# Patient Record
Sex: Female | Born: 1990 | Race: White | Hispanic: No | Smoking: Former smoker
Health system: Southern US, Community
[De-identification: ages and names within clinical notes are randomized; demographics above are authoritative.]

## PROBLEM LIST (undated history)

## (undated) DIAGNOSIS — F32A Depression, unspecified: Secondary | ICD-10-CM

## (undated) DIAGNOSIS — F419 Anxiety disorder, unspecified: Secondary | ICD-10-CM

## (undated) DIAGNOSIS — F329 Major depressive disorder, single episode, unspecified: Secondary | ICD-10-CM

## (undated) HISTORY — DX: Anxiety disorder, unspecified: F41.9

## (undated) HISTORY — DX: Major depressive disorder, single episode, unspecified: F32.9

## (undated) HISTORY — DX: Depression, unspecified: F32.A

---
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
## 2012-04-13 ENCOUNTER — Emergency Department: Payer: Self-pay | Admitting: Emergency Medicine

## 2012-04-13 LAB — URINALYSIS, COMPLETE
Bilirubin,UR: NEGATIVE
Blood: NEGATIVE
Glucose,UR: NEGATIVE mg/dL (ref 0–75)
Ketone: NEGATIVE
Nitrite: NEGATIVE
Ph: 6 (ref 4.5–8.0)
RBC,UR: <1 /HPF (ref 0–5)
WBC UR: <1 /HPF (ref 0–5)

## 2012-04-13 LAB — BASIC METABOLIC PANEL
Anion Gap: 7 (ref 7–16)
BUN: 8 mg/dL (ref 7–18)
Calcium, Total: 9.3 mg/dL (ref 8.5–10.1)
Chloride: 107 mmol/L (ref 98–107)
EGFR (Non-African Amer.): >60
Potassium: 4.2 mmol/L (ref 3.5–5.1)
Sodium: 140 mmol/L (ref 136–145)

## 2012-04-13 LAB — PREGNANCY, URINE: Pregnancy Test, Urine: NEGATIVE m[IU]/mL

## 2012-04-13 LAB — CBC
HCT: 43.4 % (ref 35.0–47.0)
MCV: 89 fL (ref 80–100)
Platelet: 253 10*3/uL (ref 150–440)
WBC: 5.7 10*3/uL (ref 3.6–11.0)

## 2014-06-06 DIAGNOSIS — Z789 Other specified health status: Secondary | ICD-10-CM | POA: Insufficient documentation

## 2014-10-23 ENCOUNTER — Observation Stay: Payer: Self-pay | Admitting: Obstetrics and Gynecology

## 2014-10-23 LAB — URINALYSIS, COMPLETE
BLOOD: NEGATIVE
Bilirubin,UR: NEGATIVE
GLUCOSE, UR: NEGATIVE mg/dL (ref 0–75)
KETONE: NEGATIVE
Leukocyte Esterase: NEGATIVE
Nitrite: NEGATIVE
PH: 6 (ref 4.5–8.0)
SPECIFIC GRAVITY: 1.025 (ref 1.003–1.030)
Squamous Epithelial: 2
WBC UR: 2 /HPF (ref 0–5)

## 2014-10-23 LAB — FETAL FIBRONECTIN
APPEARANCE: NORMAL
FETAL FIBRONECTIN: NEGATIVE

## 2014-12-20 ENCOUNTER — Inpatient Hospital Stay: Payer: Self-pay

## 2014-12-20 LAB — CBC WITH DIFFERENTIAL/PLATELET
BASOS ABS: 0 10*3/uL (ref 0.0–0.1)
BASOS PCT: 0.3 %
Eosinophil #: 0.1 10*3/uL (ref 0.0–0.7)
Eosinophil %: 0.9 %
HCT: 36 % (ref 35.0–47.0)
HGB: 12.6 g/dL (ref 12.0–16.0)
Lymphocyte #: 1.7 10*3/uL (ref 1.0–3.6)
Lymphocyte %: 20.5 %
MCH: 30.2 pg (ref 26.0–34.0)
MCHC: 35.1 g/dL (ref 32.0–36.0)
MCV: 86 fL (ref 80–100)
Monocyte #: 0.5 x10 3/mm (ref 0.2–0.9)
Monocyte %: 6.6 %
NEUTROS ABS: 5.8 10*3/uL (ref 1.4–6.5)
Neutrophil %: 71.7 %
Platelet: 213 10*3/uL (ref 150–440)
RBC: 4.18 10*6/uL (ref 3.80–5.20)
RDW: 13.9 % (ref 11.5–14.5)
WBC: 8.1 10*3/uL (ref 3.6–11.0)

## 2014-12-21 LAB — HEMOGLOBIN: HGB: 10.8 g/dL — AB (ref 12.0–16.0)

## 2014-12-22 LAB — HEMATOCRIT: HCT: 32.8 % — ABNORMAL LOW (ref 35.0–47.0)

## 2015-03-25 NOTE — H&P (Signed)
L&D Evaluation:  History:  HPI 24 y/o G1 @ 40/2wks here with c/o SROM clear 0900 this am, occasional mild uc's, no bloody show, baby is active. Well pregnancy, [facility] GBS negative.   Presents with contractions, leaking fluid   Patient's Medical History No Chronic Illness   Patient's Surgical History none   Medications Pre Natal Vitamins   Allergies NKDA   Social History none   Family History Non-Contributory   ROS:  ROS All systems were reviewed.  HEENT, CNS, GI, GU, Respiratory, CV, Renal and Musculoskeletal systems were found to be normal.   Exam:  Vital Signs stable   Urine Protein not completed   General no apparent distress   Mental Status clear   Chest clear   Heart normal sinus rhythm   Abdomen gravid, non-tender   Estimated Fetal Weight Average for gestational age   Fetal Position vtx   Fundal Height term   Back no CVAT   Edema no edema   Reflexes 2+   Clonus negative   Pelvic no external lesions   Mebranes Ruptured   Description clear   FHT normal rate with no decels, baseline 130's 140's   Fetal Heart Rate 144   Ucx irregular, mild   Skin dry   Impression:  Impression early labor   Plan:  Plan EFM/NST, monitor contractions and for cervical change   Comments Admitted, explained what to expect with first baby. Family, partner supportive at bedside. Plans epidural with labor progress.   Electronic Signatures: Rosie Fate (CNM)  (Signed 05-Feb-16 14:06)  Authored: L&D Evaluation   Last Updated: 05-Feb-16 14:06 by Rosie Fate (CNM)

## 2015-10-29 ENCOUNTER — Encounter: Payer: Self-pay | Admitting: Physician Assistant

## 2015-10-29 ENCOUNTER — Ambulatory Visit (INDEPENDENT_AMBULATORY_CARE_PROVIDER_SITE_OTHER): Payer: 59 | Admitting: Physician Assistant

## 2015-10-29 VITALS — BP 104/70 | HR 82 | Temp 98.6°F | Resp 16 | Ht 68.0 in | Wt 216.2 lb

## 2015-10-29 DIAGNOSIS — F329 Major depressive disorder, single episode, unspecified: Secondary | ICD-10-CM | POA: Diagnosis not present

## 2015-10-29 DIAGNOSIS — Z7689 Persons encountering health services in other specified circumstances: Secondary | ICD-10-CM

## 2015-10-29 DIAGNOSIS — F419 Anxiety disorder, unspecified: Secondary | ICD-10-CM

## 2015-10-29 DIAGNOSIS — Z7189 Other specified counseling: Secondary | ICD-10-CM

## 2015-10-29 DIAGNOSIS — F32A Depression, unspecified: Secondary | ICD-10-CM

## 2015-10-29 MED ORDER — BUPROPION HCL ER (SR) 150 MG PO TB12: ORAL_TABLET | ORAL | Status: DC

## 2015-10-29 MED ORDER — ALPRAZOLAM 0.25 MG PO TABS: 0.2500 mg | ORAL_TABLET | Freq: Three times a day (TID) | ORAL | Status: DC | PRN

## 2015-10-29 NOTE — Patient Instructions (Signed)
Major Depressive Disorder Major depressive disorder is a mental illness. It also may be called clinical depression or unipolar depression. Major depressive disorder usually causes feelings of sadness, hopelessness, or helplessness. Some people with this disorder do not feel particularly sad but lose interest in doing things they used to enjoy (anhedonia). Major depressive disorder also can cause physical symptoms. It can interfere with work, school, relationships, and other normal everyday activities. The disorder varies in severity but is longer lasting and more serious than the sadness we all feel from time to time in our lives. Major depressive disorder often is triggered by stressful life events or major life changes. Examples of these triggers include divorce, loss of your job or home, a move, and the death of a family member or close friend. Sometimes this disorder occurs for no obvious reason at all. People who have family members with major depressive disorder or bipolar disorder are at higher risk for developing this disorder, with or without life stressors. Major depressive disorder can occur at any age. It may occur just once in your life (single episode major depressive disorder). It may occur multiple times (recurrent major depressive disorder). SYMPTOMS People with major depressive disorder have either anhedonia or depressed mood on nearly a daily basis for at least 2 weeks or longer. Symptoms of depressed mood include:  Feelings of sadness (blue or down in the dumps) or emptiness.  Feelings of hopelessness or helplessness.  Tearfulness or episodes of crying (may be observed by others).  Irritability (children and adolescents). In addition to depressed mood or anhedonia or both, people with this disorder have at least four of the following symptoms:  Difficulty sleeping or sleeping too much.   Significant change (increase or decrease) in appetite or weight.   Lack of energy or  motivation.  Feelings of guilt and worthlessness.   Difficulty concentrating, remembering, or making decisions.  Unusually slow movement (psychomotor retardation) or restlessness (as observed by others).   Recurrent wishes for death, recurrent thoughts of self-harm (suicide), or a suicide attempt. People with major depressive disorder commonly have persistent negative thoughts about themselves, other people, and the world. People with severe major depressive disorder may experiencedistorted beliefs or perceptions about the world (psychotic delusions). They also may see or hear things that are not real (psychotic hallucinations). DIAGNOSIS Major depressive disorder is diagnosed through an assessment by your health care provider. Your health care provider will ask aboutaspects of your daily life, such as mood,sleep, and appetite, to see if you have the diagnostic symptoms of major depressive disorder. Your health care provider may ask about your medical history and use of alcohol or drugs, including prescription medicines. Your health care provider also may do a physical exam and blood work. This is because certain medical conditions and the use of certain substances can cause major depressive disorder-like symptoms (secondary depression). Your health care provider also may refer you to a mental health specialist for further evaluation and treatment. TREATMENT It is important to recognize the symptoms of major depressive disorder and seek treatment. The following treatments can be prescribed for this disorder:   Medicine. Antidepressant medicines usually are prescribed. Antidepressant medicines are thought to correct chemical imbalances in the brain that are commonly associated with major depressive disorder. Other types of medicine may be added if the symptoms do not respond to antidepressant medicines alone or if psychotic delusions or hallucinations occur.  Talk therapy. Talk therapy can be  helpful in treating major depressive disorder by providing   support, education, and guidance. Certain types of talk therapy also can help with negative thinking (cognitive behavioral therapy) and with relationship issues that trigger this disorder (interpersonal therapy). A mental health specialist can help determine which treatment is best for you. Most people with major depressive disorder do well with a combination of medicine and talk therapy. Treatments involving electrical stimulation of the brain can be used in situations with extremely severe symptoms or when medicine and talk therapy do not work over time. These treatments include electroconvulsive therapy, transcranial magnetic stimulation, and vagal nerve stimulation.   This information is not intended to replace advice given to you by your health care provider. Make sure you discuss any questions you have with your health care provider.   Document Released: 02/26/2013 Document Revised: 11/22/2014 Document Reviewed: 02/26/2013 Elsevier Interactive Patient Education 2016 [employer].   Bupropion sustained-release tablets (Depression/Mood Disorders) What is this medicine? BUPROPION (byoo PROE pee on) is used to treat depression. This medicine may be used for other purposes; ask your health care provider or pharmacist if you have questions. What should I tell my health care provider before I take this medicine? They need to know if you have any of these conditions: -an eating disorder, such as anorexia or bulimia -bipolar disorder or psychosis -diabetes or high blood sugar, treated with medication -glaucoma -head injury or brain tumor -heart disease, previous heart attack, or irregular heart beat -high blood pressure -kidney or liver disease -seizures -suicidal thoughts or a previous suicide attempt -Tourette's syndrome -weight loss -an unusual or allergic reaction to bupropion, other medicines, foods, dyes, or  preservatives -breast-feeding -pregnant or trying to become pregnant How should I use this medicine? Take this medicine by mouth with a glass of water. Follow the directions on the prescription label. You can take it with or without food. If it upsets your stomach, take it with food. Do not cut, crush or chew this medicine. Take your medicine at regular intervals. If you take this medicine more than once a day, take your second dose at least 8 hours after you take your first dose. To limit difficulty in sleeping, avoid taking this medicine at bedtime. Do not take your medicine more often than directed. Do not stop taking this medicine suddenly except upon the advice of your doctor. Stopping this medicine too quickly may cause serious side effects or your condition may worsen. A special MedGuide will be given to you by the pharmacist with each prescription and refill. Be sure to read this information carefully each time. Talk to your pediatrician regarding the use of this medicine in children. Special care may be needed. Overdosage: If you think you have taken too much of this medicine contact a poison control center or emergency room at once. NOTE: This medicine is only for you. Do not share this medicine with others. What if I miss a dose? If you miss a dose, skip the missed dose and take your next tablet at the regular time. There should be at least 8 hours between doses. Do not take double or extra doses. What may interact with this medicine? Do not take this medicine with any of the following medications: -linezolid -MAOIs like Azilect, Carbex, Eldepryl, Marplan, Nardil, and Parnate -methylene blue (injected into a vein) -other medicines that contain bupropion like Zyban This medicine may also interact with the following medications: -alcohol -certain medicines for anxiety or sleep -certain medicines for blood pressure like metoprolol, propranolol -certain medicines for depression or  psychotic  disturbances -certain medicines for HIV or AIDS like efavirenz, lopinavir, nelfinavir, ritonavir -certain medicines for irregular heart beat like propafenone, flecainide -certain medicines for Parkinson's disease like amantadine, levodopa -certain medicines for seizures like carbamazepine, phenytoin, phenobarbital -cimetidine -clopidogrel -cyclophosphamide -furazolidone -isoniazid -nicotine -orphenadrine -procarbazine -steroid medicines like prednisone or cortisone -stimulant medicines for attention disorders, weight loss, or to stay awake -tamoxifen -theophylline -thiotepa -ticlopidine -tramadol -warfarin This list may not describe all possible interactions. Give your health care provider a list of all the medicines, herbs, non-prescription drugs, or dietary supplements you use. Also tell them if you smoke, drink alcohol, or use illegal drugs. Some items may interact with your medicine. What should I watch for while using this medicine? Tell your doctor if your symptoms do not get better or if they get worse. Visit your doctor or health care professional for regular checks on your progress. Because it may take several weeks to see the full effects of this medicine, it is important to continue your treatment as prescribed by your doctor. Patients and their families should watch out for new or worsening thoughts of suicide or depression. Also watch out for sudden changes in feelings such as feeling anxious, agitated, panicky, irritable, hostile, aggressive, impulsive, severely restless, overly excited and hyperactive, or not being able to sleep. If this happens, especially at the beginning of treatment or after a change in dose, call your health care professional. Avoid alcoholic drinks while taking this medicine. Drinking excessive alcoholic beverages, using sleeping or anxiety medicines, or quickly stopping the use of these agents while taking this medicine may increase your risk  for a seizure. Do not drive or use heavy machinery until you know how this medicine affects you. This medicine can impair your ability to perform these tasks. Do not take this medicine close to bedtime. It may prevent you from sleeping. Your mouth may get dry. Chewing sugarless gum or sucking hard candy, and drinking plenty of water may help. Contact your doctor if the problem does not go away or is severe. What side effects may I notice from receiving this medicine? Side effects that you should report to your doctor or health care professional as soon as possible: -allergic reactions like skin rash, itching or hives, swelling of the face, lips, or tongue -breathing problems -changes in vision -confusion -fast or irregular heartbeat -hallucinations -increased blood pressure -redness, blistering, peeling or loosening of the skin, including inside the mouth -seizures -suicidal thoughts or other mood changes -unusually weak or tired -vomiting Side effects that usually do not require medical attention (report to your doctor or health care professional if they continue or are bothersome): -change in sex drive or performance -constipation -headache -loss of appetite -nausea -tremors -weight loss This list may not describe all possible side effects. Call your doctor for medical advice about side effects. You may report side effects to FDA at 1-800-FDA-1088. Where should I keep my medicine? Keep out of the reach of children. Store at room temperature between 20 and 25 degrees C (68 and 77 degrees F), away from direct sunlight and moisture. Keep tightly closed. Throw away any unused medicine after the expiration date. NOTE: This sheet is a summary. It may not cover all possible information. If you have questions about this medicine, talk to your doctor, pharmacist, or health care provider.    2016, Elsevier/Gold Standard. (2013-05-25 12:41:10)  Alprazolam tablets What is this  medicine? ALPRAZOLAM (al PRAY zoe lam) is a benzodiazepine. It is used to treat anxiety  and panic attacks. This medicine may be used for other purposes; ask your health care provider or pharmacist if you have questions. What should I tell my health care provider before I take this medicine? They need to know if you have any of these conditions: -an alcohol or drug abuse problem -bipolar disorder, depression, psychosis or other mental health conditions -glaucoma -kidney or liver disease -lung or breathing disease -myasthenia gravis -Parkinson's disease -porphyria -seizures or a history of seizures -suicidal thoughts -an unusual or allergic reaction to alprazolam, other benzodiazepines, foods, dyes, or preservatives -pregnant or trying to get pregnant -breast-feeding How should I use this medicine? Take this medicine by mouth with a glass of water. Follow the directions on the prescription label. Take your medicine at regular intervals. Do not take it more often than directed. If you have been taking this medicine regularly for some time, do not suddenly stop taking it. You must gradually reduce the dose or you may get severe side effects. Ask your doctor or health care professional for advice. Even after you stop taking this medicine it can still affect your body for several days. Talk to your pediatrician regarding the use of this medicine in children. Special care may be needed. Overdosage: If you think you have taken too much of this medicine contact a poison control center or emergency room at once. NOTE: This medicine is only for you. Do not share this medicine with others. What if I miss a dose? If you miss a dose, take it as soon as you can. If it is almost time for your next dose, take only that dose. Do not take double or extra doses. What may interact with this medicine? Do not take this medicine with any of the following medications: -certain medicines for HIV infection or  AIDS -ketoconazole -itraconazole This medicine may also interact with the following medications: -birth control pills -certain macrolide antibiotics like clarithromycin, erythromycin, troleandomycin -cimetidine -cyclosporine -ergotamine -grapefruit juice -herbal or dietary supplements like kava kava, melatonin, dehydroepiandrosterone, DHEA, St. John's Wort or valerian -imatinib, STI-571 -isoniazid -levodopa -medicines for depression, anxiety, or psychotic disturbances -prescription pain medicines -rifampin, rifapentine, or rifabutin -some medicines for blood pressure or heart problems -some medicines for seizures like carbamazepine, oxcarbazepine, phenobarbital, phenytoin, primidone This list may not describe all possible interactions. Give your health care provider a list of all the medicines, herbs, non-prescription drugs, or dietary supplements you use. Also tell them if you smoke, drink alcohol, or use illegal drugs. Some items may interact with your medicine. What should I watch for while using this medicine? Visit your doctor or health care professional for regular checks on your progress. Your body can become dependent on this medicine. Ask your doctor or health care professional if you still need to take it. You may get drowsy or dizzy. Do not drive, use machinery, or do anything that needs mental alertness until you know how this medicine affects you. To reduce the risk of dizzy and fainting spells, do not stand or sit up quickly, especially if you are an older patient. Alcohol may increase dizziness and drowsiness. Avoid alcoholic drinks. Do not treat yourself for coughs, colds or allergies without asking your doctor or health care professional for advice. Some ingredients can increase possible side effects. What side effects may I notice from receiving this medicine? Side effects that you should report to your doctor or health care professional as soon as possible: -allergic  reactions like skin rash, itching or hives, swelling of  the face, lips, or tongue -confusion, forgetfulness -depression -difficulty sleeping -difficulty speaking -feeling faint or lightheaded, falls -mood changes, excitability or aggressive behavior -muscle cramps -trouble passing urine or change in the amount of urine -unusually weak or tired Side effects that usually do not require medical attention (report to your doctor or health care professional if they continue or are bothersome): -change in sex drive or performance -changes in appetite This list may not describe all possible side effects. Call your doctor for medical advice about side effects. You may report side effects to FDA at 1-800-FDA-1088. Where should I keep my medicine? Keep out of the reach of children. This medicine can be abused. Keep your medicine in a safe place to protect it from theft. Do not share this medicine with anyone. Selling or giving away this medicine is dangerous and against the law. Store at room temperature between 20 and 25 degrees C (68 and 77 degrees F). This medicine may cause accidental overdose and death if taken by other adults, children, or pets. Mix any unused medicine with a substance like cat litter or coffee grounds. Then throw the medicine away in a sealed container like a sealed bag or a coffee can with a lid. Do not use the medicine after the expiration date. NOTE: This sheet is a summary. It may not cover all possible information. If you have questions about this medicine, talk to your doctor, pharmacist, or health care provider.    2016, Elsevier/Gold Standard. (2014-07-23 14:51:36)

## 2015-10-29 NOTE — Progress Notes (Signed)
Patient: Cynthia Glover, Female    DOB: 1991-09-29, 24 y.o.   MRN: [id number] Visit Date: 10/29/2015  Today's Provider: Mar Daring, PA-C   Chief Complaint  Patient presents with  . Establish Care  . Depression   Subjective:    Establish Care:  Cynthia Glover is a 24 y.o. female who presents today for health maintenance and complete physical. She feels fairly well. She reports exercising, tries to walk on her lunch break, patient reports have gain weight since her she had her daughter. She reports she is sleeping fairly well. Patient previous doctor [facility] (pregnancy). Per patient had her pap this year (2016) and her Tetanus Vaccine. Per patient reason that schedule the appointment today was because has been feeling anxious. Doesn't know if it is depression or anxiety.  Anxiety: Patient complains of sleep disturbance.  She has the following symptoms: fatigue,Irritability,overeating, more nervous and anxious. Onset of symptoms was approximately 10 months ago, gradually worsening since that time. She denies current suicidal and homicidal ideation. Family history significant for depression.Possible organic causes contributing are: none. Risk factors: positive family history in  brother(s) and mother. Previous treatment includes none and none.   Depression: Patient complains of depression. She complains of difficulty concentrating. Onset was approximately 10 months ago, gradually worsening since that time.  She denies current suicidal and homicidal plan or intent.   Family history significant for depression.Possible organic causes contributing are: none.  Risk factors: positive family history in  mother and brother. Previous treatment includes no treatments and none.Patient complains have been having headaches every day does not know if this contributing on how she feels. Patient states just wants to stay in bed. Having difficult at work because she can't  concentrate.  Review of Systems  Constitutional: Positive for fatigue and unexpected weight change (weight gain since pregnancy). Negative for chills.       Irritability  HENT: Negative for congestion, ear pain, postnasal drip, rhinorrhea, sinus pressure, sneezing, sore throat, tinnitus and voice change.   Eyes: Negative.   Respiratory: Negative.   Cardiovascular: Negative.   Gastrointestinal: Negative.   Endocrine: Positive for polyphagia.  Genitourinary: Negative.   Musculoskeletal: Positive for back pain and neck pain.  Allergic/Immunologic: Negative.   Neurological: Positive for headaches (tension-type). Negative for dizziness, weakness and numbness.  Hematological: Negative.   Psychiatric/Behavioral: Positive for sleep disturbance (wants to sleep all the time), decreased concentration and agitation. Negative for suicidal ideas and self-injury. The patient is nervous/anxious.     Social History      She  reports that she has quit smoking. She has never used smokeless tobacco. She reports that she does not drink alcohol or use illicit drugs.       Social History   Social History  . Marital Status: Single    Spouse Name: N/A  . Number of Children: N/A  . Years of Education: N/A   Social History Main Topics  . Smoking status: Former Research scientist (life sciences)  . Smokeless tobacco: Never Used     Comment: Quit in 2015. Smoked for 5 years.  . Alcohol Use: No  . Drug Use: No  . Sexual Activity: Not Asked   Other Topics Concern  . None   Social History Narrative  . None    Patient Active Problem List   Diagnosis Date Noted  . Not immune to rubella 06/06/2014    History reviewed. No pertinent past surgical history.  Family History  Family Status  Relation Status Death Age  . Mother Alive   . Father Alive   . Sister Alive   . Brother Alive         Her family history includes Depression in her brother and father; Diabetes in her mother.    Allergies  Allergen Reactions  .  Penicillins Rash    Previous Medications   LEVONORGESTREL (MIRENA, 52 MG,) 20 MCG/24HR IUD    1 each by Intrauterine route once.    Patient Care Team: Mar Daring, PA-C as PCP - General (Family Medicine)     Objective:   Vitals: BP 104/70 mmHg  Pulse 82  Temp(Src) 98.6 F (37 C) (Oral)  Resp 16  Ht 5\' 8"  (1.727 m)  Wt 216 lb 3.2 oz (98.068 kg)  BMI 32.88 kg/m2   Physical Exam  Constitutional: She is oriented to person, place, and time. She appears well-developed and well-nourished. No distress.  HENT:  Head: Normocephalic and atraumatic.  Right Ear: External ear normal.  Left Ear: External ear normal.  Nose: Nose normal.  Mouth/Throat: Oropharynx is clear and moist. No oropharyngeal exudate.  Eyes: Conjunctivae and EOM are normal. Pupils are equal, round, and reactive to light. Right eye exhibits no discharge. Left eye exhibits no discharge. No scleral icterus.  Neck: Normal range of motion. Neck supple. No JVD present. No tracheal deviation present. No thyromegaly present.  Cardiovascular: Normal rate, regular rhythm, normal heart sounds and intact distal pulses.  Exam reveals no gallop and no friction rub.   No murmur heard. Pulmonary/Chest: Effort normal and breath sounds normal. No respiratory distress. She has no wheezes. She has no rales. She exhibits no tenderness.  Abdominal: Soft. Bowel sounds are normal. She exhibits no distension and no mass. There is no tenderness. There is no rebound and no guarding.  Musculoskeletal: Normal range of motion. She exhibits no edema or tenderness.  Lymphadenopathy:    She has no cervical adenopathy.  Neurological: She is alert and oriented to person, place, and time.  Skin: Skin is warm and dry. No rash noted. She is not diaphoretic.  Psychiatric: Her speech is normal and behavior is normal. Judgment and thought content normal. Her mood appears not anxious. Cognition and memory are normal. She exhibits a depressed mood.   Vitals reviewed.    Depression Screen PHQ 2/9 Scores 10/29/2015  PHQ - 2 Score 2  PHQ- 9 Score 15      Assessment & Plan:     Routine Health Maintenance and Physical Exam  1. Establishing care with new doctor, encounter for Previous primary care was [facility]  OB/GYN. This was up until her first postnatal visit following the birth of her daughter 10 months ago. She did have her Pap smear and breast exam at that time and it was reported normal.  2. Depression Worsening over the last 10 months. This could most likely be a postpartum depression since it is still within the 1 year of birth of her daughter. She states she does feel like she has bonded with her daughter and that heard her daughter have a good relationship. She has however noticed having more irritation at her daughter when she does something wrong and also at her husband. I will treat her with Wellbutrin as below. I will follow-up with her in 4 weeks to see how she is doing. She is to call the office if she has any worsening symptoms, adverse reactions to the medication, questions or concerns. - buPROPion (  WELLBUTRIN SR) 150 MG 12 hr tablet; Take one tablet PO daily x 1 week; then take 1 tablet PO BID daily thereafter  Dispense: 60 tablet; Refill: 1  3. Acute anxiety Worsening over the last 10 months since the birth of her daughter. She does have positive family history for anxiety in her mother and her brother. She has never taken anything for anxiety. She states her anxiety symptoms are most likely upset stomach, nervous "knots" in the stomach and sometimes nausea and vomiting. This does not occur daily. She does however state she may have 3-4 of these episodes a week. I will give her alprazolam as below. She may take this as much as 3 times per day as needed for any of these anxiety attacks. She is to call the office if she has any worsening symptoms , adverse reactions to the medication , questions or concerns. I will  follow-up with her in 4 weeks to reevaluate how she is doing at that time. - ALPRAZolam (XANAX) 0.25 MG tablet; Take 1 tablet (0.25 mg total) by mouth 3 (three) times daily as needed for anxiety.  Dispense: 45 tablet; Refill: 1  Exercise Activities and Dietary recommendations Goals    None       There is no immunization history on file for this patient.  Health Maintenance  Topic Date Due  . HIV Screening  09/17/2004  . TETANUS/TDAP  09/17/2008  . PAP SMEAR  09/17/2010  . INFLUENZA VACCINE  06/16/2015      Discussed health benefits of physical activity, and encouraged her to engage in regular exercise appropriate for her age and condition.    --------------------------------------------------------------------

## 2015-11-26 ENCOUNTER — Ambulatory Visit (INDEPENDENT_AMBULATORY_CARE_PROVIDER_SITE_OTHER): Payer: 59 | Admitting: Physician Assistant

## 2015-11-26 ENCOUNTER — Encounter: Payer: Self-pay | Admitting: Physician Assistant

## 2015-11-26 VITALS — BP 108/70 | HR 78 | Temp 98.6°F | Resp 16 | Wt 219.4 lb

## 2015-11-26 DIAGNOSIS — E669 Obesity, unspecified: Secondary | ICD-10-CM | POA: Diagnosis not present

## 2015-11-26 DIAGNOSIS — F32A Depression, unspecified: Secondary | ICD-10-CM

## 2015-11-26 DIAGNOSIS — F329 Major depressive disorder, single episode, unspecified: Secondary | ICD-10-CM | POA: Diagnosis not present

## 2015-11-26 DIAGNOSIS — F419 Anxiety disorder, unspecified: Secondary | ICD-10-CM

## 2015-11-26 DIAGNOSIS — Z6833 Body mass index (BMI) 33.0-33.9, adult: Secondary | ICD-10-CM

## 2015-11-26 MED ORDER — ALPRAZOLAM 0.5 MG PO TABS: 0.2500 mg | ORAL_TABLET | Freq: Three times a day (TID) | ORAL | Status: DC | PRN

## 2015-11-26 MED ORDER — PHENTERMINE HCL 30 MG PO CAPS: 30.0000 mg | ORAL_CAPSULE | ORAL | Status: DC

## 2015-11-26 NOTE — Patient Instructions (Signed)
Calorie Counting for Weight Loss Calories are energy you get from the things you eat and drink. Your body uses this energy to keep you going throughout the day. The number of calories you eat affects your weight. When you eat more calories than your body needs, your body stores the extra calories as fat. When you eat fewer calories than your body needs, your body burns fat to get the energy it needs. Calorie counting means keeping track of how many calories you eat and drink each day. If you make sure to eat fewer calories than your body needs, you should lose weight. In order for calorie counting to work, you will need to eat the number of calories that are right for you in a day to lose a healthy amount of weight per week. A healthy amount of weight to lose per week is usually 1-2 lb (0.5-0.9 kg). A dietitian can determine how many calories you need in a day and give you suggestions on how to reach your calorie goal.  WHAT IS MY MY PLAN? My goal is to have 1200-1400 calories per day.  If I have this many calories per day, I should lose around 1-2 pounds per week. WHAT DO I NEED TO KNOW ABOUT CALORIE COUNTING? In order to meet your daily calorie goal, you will need to:  Find out how many calories are in each food you would like to eat. Try to do this before you eat.  Decide how much of the food you can eat.  Write down what you ate and how many calories it had. Doing this is called keeping a food log. WHERE DO I FIND CALORIE INFORMATION? The number of calories in a food can be found on a Nutrition Facts label. Note that all the information on a label is based on a specific serving of the food. If a food does not have a Nutrition Facts label, try to look up the calories online or ask your dietitian for help. HOW DO I DECIDE HOW MUCH TO EAT? To decide how much of the food you can eat, you will need to consider both the number of calories in one serving and the size of one serving. This information  can be found on the Nutrition Facts label. If a food does not have a Nutrition Facts label, look up the information online or ask your dietitian for help. Remember that calories are listed per serving. If you choose to have more than one serving of a food, you will have to multiply the calories per serving by the amount of servings you plan to eat. For example, the label on a package of bread might say that a serving size is 1 slice and that there are 90 calories in a serving. If you eat 1 slice, you will have eaten 90 calories. If you eat 2 slices, you will have eaten 180 calories. HOW DO I KEEP A FOOD LOG? After each meal, record the following information in your food log:  What you ate.  How much of it you ate.  How many calories it had.  Then, add up your calories. Keep your food log near you, such as in a small notebook in your pocket. Another option is to use a mobile app or website. Some programs will calculate calories for you and show you how many calories you have left each time you add an item to the log. WHAT ARE SOME CALORIE COUNTING TIPS?  Use your calories on foods  and drinks that will fill you up and not leave you hungry. Some examples of this include foods like nuts and nut butters, vegetables, lean proteins, and high-fiber foods (more than 5 g fiber per serving).  Eat nutritious foods and avoid empty calories. Empty calories are calories you get from foods or beverages that do not have many nutrients, such as candy and soda. It is better to have a nutritious high-calorie food (such as an avocado) than a food with few nutrients (such as a bag of chips).  Know how many calories are in the foods you eat most often. This way, you do not have to look up how many calories they have each time you eat them.  Look out for foods that may seem like low-calorie foods but are really high-calorie foods, such as baked goods, soda, and fat-free candy.  Pay attention to calories in drinks.  Drinks such as sodas, specialty coffee drinks, alcohol, and juices have a lot of calories yet do not fill you up. Choose low-calorie drinks like water and diet drinks.  Focus your calorie counting efforts on higher calorie items. Logging the calories in a garden salad that contains only vegetables is less important than calculating the calories in a milk shake.  Find a way of tracking calories that works for you. Get creative. Most people who are successful find ways to keep track of how much they eat in a day, even if they do not count every calorie. WHAT ARE SOME PORTION CONTROL TIPS?  Know how many calories are in a serving. This will help you know how many servings of a certain food you can have.  Use a measuring cup to measure serving sizes. This is helpful when you start out. With time, you will be able to estimate serving sizes for some foods.  Take some time to put servings of different foods on your favorite plates, bowls, and cups so you know what a serving looks like.  Try not to eat straight from a bag or box. Doing this can lead to overeating. Put the amount you would like to eat in a cup or on a plate to make sure you are eating the right portion.  Use smaller plates, glasses, and bowls to prevent overeating. This is a quick and easy way to practice portion control. If your plate is smaller, less food can fit on it.  Try not to multitask while eating, such as watching TV or using your computer. If it is time to eat, sit down at a table and enjoy your food. Doing this will help you to start recognizing when you are full. It will also make you more aware of what and how much you are eating. HOW CAN I CALORIE COUNT WHEN EATING OUT?  Ask for smaller portion sizes or child-sized portions.  Consider sharing an entree and sides instead of getting your own entree.  If you get your own entree, eat only half. Ask for a box at the beginning of your meal and put the rest of your entree in  it so you are not tempted to eat it.  Look for the calories on the menu. If calories are listed, choose the lower calorie options.  Choose dishes that include vegetables, fruits, whole grains, low-fat dairy products, and lean protein. Focusing on smart food choices from each of the 5 food groups can help you stay on track at restaurants.  Choose items that are boiled, broiled, grilled, or steamed.  Choose  water, milk, unsweetened iced tea, or other drinks without added sugars. If you want an alcoholic beverage, choose a lower calorie option. For example, a regular margarita can have up to 700 calories and a glass of wine has around 150.  Stay away from items that are buttered, battered, fried, or served with cream sauce. Items labeled "crispy" are usually fried, unless stated otherwise.  Ask for dressings, sauces, and syrups on the side. These are usually very high in calories, so do not eat much of them.  Watch out for salads. Many people think salads are a healthy option, but this is often not the case. Many salads come with bacon, fried chicken, lots of cheese, fried chips, and dressing. All of these items have a lot of calories. If you want a salad, choose a garden salad and ask for grilled meats or steak. Ask for the dressing on the side, or ask for olive oil and vinegar or lemon to use as dressing.  Estimate how many servings of a food you are given. For example, a serving of cooked rice is  cup or about the size of half a tennis ball or one cupcake wrapper. Knowing serving sizes will help you be aware of how much food you are eating at restaurants. The list below tells you how big or small some common portion sizes are based on everyday objects.  1 oz--4 stacked dice.  3 oz--1 deck of cards.  1 tsp--1 dice.  1 Tbsp-- a Ping-Pong ball.  2 Tbsp--1 Ping-Pong ball.   cup--1 tennis ball or 1 cupcake wrapper.  1 cup--1 baseball.   This information is not intended to replace advice  given to you by your health care provider. Make sure you discuss any questions you have with your health care provider.   Document Released: 11/01/2005 Document Revised: 11/22/2014 Document Reviewed: 09/06/2013 Elsevier Interactive Patient Education 2016 [employer] Exercising to Lose Weight Exercising can help you to lose weight. In order to lose weight through exercise, you need to do vigorous-intensity exercise. You can tell that you are exercising with vigorous intensity if you are breathing very hard and fast and cannot hold a conversation while exercising. Moderate-intensity exercise helps to maintain your current weight. You can tell that you are exercising at a moderate level if you have a higher heart rate and faster breathing, but you are still able to hold a conversation. HOW OFTEN SHOULD I EXERCISE? Choose an activity that you enjoy and set realistic goals. Your health care provider can help you to make an activity plan that works for you. Exercise regularly as directed by your health care provider. This may include:  Doing resistance training twice each week, such as:  Push-ups.  Sit-ups.  Lifting weights.  Using resistance bands.  Doing a given intensity of exercise for a given amount of time. Choose from these options:  150 minutes of moderate-intensity exercise every week.  75 minutes of vigorous-intensity exercise every week.  A mix of moderate-intensity and vigorous-intensity exercise every week. Children, pregnant women, people who are out of shape, people who are overweight, and older adults may need to consult a health care provider for individual recommendations. If you have any sort of medical condition, be sure to consult your health care provider before starting a new exercise program. WHAT ARE SOME ACTIVITIES THAT CAN HELP ME TO LOSE WEIGHT?   Walking at a rate of at least 4.5 miles an hour.  Jogging or running at a rate of   5 miles per hour.  Biking at a  rate of at least 10 miles per hour.  Lap swimming.  Roller-skating or in-line skating.  Cross-country skiing.  Vigorous competitive sports, such as football, basketball, and soccer.  Jumping rope.  Aerobic dancing. HOW CAN I BE MORE ACTIVE IN MY DAY-TO-DAY ACTIVITIES?  Use the stairs instead of the elevator.  Take a walk during your lunch break.  If you drive, park your car farther away from work or school.  If you take public transportation, get off one stop early and walk the rest of the way.  Make all of your phone calls while standing up and walking around.  Get up, stretch, and walk around every 30 minutes throughout the day. WHAT GUIDELINES SHOULD I FOLLOW WHILE EXERCISING?  Do not exercise so much that you hurt yourself, feel dizzy, or get very short of breath.  Consult your health care provider prior to starting a new exercise program.  Wear comfortable clothes and shoes with good support.  Drink plenty of water while you exercise to prevent dehydration or heat stroke. Body water is lost during exercise and must be replaced.  Work out until you breathe faster and your heart beats faster.   This information is not intended to replace advice given to you by your health care provider. Make sure you discuss any questions you have with your health care provider.   Document Released: 12/04/2010 Document Revised: 11/22/2014 Document Reviewed: 04/04/2014 Elsevier Interactive Patient Education 2016 [employer]  

## 2015-11-26 NOTE — Progress Notes (Signed)
Patient: Cynthia Glover Female    DOB: Aug 23, 1991   25 y.o.   MRN: [id number] Visit Date: 11/26/2015  Today's Provider: Mar Daring, PA-C   Chief Complaint  Patient presents with  . Follow-up    Depression and Anxiety   Subjective:    HPI Anxiety: Patient here for her 4 week follow-up on anxiety disorder and sleep disturbance.  She has the following symptoms: irritable. She was las seen on 10/29/15 and symptoms had been getting worst and patient was put on Xanax 0.25 mg and patient wants her dosage increase. She denies current suicidal and homicidal ideation. Family history significant for anxiety and depression. Risk factors: positive family history in  brother(s) and mother.  She complains of the following side effects from the treatment: none.   Depression: Patient following on depression. She complains of depressed mood.She was las seen on 10/29/15 and symptoms had been getting worst and patient was put on Wellbutrin 150 mg. She denies current suicidal and homicidal ideation.Family history significant for anxiety and depression. Risk factors: positive family history in  brother(s) and mother. She complains of the following side effects from the treatment: none. Per patient can't tell the differences with the medication from the last visit.  Obesity: Patient complains of obesity. Patient cites health, self-image as reasons for wanting to lose weight.  Obesity History Weight in late teens: 150 lb. Period of greatest weight gain: 219.4 lbslb during early adult years Lowest adult weight: 150 lbs in 2013. Highest adult weight:now 219.4 lbs   History of Weight Loss Efforts Greatest amount of weight lost:150 lbs in 2013 patient states went to the gym every day. Amount of time that loss was maintained: 4 years Circumstances associated with regain of weight: Increase of appetite. Tired. Successful weight loss techniques attempted: Exercising daily.  Current Exercise Habits  walking during her lunch break, since she had her daughter.  Current Eating Habits Number of regular meals per day: 3 Number of snacking episodes per day: 2 Who shops for food? patient Who prepares food? patient Who eats with patient? Patient and husband. Binge behavior?: yes  Purge behavior? no Anorexic behavior? no Eating precipitated by stress? Possible. Guilt feelings associated with eating? no  Other Potential Contributing Factors Use of alcohol: average 0 drinks/week History of past abuse? none Psych History: anxiety and depression    Allergies  Allergen Reactions  . Penicillins Rash   Previous Medications   ALPRAZOLAM (XANAX) 0.25 MG TABLET    Take 1 tablet (0.25 mg total) by mouth 3 (three) times daily as needed for anxiety.   BUPROPION (WELLBUTRIN SR) 150 MG 12 HR TABLET    Take one tablet PO daily x 1 week; then take 1 tablet PO BID daily thereafter   LEVONORGESTREL (MIRENA, 52 MG,) 20 MCG/24HR IUD    1 each by Intrauterine route once.    Review of Systems  Constitutional: Negative.   HENT: Negative.   Eyes: Negative.   Respiratory: Negative.   Cardiovascular: Negative.   Gastrointestinal: Negative.   Endocrine: Negative.   Genitourinary: Negative.   Musculoskeletal: Negative.   Skin: Negative.   Allergic/Immunologic: Negative.   Neurological: Negative.   Hematological: Negative.   Psychiatric/Behavioral: Positive for dysphoric mood. The patient is nervous/anxious.     Social History  Substance Use Topics  . Smoking status: Former Research scientist (life sciences)  . Smokeless tobacco: Never Used     Comment: Quit in 2015. Smoked for 5 years.  . Alcohol  Use: No   Objective:   There were no vitals taken for this visit.  Physical Exam  Constitutional: She appears well-developed and well-nourished. No distress.  Cardiovascular: Normal rate and regular rhythm.  Exam reveals no gallop and no friction rub.   No murmur heard. Pulmonary/Chest: Effort normal and breath sounds  normal. No respiratory distress. She has no wheezes. She has no rales.  Skin: She is not diaphoretic.  Psychiatric: She has a normal mood and affect. Her behavior is normal. Judgment and thought content normal.  Vitals reviewed.       Assessment & Plan:     1. Acute anxiety Improving. Will refill xanax as below.  Will re-evaluate in 4 weeks. - ALPRAZolam (XANAX) 0.5 MG tablet; Take 0.5 tablets (0.25 mg total) by mouth 3 (three) times daily as needed for anxiety.  Dispense: 60 tablet; Refill: 0  2. Depression At this time she does not wish to use any anti-depression medications. She feels that her depression is most related to her low self-esteem and body image.  She would like to work on that first to see if her depression improves as well.  3. Obesity Discussed in detail weight loss options and goals.  She is to start a food diary and try to stay between 1200-1400 calories daily.  She is also to start slowly adding physical activity 3-4 days per week for 20-40 minutes.  I will see her back in 4 weeks to see how she is doing and recheck her weight. - phentermine 30 MG capsule; Take 1 capsule (30 mg total) by mouth every morning.  Dispense: 30 capsule; Refill: 0  4. BMI 33.0-33.9,adult See above medical treatment plan for obesity. - phentermine 30 MG capsule; Take 1 capsule (30 mg total) by mouth every morning.  Dispense: 30 capsule; Refill: 0       Mar Daring, PA-C  [facility]

## 2015-12-24 ENCOUNTER — Ambulatory Visit (INDEPENDENT_AMBULATORY_CARE_PROVIDER_SITE_OTHER): Payer: 59 | Admitting: Physician Assistant

## 2015-12-24 ENCOUNTER — Encounter: Payer: Self-pay | Admitting: Physician Assistant

## 2015-12-24 VITALS — BP 108/62 | HR 86 | Temp 98.7°F | Resp 16 | Wt 211.0 lb

## 2015-12-24 DIAGNOSIS — F419 Anxiety disorder, unspecified: Secondary | ICD-10-CM

## 2015-12-24 DIAGNOSIS — Z6832 Body mass index (BMI) 32.0-32.9, adult: Secondary | ICD-10-CM

## 2015-12-24 DIAGNOSIS — Z713 Dietary counseling and surveillance: Secondary | ICD-10-CM | POA: Diagnosis not present

## 2015-12-24 DIAGNOSIS — E669 Obesity, unspecified: Secondary | ICD-10-CM

## 2015-12-24 MED ORDER — PHENTERMINE HCL 37.5 MG PO CAPS: 37.5000 mg | ORAL_CAPSULE | ORAL | Status: DC

## 2015-12-24 MED ORDER — ALPRAZOLAM 0.5 MG PO TABS: 0.2500 mg | ORAL_TABLET | Freq: Three times a day (TID) | ORAL | Status: DC | PRN

## 2015-12-24 NOTE — Patient Instructions (Signed)

## 2015-12-24 NOTE — Progress Notes (Signed)
Patient ID: Cynthia Glover, female   DOB: 30-Jul-1991, 25 y.o.   MRN: [id number]       Patient: Cynthia Glover Female    DOB: 09/15/1991   24 y.o.   MRN: [id number] Visit Date: 12/24/2015  Today's Provider: Mar Daring, PA-C   Chief Complaint  Patient presents with  . Weight Check  . Anxiety    follow up    Subjective:    Anxiety Presents for follow-up visit. Patient reports no depressed mood, excessive worry, nervous/anxious behavior, palpitations, panic, shortness of breath or suicidal ideas. The severity of symptoms is mild. The quality of sleep is poor (patient reports that she has been more tired than usual).   The treatment provided moderate relief. Compliance with prior treatments has been good.   Weight Check:  Patient also comes in today to F/U on her weight. Patient has lost 8lbs since last OV. Patient reports that she has been counting calories and watching what she eats. She was doing physical activity intermittently throughout the day while she was at work including increasing walking and using stairs when she could. She does state however that over the last week she has felt more fatigued and has not been exercising like she had been previously. Patient denies any side effects while on medication (phentermine).      Allergies  Allergen Reactions  . Penicillins Rash   Previous Medications   ALPRAZOLAM (XANAX) 0.5 MG TABLET    Take 0.5 tablets (0.25 mg total) by mouth 3 (three) times daily as needed for anxiety.   LEVONORGESTREL (MIRENA, 52 MG,) 20 MCG/24HR IUD    1 each by Intrauterine route once.   PHENTERMINE 30 MG CAPSULE    Take 1 capsule (30 mg total) by mouth every morning.    Review of Systems  Constitutional: Negative.   Respiratory: Negative.  Negative for shortness of breath.   Cardiovascular: Negative.  Negative for palpitations.  Gastrointestinal: Negative.   Genitourinary: Negative.   Psychiatric/Behavioral: Negative.  Negative for suicidal  ideas. The patient is not nervous/anxious.     Social History  Substance Use Topics  . Smoking status: Former Research scientist (life sciences)  . Smokeless tobacco: Never Used     Comment: Quit in 2015. Smoked for 5 years.  . Alcohol Use: No   Objective:   BP 108/62 mmHg  Pulse 86  Temp(Src) 98.7 F (37.1 C)  Resp 16  Wt 211 lb (95.709 kg)  Physical Exam  Constitutional: She appears well-developed and well-nourished. No distress.  Neck: Normal range of motion. Neck supple. No JVD present. No tracheal deviation present. No thyromegaly present.  Cardiovascular: Normal rate, regular rhythm and normal heart sounds.  Exam reveals no gallop and no friction rub.   No murmur heard. Pulmonary/Chest: Effort normal and breath sounds normal. No respiratory distress. She has no wheezes. She has no rales.  Lymphadenopathy:    She has no cervical adenopathy.  Skin: She is not diaphoretic.  Psychiatric: She has a normal mood and affect. Her behavior is normal. Judgment and thought content normal.  Vitals reviewed.       Assessment & Plan:     1. Encounter for weight loss counseling We'll increase phentermine to 37.5 mg capsules instead of 30 mg capsules. I have done this to see if this decreases the fatigue she was having. I did advise her to continue with her food diary as she is doing well with that. Encouraged her to try to find times to add  more physical activity and to a regular routine. This will be her challenge over the next month. I will see her back in 4 weeks to see how she is doing with the increase in medication and to see how she is doing with developing a regular exercise routine. She is to call the office in the meantime if she develops any adverse reactions to the increase in medication, develops any acute issues, questions or concerns. - phentermine 37.5 MG capsule; Take 1 capsule (37.5 mg total) by mouth every morning.  Dispense: 30 capsule; Refill: 0  2. Obesity See above medical treatment plan. -  phentermine 37.5 MG capsule; Take 1 capsule (37.5 mg total) by mouth every morning.  Dispense: 30 capsule; Refill: 0  3. BMI 32.0-32.9,adult See above medical treatment plan.  4. Acute anxiety Currently stable at current dose. She uses approximately 1-2 Xanax per day. She states that 1 month prescription normally lasts approximately 6 weeks. We'll continue current medical treatment plan. She is to call the office if she develops any worsening symptoms. - ALPRAZolam (XANAX) 0.5 MG tablet; Take 0.5 tablets (0.25 mg total) by mouth 3 (three) times daily as needed for anxiety.  Dispense: 60 tablet; Refill: [place], PA-C  [facility]

## 2016-01-21 ENCOUNTER — Ambulatory Visit: Payer: 59 | Admitting: Physician Assistant

## 2016-01-29 ENCOUNTER — Ambulatory Visit: Payer: 59 | Admitting: Physician Assistant

## 2016-03-24 ENCOUNTER — Ambulatory Visit: Payer: 59 | Admitting: Physician Assistant

## 2016-03-31 ENCOUNTER — Encounter: Payer: Self-pay | Admitting: Physician Assistant

## 2016-03-31 ENCOUNTER — Ambulatory Visit (INDEPENDENT_AMBULATORY_CARE_PROVIDER_SITE_OTHER): Payer: 59 | Admitting: Physician Assistant

## 2016-03-31 VITALS — BP 102/70 | HR 89 | Temp 98.1°F | Resp 16 | Wt 212.2 lb

## 2016-03-31 DIAGNOSIS — Z713 Dietary counseling and surveillance: Secondary | ICD-10-CM

## 2016-03-31 DIAGNOSIS — Z6832 Body mass index (BMI) 32.0-32.9, adult: Secondary | ICD-10-CM | POA: Diagnosis not present

## 2016-03-31 DIAGNOSIS — E669 Obesity, unspecified: Secondary | ICD-10-CM | POA: Diagnosis not present

## 2016-03-31 DIAGNOSIS — J069 Acute upper respiratory infection, unspecified: Secondary | ICD-10-CM

## 2016-03-31 MED ORDER — LORCASERIN HCL 10 MG PO TABS: 10.0000 mg | ORAL_TABLET | Freq: Two times a day (BID) | ORAL | Status: DC

## 2016-03-31 NOTE — Patient Instructions (Addendum)
Lorcaserin oral tablets What is this medicine? LORCASERIN (lor ca SER in) is used to promote and maintain weight loss in obese patients. This medicine should be used with a reduced calorie diet and, if appropriate, an exercise program. This medicine may be used for other purposes; ask your health care provider or pharmacist if you have questions. What should I tell my health care provider before I take this medicine? They need to know if you have any of these conditions: -anatomical deformation of the penis, Peyronie's disease, or history of priapism (painful and prolonged erection) -diabetes -heart disease -history of blood diseases, like sickle cell anemia or leukemia -history of irregular heartbeat -kidney disease -liver disease -suicidal thoughts, plans, or attempt; a previous suicide attempt by you or a family member -an unusual or allergic reaction to lorcaserin, other medicines, foods, dyes, or preservatives -pregnant or trying to get pregnant -breast-feeding How should I use this medicine? Take this medicine by mouth with a glass of water. Follow the directions on the prescription label. You can take it with or without food. Take your medicine at regular intervals. Do not take it more often than directed. Do not stop taking except on your doctor's advice. Talk to your pediatrician regarding the use of this medicine in children. Special care may be needed. Overdosage: If you think you have taken too much of this medicine contact a poison control center or emergency room at once. NOTE: This medicine is only for you. Do not share this medicine with others. What if I miss a dose? If you miss a dose, take it as soon as you can. If it is almost time for your next dose, take only that dose. Do not take double or extra doses. What may interact with this medicine? -cabergoline -certain medicines for depression, anxiety, or psychotic disturbances -certain medicines for erectile  dysfunction -certain medicines for migraine headache like almotriptan, eletriptan, frovatriptan, naratriptan, rizatriptan, sumatriptan, zolmitriptan -dextromethorphan -linezolid -lithium -medicines for diabetes -other weight loss products -tramadol -St. John's Wort -stimulant medicines for attention disorders, weight loss, or to stay awake -tryptophan This list may not describe all possible interactions. Give your health care provider a list of all the medicines, herbs, non-prescription drugs, or dietary supplements you use. Also tell them if you smoke, drink alcohol, or use illegal drugs. Some items may interact with your medicine. What should I watch for while using this medicine? This medicine is intended to be used in addition to a healthy diet and appropriate exercise. The best results are achieved this way. Your doctor should instruct you to stop taking this medicine if you do not lose a certain amount of weight within the first 12 weeks of treatment, but it is important that you do not change your dose in any way without consulting your doctor or health care professional. Visit your doctor or health care professional for regular checkups. Your doctor may order blood tests or other tests to see how you are doing. Do not drive, use machinery, or do anything that needs mental alertness until you know how this medicine affects you. This medicine may affect blood sugar levels. If you have diabetes, check with your doctor or health care professional before you change your diet or the dose of your diabetic medicine. Patients and their families should watch out for worsening depression or thoughts of suicide. Also watch out for sudden changes in feelings such as feeling anxious, agitated, panicky, irritable, hostile, aggressive, impulsive, severely restless, overly excited and hyperactive, or  not being able to sleep. If this happens, especially at the beginning of treatment or after a change in dose,  call your health care professional. Contact your doctor or health care professional right away if you are a man with an erection that lasts longer than 4 hours or if the erection becomes painful. This may be a sign of serious problem and must be treated right away to prevent permanent damage. What side effects may I notice from receiving this medicine? Side effects that you should report to your doctor or health care professional as soon as possible: -allergic reactions like skin rash, itching or hives, swelling of the face, lips, or tongue -abnormal production of milk -breast enlargement in both males and females -breathing problems -changes in emotions or moods -changes in vision -confusion -erection lasting more than 4 hours or a painful erection -fast or irregular heart beat -feeling faint or lightheaded, falls -fever or chills, sore throat -hallucination, loss of contact with reality -high or low blood pressure -menstrual changes -restlessness -slow or irregular heartbeat -stiff muscles -sweating -suicidal thoughts or other mood changes -swelling of the ankles, feet, hands -unusually weak or tired -vomiting Side effects that usually do not require medical attention (Report these to your doctor or health care professional if they continue or are bothersome.): -back pain -constipation -cough -dry mouth -nausea -tiredness This list may not describe all possible side effects. Call your doctor for medical advice about side effects. You may report side effects to FDA at 1-800-FDA-1088. Where should I keep my medicine? Keep out of the reach of children. This medicine can be abused. Keep your medicine in a safe place to protect it from theft. Do not share this medicine with anyone. Selling or giving away this medicine is dangerous and against the law. Store at room temperature between 15 and 30 degrees C (59 and 86 degrees F). Throw away any unused medicine after the expiration  date. NOTE: This sheet is a summary. It may not cover all possible information. If you have questions about this medicine, talk to your doctor, pharmacist, or health care provider.    2016, Elsevier/Gold Standard. (2015-06-09 16:21:05) Exercising to Lose Weight Exercising can help you to lose weight. In order to lose weight through exercise, you need to do vigorous-intensity exercise. You can tell that you are exercising with vigorous intensity if you are breathing very hard and fast and cannot hold a conversation while exercising. Moderate-intensity exercise helps to maintain your current weight. You can tell that you are exercising at a moderate level if you have a higher heart rate and faster breathing, but you are still able to hold a conversation. HOW OFTEN SHOULD I EXERCISE? Choose an activity that you enjoy and set realistic goals. Your health care provider can help you to make an activity plan that works for you. Exercise regularly as directed by your health care provider. This may include:  Doing resistance training twice each week, such as:  Push-ups.  Sit-ups.  Lifting weights.  Using resistance bands.  Doing a given intensity of exercise for a given amount of time. Choose from these options:  150 minutes of moderate-intensity exercise every week.  75 minutes of vigorous-intensity exercise every week.  A mix of moderate-intensity and vigorous-intensity exercise every week. Children, pregnant women, people who are out of shape, people who are overweight, and older adults may need to consult a health care provider for individual recommendations. If you have any sort of medical condition, be sure  to consult your health care provider before starting a new exercise program. WHAT ARE SOME ACTIVITIES THAT CAN HELP ME TO LOSE WEIGHT?   Walking at a rate of at least 4.5 miles an hour.  Jogging or running at a rate of 5 miles per hour.  Biking at a rate of at least 10 miles per  hour.  Lap swimming.  Roller-skating or in-line skating.  Cross-country skiing.  Vigorous competitive sports, such as football, basketball, and soccer.  Jumping rope.  Aerobic dancing. HOW CAN I BE MORE ACTIVE IN MY DAY-TO-DAY ACTIVITIES?  Use the stairs instead of the elevator.  Take a walk during your lunch break.  If you drive, park your car farther away from work or school.  If you take public transportation, get off one stop early and walk the rest of the way.  Make all of your phone calls while standing up and walking around.  Get up, stretch, and walk around every 30 minutes throughout the day. WHAT GUIDELINES SHOULD I FOLLOW WHILE EXERCISING?  Do not exercise so much that you hurt yourself, feel dizzy, or get very short of breath.  Consult your health care provider prior to starting a new exercise program.  Wear comfortable clothes and shoes with good support.  Drink plenty of water while you exercise to prevent dehydration or heat stroke. Body water is lost during exercise and must be replaced.  Work out until you breathe faster and your heart beats faster.   This information is not intended to replace advice given to you by your health care provider. Make sure you discuss any questions you have with your health care provider.   Document Released: 12/04/2010 Document Revised: 11/22/2014 Document Reviewed: 04/04/2014 Elsevier Interactive Patient Education [employer].

## 2016-03-31 NOTE — Progress Notes (Signed)
Patient: Cynthia Glover Female    DOB: [birth year]   25 y.o.   MRN: [id number] Visit Date: 03/31/2016  Today's Provider: Mar Daring, PA-C   Chief Complaint  Patient presents with  . Follow-up    Weight Loss management    Subjective:    HPI  Patient is here for her follow up on weight loss counseling. She was previously on Phentermine. She ran out of the Phentermine. She reports that her husband had lost his job and that is why she had not been able to follow up. He has a new job already. She was having a lot of stress so she stopped doing the diary and eating healthy. She is walking on the treadmill for 30 minutes 5 times a week. She reports that she has been eating for comfort. Even without the medication she has maintained her weight on her own only gaining one pound since February.   She also has had a cough for a week and URI symptoms which are improving a little. She has not tried anything for the symptoms.  Wt Readings from Last 3 Encounters:  03/31/16 212 lb 3.2 oz (96.253 kg)  12/24/15 211 lb (95.709 kg)  11/26/15 219 lb 6.4 oz (99.519 kg)      Allergies  Allergen Reactions  . Penicillins Rash   Previous Medications   ALPRAZOLAM (XANAX) 0.5 MG TABLET    Take 0.5 tablets (0.25 mg total) by mouth 3 (three) times daily as needed for anxiety.   LEVONORGESTREL (MIRENA, 52 MG,) 20 MCG/24HR IUD    1 each by Intrauterine route once.   PHENTERMINE 37.5 MG CAPSULE    Take 1 capsule (37.5 mg total) by mouth every morning.    Review of Systems  Constitutional: Negative for fever, chills and fatigue.  HENT: Positive for congestion, postnasal drip and voice change. Negative for ear pain, rhinorrhea, sinus pressure, sneezing and sore throat.   Respiratory: Positive for cough and shortness of breath. Negative for chest tightness and wheezing.   Cardiovascular: Negative for chest pain.  Gastrointestinal: Negative for nausea, vomiting and abdominal pain.  Neurological:  Negative for dizziness and headaches.  Psychiatric/Behavioral: Negative for dysphoric mood and decreased concentration. The patient is not nervous/anxious.     Social History  Substance Use Topics  . Smoking status: Former Research scientist (life sciences)  . Smokeless tobacco: Never Used     Comment: Quit in 2015. Smoked for 5 years.  . Alcohol Use: No   Objective:   BP 102/70 mmHg  Pulse 89  Temp(Src) 98.1 F (36.7 C) (Oral)  Resp 16  Wt 212 lb 3.2 oz (96.253 kg)  Physical Exam  Constitutional: She appears well-developed and well-nourished. No distress.  HENT:  Head: Normocephalic and atraumatic.  Right Ear: Hearing, tympanic membrane, external ear and ear canal normal.  Left Ear: Hearing, external ear and ear canal normal. A middle ear effusion is present.  Nose: Mucosal edema present. No rhinorrhea. Right sinus exhibits no maxillary sinus tenderness and no frontal sinus tenderness. Left sinus exhibits no maxillary sinus tenderness and no frontal sinus tenderness.  Mouth/Throat: Uvula is midline, oropharynx is clear and moist and mucous membranes are normal. No oropharyngeal exudate, posterior oropharyngeal edema or posterior oropharyngeal erythema.  Eyes: Conjunctivae are normal. Pupils are equal, round, and reactive to light. Right eye exhibits no discharge. Left eye exhibits no discharge. No scleral icterus.  Neck: Normal range of motion. Neck supple. No tracheal deviation present. No thyromegaly  present.  Cardiovascular: Normal rate, regular rhythm and normal heart sounds.  Exam reveals no gallop and no friction rub.   No murmur heard. Pulmonary/Chest: Effort normal and breath sounds normal. No stridor. No respiratory distress. She has no wheezes. She has no rales.  Lymphadenopathy:    She has no cervical adenopathy.  Skin: Skin is warm and dry. She is not diaphoretic.  Vitals reviewed.       Assessment & Plan:     1. Encounter for weight loss counseling Phentermine was making her sleepy so  will switch to Belviq as below. Continue exercise as she is already. Start food diary back and adhere to 1200 calorie diet. I will see her back in 5 weeks for weight recheck. - Lorcaserin HCl 10 MG TABS; Take 10 mg by mouth 2 (two) times daily with a meal.  Dispense: 60 tablet; Refill: 0  2. Obesity See above medical treatment plan. - Lorcaserin HCl 10 MG TABS; Take 10 mg by mouth 2 (two) times daily with a meal.  Dispense: 60 tablet; Refill: 0  3. BMI 32.0-32.9,adult See above medical treatment plan.  4. Upper respiratory infection Improving. Add sudafed for 7-10 days for congestion. Call if symptoms worsen.       Mar Daring, PA-C  [facility]

## 2016-04-01 DIAGNOSIS — J069 Acute upper respiratory infection, unspecified: Secondary | ICD-10-CM | POA: Insufficient documentation

## 2016-04-02 ENCOUNTER — Telehealth: Payer: Self-pay | Admitting: Physician Assistant

## 2016-04-02 DIAGNOSIS — E669 Obesity, unspecified: Secondary | ICD-10-CM

## 2016-04-02 DIAGNOSIS — Z6832 Body mass index (BMI) 32.0-32.9, adult: Secondary | ICD-10-CM

## 2016-04-02 MED ORDER — PHENTERMINE HCL 37.5 MG PO CAPS: 37.5000 mg | ORAL_CAPSULE | ORAL | Status: DC

## 2016-04-02 NOTE — Telephone Encounter (Signed)
Please review. JER

## 2016-04-02 NOTE — Telephone Encounter (Signed)
Patient wants to stick to phentermine.   Thanks,  -Zurri Rudden

## 2016-04-02 NOTE — Telephone Encounter (Signed)
New Rx printed. Cancel Belviq. Will place up front for pick up and she still needs to keep her f/u for weight check.

## 2016-04-02 NOTE — Telephone Encounter (Signed)
The only one that is cheap is phentermine. Does she want to try that again. If she has insurance she needs to get a prior auth form sent to me to start and it will be covered and cheaper than out of pocket for the lorcaserin (belviq).

## 2016-04-02 NOTE — Telephone Encounter (Signed)
Pt states the Rx for Lorcaserin HCl 10 MG TABS is very expensive.  Pt is requesting a different Rx that is less expensive.  [place]  CB#[phone]/MW

## 2016-04-02 NOTE — Telephone Encounter (Signed)
Pt advised-aa 

## 2016-05-05 ENCOUNTER — Ambulatory Visit: Payer: 59 | Admitting: Physician Assistant

## 2016-05-19 ENCOUNTER — Other Ambulatory Visit: Payer: Self-pay | Admitting: Physician Assistant

## 2016-05-19 DIAGNOSIS — Z6832 Body mass index (BMI) 32.0-32.9, adult: Secondary | ICD-10-CM

## 2016-05-19 DIAGNOSIS — E669 Obesity, unspecified: Secondary | ICD-10-CM

## 2016-05-19 MED ORDER — PHENTERMINE HCL 37.5 MG PO CAPS: 37.5000 mg | ORAL_CAPSULE | ORAL | Status: DC

## 2016-05-31 ENCOUNTER — Encounter: Payer: Self-pay | Admitting: Physician Assistant

## 2016-05-31 ENCOUNTER — Ambulatory Visit (INDEPENDENT_AMBULATORY_CARE_PROVIDER_SITE_OTHER): Payer: 59 | Admitting: Physician Assistant

## 2016-05-31 VITALS — BP 110/70 | HR 84 | Temp 98.3°F | Resp 16 | Wt 208.2 lb

## 2016-05-31 DIAGNOSIS — F4323 Adjustment disorder with mixed anxiety and depressed mood: Secondary | ICD-10-CM

## 2016-05-31 DIAGNOSIS — F4321 Adjustment disorder with depressed mood: Secondary | ICD-10-CM | POA: Diagnosis not present

## 2016-05-31 MED ORDER — CLONAZEPAM 0.5 MG PO TABS
0.5000 mg | ORAL_TABLET | Freq: Three times a day (TID) | ORAL | Status: DC | PRN
Start: 2016-05-31 — End: 2016-06-04

## 2016-05-31 NOTE — Patient Instructions (Signed)
Clonazepam tablets What is this medicine? CLONAZEPAM (kloe NA ze pam) is a benzodiazepine. It is used to treat certain types of seizures. It is also used to treat panic disorder. This medicine may be used for other purposes; ask your health care provider or pharmacist if you have questions. What should I tell my health care provider before I take this medicine? They need to know if you have any of these conditions: -an alcohol or drug abuse problem -bipolar disorder, depression, psychosis or other mental health condition -glaucoma -kidney or liver disease -lung or breathing disease -myasthenia gravis -Parkinson's disease -porphyria -seizures or a history of seizures -suicidal thoughts -an unusual or allergic reaction to clonazepam, other benzodiazepines, foods, dyes, or preservatives -pregnant or trying to get pregnant -breast-feeding How should I use this medicine? Take this medicine by mouth with a glass of water. Follow the directions on the prescription label. If it upsets your stomach, take it with food or milk. Take your medicine at regular intervals. Do not take it more often than directed. Do not stop taking or change the dose except on the advice of your doctor or health care professional. A special MedGuide will be given to you by the pharmacist with each prescription and refill. Be sure to read this information carefully each time. Talk to your pediatrician regarding the use of this medicine in children. Special care may be needed. Overdosage: If you think you have taken too much of this medicine contact a poison control center or emergency room at once. NOTE: This medicine is only for you. Do not share this medicine with others. What if I miss a dose? If you miss a dose, take it as soon as you can. If it is almost time for your next dose, take only that dose. Do not take double or extra doses. What may interact with this medicine? -herbal or dietary supplements -medicines for  depression, anxiety, or psychotic disturbances -medicines for fungal infections like fluconazole, itraconazole, ketoconazole, voriconazole -medicines for HIV infection or AIDS -medicines for sleep -prescription pain medicines -propantheline -rifampin -sevelamer -some medicines for seizures like carbamazepine, phenobarbital, phenytoin, primidone This list may not describe all possible interactions. Give your health care provider a list of all the medicines, herbs, non-prescription drugs, or dietary supplements you use. Also tell them if you smoke, drink alcohol, or use illegal drugs. Some items may interact with your medicine. What should I watch for while using this medicine? Visit your doctor or health care professional for regular checks on your progress. Your body may become dependent on this medicine. If you have been taking this medicine regularly for some time, do not suddenly stop taking it. You must gradually reduce the dose or you may get severe side effects. Ask your doctor or health care professional for advice before increasing or decreasing the dose. Even after you stop taking this medicine it can still affect your body for several days. If you suffer from several types of seizures, this medicine may increase the chance of grand mal seizures (epilepsy). Let your doctor or health care professional know, he or she may want to prescribe an additional medicine. You may get drowsy or dizzy. Do not drive, use machinery, or do anything that needs mental alertness until you know how this medicine affects you. To reduce the risk of dizzy and fainting spells, do not stand or sit up quickly, especially if you are an older patient. Alcohol may increase dizziness and drowsiness. Avoid alcoholic drinks. Do not treat   yourself for coughs, colds or allergies without asking your doctor or health care professional for advice. Some ingredients can increase possible side effects. The use of this medicine may  increase the chance of suicidal thoughts or actions. Pay special attention to how you are responding while on this medicine. Any worsening of mood, or thoughts of suicide or dying should be reported to your health care professional right away. Women who become pregnant while using this medicine may enroll in the [place] American Antiepileptic Drug Pregnancy Registry by calling 1-888-233-2334. This registry collects information about the safety of antiepileptic drug use during pregnancy. What side effects may I notice from receiving this medicine? Side effects that you should report to your doctor or health care professional as soon as possible: -allergic reactions like skin rash, itching or hives, swelling of the face, lips, or tongue -changes in vision -confusion -depression -hallucinations -mood changes, excitability or aggressive behavior -movement difficulty, staggering or jerky movements -muscle cramps, weakness -tremors -unusual eye movements Side effects that usually do not require medical attention (report to your doctor or health care professional if they continue or are bothersome): -constipation or diarrhea -difficulty sleeping, nightmares -dizziness, drowsiness -headache -increased saliva from your mouth -nausea, vomiting This list may not describe all possible side effects. Call your doctor for medical advice about side effects. You may report side effects to FDA at 1-800-FDA-1088. Where should I keep my medicine? Keep out of the reach of children. This medicine can be abused. Keep your medicine in a safe place to protect it from theft. Do not share this medicine with anyone. Selling or giving away this medicine is dangerous and against the law. This medicine may cause accidental overdose and death if taken by other adults, children, or pets. Mix any unused medicine with a substance like cat litter or coffee grounds. Then throw the medicine away in a sealed container like a sealed  bag or a coffee can with a lid. Do not use the medicine after the expiration date. Store at room temperature between 15 and 30 degrees C (59 and 86 degrees F). Protect from light. Keep container tightly closed. NOTE: This sheet is a summary. It may not cover all possible information. If you have questions about this medicine, talk to your doctor, pharmacist, or health care provider.    2016, Elsevier/Gold Standard. (2015-02-11 14:01:43)  

## 2016-05-31 NOTE — Progress Notes (Signed)
Patient: Cynthia Glover Female    DOB: 18-Nov-1990   24 y.o.   MRN: [id number] Visit Date: 05/31/2016  Today's Provider: Mar Daring, PA-C   Chief Complaint  Patient presents with  . Depression   Subjective:    HPI  Patient is here with c/o of feeling depressed. Her little brother was killed in a car accident June 24. She feels she needs something to help her at this moment. She has been doing well for the most part and grieving appropriately. She is worried about her father, stating "he is a hermit".  She feels her mother is doing ok as she is helping take care of her daughter and that is helping her. Plus she is staying busy. She states work is bothering her the most. When she is doing "mindless work" she is able to focus on what happened and continually focuses on it and trying to analyze what happened. This gets her upset. Initially she was thinking bedtime would be the worst but states she has actually done ok and not had issues sleeping.     Allergies  Allergen Reactions  . Penicillins Rash   Current Meds  Medication Sig  . levonorgestrel (MIRENA, 52 MG,) 20 MCG/24HR IUD 1 each by Intrauterine route once.  . phentermine 37.5 MG capsule Take 1 capsule (37.5 mg total) by mouth every morning.    Review of Systems  Constitutional: Negative.   Respiratory: Negative.   Cardiovascular: Negative.   Gastrointestinal: Negative.   Psychiatric/Behavioral: Positive for dysphoric mood. Negative for suicidal ideas, sleep disturbance, self-injury and decreased concentration. The patient is nervous/anxious (anxious a little at work).        Sad    Social History  Substance Use Topics  . Smoking status: Former Research scientist (life sciences)  . Smokeless tobacco: Never Used     Comment: Quit in 2015. Smoked for 5 years.  . Alcohol Use: No   Objective:   BP 110/70 mmHg  Pulse 84  Temp(Src) 98.3 F (36.8 C) (Oral)  Resp 16  Wt 208 lb 3.2 oz (94.439 kg)  Physical Exam  Constitutional: She  appears well-developed and well-nourished. No distress.  Neck: Normal range of motion. Neck supple.  Cardiovascular: Normal rate, regular rhythm and normal heart sounds.  Exam reveals no gallop and no friction rub.   No murmur heard. Pulmonary/Chest: Effort normal and breath sounds normal. No respiratory distress. She has no wheezes. She has no rales.  Skin: She is not diaphoretic.  Psychiatric: Her speech is normal and behavior is normal. Judgment and thought content normal. Her mood appears not anxious. Cognition and memory are normal. She exhibits a depressed mood.  Vitals reviewed.     Assessment & Plan:     1. Situational mixed anxiety and depressive disorder She has tried xanax before and states it mostly makes her sleepy and did not work well enough. This was used for anxiety prior to this incident. She would like to try klonopin as her aunt uses this and feels it works well for her. Will try klonopin as below and f/u in 2-4 weeks. She will be due for her weight recheck at that time as well. - clonazePAM (KLONOPIN) 0.5 MG tablet; Take 1 tablet (0.5 mg total) by mouth 3 (three) times daily as needed for anxiety.  Dispense: 90 tablet; Refill: 0  2. Grief reaction See above medical treatment plan. - clonazePAM (KLONOPIN) 0.5 MG tablet; Take 1 tablet (0.5 mg total) by  mouth 3 (three) times daily as needed for anxiety.  Dispense: 90 tablet; Refill: 0       Mar Daring, PA-C  [facility]

## 2016-06-04 ENCOUNTER — Telehealth: Payer: Self-pay | Admitting: Physician Assistant

## 2016-06-04 DIAGNOSIS — F418 Other specified anxiety disorders: Secondary | ICD-10-CM

## 2016-06-04 MED ORDER — LORAZEPAM 0.5 MG PO TABS: 0.5000 mg | ORAL_TABLET | Freq: Three times a day (TID) | ORAL | Status: DC | PRN

## 2016-06-04 NOTE — Telephone Encounter (Signed)
I spoke with Cynthia Glover.  She is going to try to give the Clonazepam a few more days to see if the fatigue improves.  She states she does not want to spend more money on a different medication for now.  She will call back if she needs to change.  Thanks,   -Mickel Baas

## 2016-06-04 NOTE — Telephone Encounter (Signed)
Pt started a new medication on Monday and it is making her very sleepy during the day.  This is not helping her anxiety.  [id number]

## 2016-06-04 NOTE — Telephone Encounter (Signed)
OK 

## 2016-06-04 NOTE — Telephone Encounter (Signed)
Have her discontinue clonazepam (may return to pharmacy for disposal) and please call in lorazepam 0.5mg  tab 1 tab PO TID prn anxiety #45 1RF. Thanks.

## 2016-06-17 ENCOUNTER — Ambulatory Visit: Payer: 59 | Admitting: Physician Assistant

## 2016-07-21 ENCOUNTER — Telehealth: Payer: Self-pay | Admitting: Physician Assistant

## 2016-07-21 NOTE — Telephone Encounter (Signed)
Scheduled patient for tomorrow at 4 pm. I gave you 30 minutes.  Thanks,  -Shanedra Lave

## 2016-07-21 NOTE — Telephone Encounter (Signed)
Pt would like to talk about her anxiety medication and having to be out for [facility] regarding anxiety.  Call back is [phone]  Thanks Con Memos

## 2016-07-22 ENCOUNTER — Ambulatory Visit: Payer: Self-pay | Admitting: Physician Assistant

## 2016-07-30 ENCOUNTER — Encounter: Payer: Self-pay | Admitting: Physician Assistant

## 2016-07-30 ENCOUNTER — Ambulatory Visit (INDEPENDENT_AMBULATORY_CARE_PROVIDER_SITE_OTHER): Payer: 59 | Admitting: Physician Assistant

## 2016-07-30 VITALS — BP 112/70 | HR 85 | Temp 98.5°F | Resp 16 | Wt 212.0 lb

## 2016-07-30 DIAGNOSIS — N926 Irregular menstruation, unspecified: Secondary | ICD-10-CM | POA: Diagnosis not present

## 2016-07-30 DIAGNOSIS — F4323 Adjustment disorder with mixed anxiety and depressed mood: Secondary | ICD-10-CM | POA: Diagnosis not present

## 2016-07-30 DIAGNOSIS — R5383 Other fatigue: Secondary | ICD-10-CM

## 2016-07-30 MED ORDER — BUSPIRONE HCL 10 MG PO TABS: 10.0000 mg | ORAL_TABLET | Freq: Two times a day (BID) | ORAL | 1 refills | Status: DC | PRN

## 2016-07-30 MED ORDER — FLUOXETINE HCL 20 MG PO TABS: 20.0000 mg | ORAL_TABLET | Freq: Every day | ORAL | 3 refills | Status: DC

## 2016-07-30 NOTE — Patient Instructions (Signed)
Buspirone tablets What is this medicine? BUSPIRONE (byoo SPYE rone) is used to treat anxiety disorders. This medicine may be used for other purposes; ask your health care provider or pharmacist if you have questions. What should I tell my health care provider before I take this medicine? They need to know if you have any of these conditions: -kidney or liver disease -an unusual or allergic reaction to buspirone, other medicines, foods, dyes, or preservatives -pregnant or trying to get pregnant -breast-feeding How should I use this medicine? Take this medicine by mouth with a glass of water. Follow the directions on the prescription label. You may take this medicine with or without food. To ensure that this medicine always works the same way for you, you should take it either always with or always without food. Take your doses at regular intervals. Do not take your medicine more often than directed. Do not stop taking except on the advice of your doctor or health care professional. Talk to your pediatrician regarding the use of this medicine in children. Special care may be needed. Overdosage: If you think you have taken too much of this medicine contact a poison control center or emergency room at once. NOTE: This medicine is only for you. Do not share this medicine with others. What if I miss a dose? If you miss a dose, take it as soon as you can. If it is almost time for your next dose, take only that dose. Do not take double or extra doses. What may interact with this medicine? Do not take this medicine with any of the following medications: -linezolid -MAOIs like Carbex, Eldepryl, Marplan, Nardil, and Parnate -methylene blue -procarbazine This medicine may also interact with the following medications: -diazepam -digoxin -diltiazem -erythromycin -grapefruit juice -haloperidol -medicines for mental depression or mood problems -medicines for seizures like carbamazepine, phenobarbital  and phenytoin -nefazodone -other medications for anxiety -rifampin -ritonavir -some antifungal medicines like itraconazole, ketoconazole, and voriconazole -verapamil -warfarin This list may not describe all possible interactions. Give your health care provider a list of all the medicines, herbs, non-prescription drugs, or dietary supplements you use. Also tell them if you smoke, drink alcohol, or use illegal drugs. Some items may interact with your medicine. What should I watch for while using this medicine? Visit your doctor or health care professional for regular checks on your progress. It may take 1 to 2 weeks before your anxiety gets better. You may get drowsy or dizzy. Do not drive, use machinery, or do anything that needs mental alertness until you know how this drug affects you. Do not stand or sit up quickly, especially if you are an older patient. This reduces the risk of dizzy or fainting spells. Alcohol can make you more drowsy and dizzy. Avoid alcoholic drinks. What side effects may I notice from receiving this medicine? Side effects that you should report to your doctor or health care professional as soon as possible: -blurred vision or other vision changes -chest pain -confusion -difficulty breathing -feelings of hostility or anger -muscle aches and pains -numbness or tingling in hands or feet -ringing in the ears -skin rash and itching -vomiting -weakness Side effects that usually do not require medical attention (report to your doctor or health care professional if they continue or are bothersome): -disturbed dreams, nightmares -headache -nausea -restlessness or nervousness -sore throat and nasal congestion -stomach upset This list may not describe all possible side effects. Call your doctor for medical advice about side effects. You may report   side effects to FDA at 1-800-FDA-1088. Where should I keep my medicine? Keep out of the reach of children. Store at room  temperature below 30 degrees C (86 degrees F). Protect from light. Keep container tightly closed. Throw away any unused medicine after the expiration date. NOTE: This sheet is a summary. It may not cover all possible information. If you have questions about this medicine, talk to your doctor, pharmacist, or health care provider.    2016, Elsevier/Gold Standard. (2010-06-11 18:06:11)  

## 2016-07-30 NOTE — Progress Notes (Signed)
Patient: Cynthia Glover Female    DOB: 05/12/1991   24 y.o.   MRN: [id number] Visit Date: 07/30/2016  Today's Provider: Mar Daring, PA-C   Chief Complaint  Patient presents with  . Anxiety  . FMLA Form   Subjective:    HPI Anxiety: Patient is here today with c/o of anxiety medication and having to be out for [facility] regarding anxiety. She has had worsening anxiety since the death of her brother at this past summer. She does also report that she and her husband may be trying to get pregnant. She has the following symptoms: difficulty concentrating, fatigue, irritable, palpitations, racing thoughts, sometimes, not all the time. Patient was seen two months ago for situational mixed anxiety and depressive diosrder.she reports that a month ago she started to feel the death of her brother more.She doesn't want to lose her job and that is the reason that she wants her FMLA form to be fill out. She denies current suicidal and homicidal ideation. Family history significant for depression.Risk factors: positive family history in  brother(s) and mother Previous treatment includes Xanax and Clonazepam, but she was having side effect. Patient reported it was not helping her with her anxiety and was making her very sleepy. She reports she is taking some of her xanax.       Allergies  Allergen Reactions  . Penicillins Rash     Current Outpatient Prescriptions:  .  levonorgestrel (MIRENA, 52 MG,) 20 MCG/24HR IUD, 1 each by Intrauterine route once., Disp: , Rfl:  .  LORazepam (ATIVAN) 0.5 MG tablet, Take 1 tablet (0.5 mg total) by mouth every 8 (eight) hours as needed for anxiety., Disp: 45 tablet, Rfl: 1 .  phentermine 37.5 MG capsule, Take 1 capsule (37.5 mg total) by mouth every morning., Disp: 30 capsule, Rfl: 0  Review of Systems  Constitutional: Positive for fatigue. Negative for activity change, appetite change, chills, diaphoresis and fever.  Respiratory: Negative.     Cardiovascular: Negative.   Gastrointestinal: Negative.   Neurological: Negative.   Psychiatric/Behavioral: Positive for agitation, decreased concentration, dysphoric mood and sleep disturbance. Negative for self-injury and suicidal ideas. The patient is nervous/anxious.     Social History  Substance Use Topics  . Smoking status: Former Research scientist (life sciences)  . Smokeless tobacco: Never Used     Comment: Quit in 2015. Smoked for 5 years.  . Alcohol use No   Objective:   BP 112/70 (BP Location: Right Arm, Patient Position: Sitting, Cuff Size: Large)   Pulse 85   Temp 98.5 F (36.9 C) (Oral)   Resp 16   Wt 212 lb (96.2 kg)   BMI 32.23 kg/m   Physical Exam  Constitutional: She appears well-developed and well-nourished. No distress.  Appearance is slightly more disheveled than normal  Neck: Normal range of motion. Neck supple. No JVD present. No tracheal deviation present. No thyromegaly present.  Cardiovascular: Normal rate, regular rhythm and normal heart sounds.  Exam reveals no gallop and no friction rub.   No murmur heard. Pulmonary/Chest: Effort normal and breath sounds normal. No respiratory distress. She has no wheezes. She has no rales.  Lymphadenopathy:    She has no cervical adenopathy.  Skin: She is not diaphoretic.  Psychiatric: Her speech is normal and behavior is normal. Judgment and thought content normal. Her mood appears anxious. Cognition and memory are normal. She exhibits a depressed mood.  Vitals reviewed.      Assessment & Plan:  1. Situational mixed anxiety and depressive disorder I will check labs as below to rule out any other cause for her symptoms. We will change her medications from Xanax to BuSpar as below. We'll also add Prozac. We've chosen these 2 medicines as a are thought to be safer in pregnancy with category B respectively. I will follow-up with her pending the lab results. If labs are all normal I will see her back in 4 weeks to see how she is doing  with these medications. - busPIRone (BUSPAR) 10 MG tablet; Take 1 tablet (10 mg total) by mouth 2 (two) times daily as needed.  Dispense: 60 tablet; Refill: 1 - FLUoxetine (PROZAC) 20 MG tablet; Take 1 tablet (20 mg total) by mouth daily.  Dispense: 30 tablet; Refill: 3 - CBC with Differential - TSH  2. Missed menses Will check labs as below and f/u pending results. See above medical treatment plan. - hCG, serum, qualitative - CBC with Differential - TSH - Comprehensive Metabolic Panel (CMET)  3. Other fatigue See above medical treatment plan. - CBC with Differential - TSH - Comprehensive Metabolic Panel (CMET)       Mar Daring, PA-C  [facility]

## 2016-08-02 ENCOUNTER — Telehealth: Payer: Self-pay | Admitting: Physician Assistant

## 2016-08-02 NOTE — Telephone Encounter (Signed)
Pt states she brought in FMLA paper work on Friday.  Pt is requesting a call back before this paper work is sent in.  [id number]

## 2016-08-03 LAB — CBC WITH DIFFERENTIAL/PLATELET
BASOS ABS: 0 10*3/uL (ref 0.0–0.2)
BASOS: 1 %
EOS (ABSOLUTE): 0.2 10*3/uL (ref 0.0–0.4)
Eos: 4 %
Hematocrit: 38.2 % (ref 34.0–46.6)
Hemoglobin: 12.4 g/dL (ref 11.1–15.9)
IMMATURE GRANS (ABS): 0 10*3/uL (ref 0.0–0.1)
Immature Granulocytes: 0 %
LYMPHS ABS: 2.3 10*3/uL (ref 0.7–3.1)
LYMPHS: 37 %
MCH: 27.9 pg (ref 26.6–33.0)
MCHC: 32.5 g/dL (ref 31.5–35.7)
MCV: 86 fL (ref 79–97)
MONOS ABS: 0.3 10*3/uL (ref 0.1–0.9)
Monocytes: 5 %
NEUTROS ABS: 3.4 10*3/uL (ref 1.4–7.0)
Neutrophils: 53 %
PLATELETS: 289 10*3/uL (ref 150–379)
RBC: 4.45 x10E6/uL (ref 3.77–5.28)
RDW: 14 % (ref 12.3–15.4)
WBC: 6.3 10*3/uL (ref 3.4–10.8)

## 2016-08-03 LAB — COMPREHENSIVE METABOLIC PANEL
A/G RATIO: 1.6 (ref 1.2–2.2)
ALK PHOS: 82 IU/L (ref 39–117)
ALT: 20 IU/L (ref 0–32)
AST: 17 IU/L (ref 0–40)
Albumin: 4.5 g/dL (ref 3.5–5.5)
BILIRUBIN TOTAL: 0.4 mg/dL (ref 0.0–1.2)
BUN/Creatinine Ratio: 10 (ref 9–23)
BUN: 9 mg/dL (ref 6–20)
CHLORIDE: 101 mmol/L (ref 96–106)
CO2: 22 mmol/L (ref 18–29)
Calcium: 9.4 mg/dL (ref 8.7–10.2)
Creatinine, Ser: 0.92 mg/dL (ref 0.57–1.00)
GFR calc Af Amer: 99 mL/min/{1.73_m2} (ref >59)
GFR, EST NON AFRICAN AMERICAN: 86 mL/min/{1.73_m2} (ref >59)
GLOBULIN, TOTAL: 2.8 g/dL (ref 1.5–4.5)
Glucose: 97 mg/dL (ref 65–99)
POTASSIUM: 4 mmol/L (ref 3.5–5.2)
SODIUM: 141 mmol/L (ref 134–144)
Total Protein: 7.3 g/dL (ref 6.0–8.5)

## 2016-08-03 LAB — HCG, SERUM, QUALITATIVE: HCG, BETA SUBUNIT, QUAL, SERUM: NEGATIVE m[IU]/mL (ref <6)

## 2016-08-03 LAB — TSH: TSH: 1.33 u[IU]/mL (ref 0.450–4.500)

## 2016-08-03 NOTE — Telephone Encounter (Signed)
Patient advised of labs results and that the FMLA papers are ready. Patient requested papers to be faxed to the number on the form and for papers to be scan into her chart. Papers were faxed and put up front to be scan.  Thanks,  -[name]

## 2016-08-03 NOTE — Telephone Encounter (Signed)
-----   Message from Mar Daring, PA-C sent at 08/03/2016  9:59 AM EDT ----- All labs are within normal limits and stable.  HCG negative. Thanks! -JB

## 2016-08-23 ENCOUNTER — Telehealth: Payer: Self-pay | Admitting: Physician Assistant

## 2016-08-23 NOTE — Telephone Encounter (Signed)
error 

## 2016-08-25 ENCOUNTER — Ambulatory Visit (INDEPENDENT_AMBULATORY_CARE_PROVIDER_SITE_OTHER): Payer: 59 | Admitting: Physician Assistant

## 2016-08-25 ENCOUNTER — Encounter: Payer: Self-pay | Admitting: Physician Assistant

## 2016-08-25 VITALS — BP 130/88 | HR 85 | Temp 98.1°F | Resp 18 | Wt 211.0 lb

## 2016-08-25 DIAGNOSIS — F4323 Adjustment disorder with mixed anxiety and depressed mood: Secondary | ICD-10-CM | POA: Diagnosis not present

## 2016-08-25 MED ORDER — BUPROPION HCL ER (SR) 150 MG PO TB12: 150.0000 mg | ORAL_TABLET | Freq: Two times a day (BID) | ORAL | 1 refills | Status: DC

## 2016-08-25 NOTE — Progress Notes (Signed)
Patient: Cynthia Glover Female    DOB: [birth year]   25 y.o.   MRN: [id number] Visit Date: 08/25/2016  Today's Provider: Mar Daring, PA-C   Chief Complaint  Patient presents with  . Discuss FMLA  Form   Subjective:    HPI Patient is here today to follow up situational depression and anxiety that has been progressively worsening since the death of her brother in a MVA in July 2017. She has been trying to cope on her own and progressively declining since that time. She has not been compliant with taking anti-depressants previously prescribed. She has only been taking clonazepam as needed, mostly for sleep.   She states she feels like she has not been able to get a breath since then and feels like she is continually sinking. She just wants to run away from it all. She also mentions feeling like she just needs a pick me up to get her back to her normal self.   She has also not done any counseling at this time, but is willing at this time. She states that she does not know what else she can do for herself. Her family has been trying to help her as well without success. She is terrified she is going to lose her job due to simple mistakes and that she will lose her home and her daughter.     Allergies  Allergen Reactions  . Penicillins Rash     Current Outpatient Prescriptions:  .  busPIRone (BUSPAR) 10 MG tablet, Take 1 tablet (10 mg total) by mouth 2 (two) times daily as needed., Disp: 60 tablet, Rfl: 1 .  FLUoxetine (PROZAC) 20 MG tablet, Take 1 tablet (20 mg total) by mouth daily., Disp: 30 tablet, Rfl: 3  Review of Systems  Constitutional: Negative.   Respiratory: Negative.   Cardiovascular: Positive for palpitations. Negative for chest pain and leg swelling.  Gastrointestinal: Positive for abdominal pain and nausea. Negative for vomiting.  Neurological: Positive for headaches. Negative for dizziness and light-headedness.  Psychiatric/Behavioral: Positive for  agitation, decreased concentration, dysphoric mood and sleep disturbance. Negative for self-injury and suicidal ideas (no suicidal ideations just wanting to get away from everything). The patient is nervous/anxious.     Social History  Substance Use Topics  . Smoking status: Former Research scientist (life sciences)  . Smokeless tobacco: Never Used     Comment: Quit in 2015. Smoked for 5 years.  . Alcohol use No   Objective:   BP 130/88 (BP Location: Right Arm, Patient Position: Sitting, Cuff Size: Normal)   Pulse 85   Temp 98.1 F (36.7 C) (Oral)   Resp 18   Wt 211 lb (95.7 kg)   BMI 32.08 kg/m   Physical Exam  Constitutional: She appears well-developed and well-nourished. No distress.  Neck: Normal range of motion. Neck supple.  Cardiovascular: Normal rate, regular rhythm and normal heart sounds.  Exam reveals no gallop and no friction rub.   No murmur heard. Pulmonary/Chest: Effort normal and breath sounds normal. No respiratory distress. She has no wheezes. She has no rales.  Skin: She is not diaphoretic.  Psychiatric: Her speech is normal and behavior is normal. Judgment and thought content normal. Her mood appears anxious. Cognition and memory are normal. She exhibits a depressed mood.  Vitals reviewed.     Assessment & Plan:     1. Situational mixed anxiety and depressive disorder Will continue clonazepam BID as needed and one for sleep. Will add  wellbutrin as below. She is willing to try this medication now. Referral to psychology for grief counseling and coping mechanisms. I have given her a work note to excuse her until 09/09/16. I will see her back on 10/24 or 10/25 to re-evaluate and see if need for continued absence is needed as well as check medication compliance and effectiveness. - clonazePAM (KLONOPIN) 0.5 MG tablet; Take 1 tablet (0.5 mg total) by mouth 3 (three) times daily as needed for anxiety.  Dispense: 90 tablet; Refill: 1 - buPROPion (WELLBUTRIN SR) 150 MG 12 hr tablet; Take 1 tablet  (150 mg total) by mouth 2 (two) times daily.  Dispense: 30 tablet; Refill: 1 - Ambulatory referral to Psychology       Mar Daring, PA-C  [facility]

## 2016-08-25 NOTE — Patient Instructions (Signed)
Bupropion sustained-release tablets (Depression/Mood Disorders)  What is this medicine?  BUPROPION (byoo PROE pee on) is used to treat depression.  This medicine may be used for other purposes; ask your health care provider or pharmacist if you have questions.  What should I tell my health care provider before I take this medicine?  They need to know if you have any of these conditions:  -an eating disorder, such as anorexia or bulimia  -bipolar disorder or psychosis  -diabetes or high blood sugar, treated with medication  -glaucoma  -head injury or brain tumor  -heart disease, previous heart attack, or irregular heart beat  -high blood pressure  -kidney or liver disease  -seizures  -suicidal thoughts or a previous suicide attempt  -Tourette's syndrome  -weight loss  -an unusual or allergic reaction to bupropion, other medicines, foods, dyes, or preservatives  -breast-feeding  -pregnant or trying to become pregnant  How should I use this medicine?  Take this medicine by mouth with a glass of water. Follow the directions on the prescription label. You can take it with or without food. If it upsets your stomach, take it with food. Do not cut, crush or chew this medicine. Take your medicine at regular intervals. If you take this medicine more than once a day, take your second dose at least 8 hours after you take your first dose. To limit difficulty in sleeping, avoid taking this medicine at bedtime. Do not take your medicine more often than directed. Do not stop taking this medicine suddenly except upon the advice of your doctor. Stopping this medicine too quickly may cause serious side effects or your condition may worsen.  A special MedGuide will be given to you by the pharmacist with each prescription and refill. Be sure to read this information carefully each time.  Talk to your pediatrician regarding the use of this medicine in children. Special care may be needed.  Overdosage: If you think you have taken too much  of this medicine contact a poison control center or emergency room at once.  NOTE: This medicine is only for you. Do not share this medicine with others.  What if I miss a dose?  If you miss a dose, skip the missed dose and take your next tablet at the regular time. There should be at least 8 hours between doses. Do not take double or extra doses.  What may interact with this medicine?  Do not take this medicine with any of the following medications:  -linezolid  -MAOIs like Azilect, Carbex, Eldepryl, Marplan, Nardil, and Parnate  -methylene blue (injected into a vein)  -other medicines that contain bupropion like Zyban  This medicine may also interact with the following medications:  -alcohol  -certain medicines for anxiety or sleep  -certain medicines for blood pressure like metoprolol, propranolol  -certain medicines for depression or psychotic disturbances  -certain medicines for HIV or AIDS like efavirenz, lopinavir, nelfinavir, ritonavir  -certain medicines for irregular heart beat like propafenone, flecainide  -certain medicines for Parkinson's disease like amantadine, levodopa  -certain medicines for seizures like carbamazepine, phenytoin, phenobarbital  -cimetidine  -clopidogrel  -cyclophosphamide  -furazolidone  -isoniazid  -nicotine  -orphenadrine  -procarbazine  -steroid medicines like prednisone or cortisone  -stimulant medicines for attention disorders, weight loss, or to stay awake  -tamoxifen  -theophylline  -thiotepa  -ticlopidine  -tramadol  -warfarin  This list may not describe all possible interactions. Give your health care provider a list of all the medicines, herbs,   non-prescription drugs, or dietary supplements you use. Also tell them if you smoke, drink alcohol, or use illegal drugs. Some items may interact with your medicine.  What should I watch for while using this medicine?  Tell your doctor if your symptoms do not get better or if they get worse. Visit your doctor or health care  professional for regular checks on your progress. Because it may take several weeks to see the full effects of this medicine, it is important to continue your treatment as prescribed by your doctor.  Patients and their families should watch out for new or worsening thoughts of suicide or depression. Also watch out for sudden changes in feelings such as feeling anxious, agitated, panicky, irritable, hostile, aggressive, impulsive, severely restless, overly excited and hyperactive, or not being able to sleep. If this happens, especially at the beginning of treatment or after a change in dose, call your health care professional.  Avoid alcoholic drinks while taking this medicine. Drinking excessive alcoholic beverages, using sleeping or anxiety medicines, or quickly stopping the use of these agents while taking this medicine may increase your risk for a seizure.  Do not drive or use heavy machinery until you know how this medicine affects you. This medicine can impair your ability to perform these tasks.  Do not take this medicine close to bedtime. It may prevent you from sleeping.  Your mouth may get dry. Chewing sugarless gum or sucking hard candy, and drinking plenty of water may help. Contact your doctor if the problem does not go away or is severe.  What side effects may I notice from receiving this medicine?  Side effects that you should report to your doctor or health care professional as soon as possible:  -allergic reactions like skin rash, itching or hives, swelling of the face, lips, or tongue  -breathing problems  -changes in vision  -confusion  -fast or irregular heartbeat  -hallucinations  -increased blood pressure  -redness, blistering, peeling or loosening of the skin, including inside the mouth  -seizures  -suicidal thoughts or other mood changes  -unusually weak or tired  -vomiting  Side effects that usually do not require medical attention (report to your doctor or health care professional if they  continue or are bothersome):  -change in sex drive or performance  -constipation  -headache  -loss of appetite  -nausea  -tremors  -weight loss  This list may not describe all possible side effects. Call your doctor for medical advice about side effects. You may report side effects to FDA at 1-800-FDA-1088.  Where should I keep my medicine?  Keep out of the reach of children.  Store at room temperature between 20 and 25 degrees C (68 and 77 degrees F), away from direct sunlight and moisture. Keep tightly closed. Throw away any unused medicine after the expiration date.  NOTE: This sheet is a summary. It may not cover all possible information. If you have questions about this medicine, talk to your doctor, pharmacist, or health care provider.     © 2016, Elsevier/Gold Standard. (2013-05-25 12:41:10)

## 2016-08-27 ENCOUNTER — Telehealth: Payer: Self-pay | Admitting: Physician Assistant

## 2016-08-27 ENCOUNTER — Ambulatory Visit: Payer: 59 | Admitting: Physician Assistant

## 2016-08-27 NOTE — Telephone Encounter (Signed)
Pt would like a call back for an update on the status on her short term disability forms. Please advise. Thanks TNP

## 2016-08-30 NOTE — Telephone Encounter (Signed)
Spoke with patient that forms for the short term disability are ready and that per Tawanna Sat patient need to sign a medical release so we can fax disability paper work and notes. Patent is going to come in and sign release.  Thanks,  -Hadessah Grennan

## 2016-09-02 ENCOUNTER — Encounter: Payer: Self-pay | Admitting: Physician Assistant

## 2016-09-08 ENCOUNTER — Encounter: Payer: Self-pay | Admitting: Physician Assistant

## 2016-09-08 ENCOUNTER — Ambulatory Visit (INDEPENDENT_AMBULATORY_CARE_PROVIDER_SITE_OTHER): Payer: 59 | Admitting: Physician Assistant

## 2016-09-08 VITALS — BP 110/80 | HR 74 | Temp 98.3°F | Resp 16 | Wt 207.2 lb

## 2016-09-08 DIAGNOSIS — F4381 Prolonged grief disorder: Secondary | ICD-10-CM

## 2016-09-08 DIAGNOSIS — F4321 Adjustment disorder with depressed mood: Secondary | ICD-10-CM

## 2016-09-08 DIAGNOSIS — F4323 Adjustment disorder with mixed anxiety and depressed mood: Secondary | ICD-10-CM

## 2016-09-08 DIAGNOSIS — Z634 Disappearance and death of family member: Secondary | ICD-10-CM

## 2016-09-08 DIAGNOSIS — F4329 Adjustment disorder with other symptoms: Secondary | ICD-10-CM | POA: Diagnosis not present

## 2016-09-08 NOTE — Patient Instructions (Signed)
Complicated Grieving Grief is a normal response to the death of someone close to you. Feelings of fear, anger, and guilt can affect almost everyone who loses a loved one. It is also common to have symptoms of depression while you are grieving. These include problems with sleep, loss of appetite, and lack of energy. They may last for weeks or months after a loss. Complicated grief is different from normal grief or depression. Normal grieving involves sadness and feelings of loss, but these feelings are not constant. Complicated grief is a constant and severe type of grief. It interferes with your ability to function normally. It may last for several months to a year or longer. Complicated grief may require treatment from a mental health care provider. CAUSES  It is not known why some people continue to struggle with grief and others do not. You may be at higher risk for complicated grief if:  The death of your loved one was sudden or unexpected.  The death of your loved one was due to a violent event.  Your loved one committed suicide.  Your loved one was a child or a young person.  You were very close to or dependent on the loved one.  You have a history of depression. SIGNS AND SYMPTOMS Signs and symptoms of complicated grief may include:  Feeling disbelief or numbness.  Being unable to enjoy good memories of your loved one.  Needing to avoid anything that reminds you of your loved one.  Being unable to stop thinking about the death.  Feeling intense anger or guilt.  Feeling alone and hopeless.  Feeling that your life is meaningless and empty.  Losing the desire to live. DIAGNOSIS Your health care provider may diagnose complicated grief if:  You have constant symptoms of grief for 6-12 months or longer.  Your symptoms are interfering with your ability to live your life. Your health care provider may want you to see a mental health care provider. Many symptoms of depression  are similar to the symptoms of complicated grief. It is important to be evaluated for complicated grief along with other mental health conditions. TREATMENT  Talk therapy with a mental health provider is the most common treatment for complicated grief. During therapy, you will learn healthy ways to cope with the loss of your loved one. In some cases, your mental health care provider may also recommend antidepressant medicines. HOME CARE INSTRUCTIONS  Take care of yourself.  Eat regular meals and maintain a healthy diet. Eat plenty of fruits, vegetables, and whole grains.  Try to get some exercise each day.  Keep regular hours for sleep. Try to get at least 8 hours of sleep each night.  Do not use drugs or alcohol to ease your symptoms.  Take medicines only as directed by your health care provider.  Spend time with friends and loved ones.  Consider joining a grief (bereavement) support group to help you deal with your loss.  Keep all follow-up visits as directed by your health care provider. This is important. SEEK MEDICAL CARE IF:  Your symptoms keep you from functioning normally.  Your symptoms do not get better with treatment. SEEK IMMEDIATE MEDICAL CARE IF:  You have serious thoughts of hurting yourself or someone else.  You have suicidal feelings.   This information is not intended to replace advice given to you by your health care provider. Make sure you discuss any questions you have with your health care provider.   Document Released: 11/01/2005   Document Revised: 07/23/2015 Document Reviewed: 04/11/2014 Elsevier Interactive Patient Education 2016 Elsevier Inc.  

## 2016-09-08 NOTE — Progress Notes (Signed)
       Patient: Cynthia Glover Female    DOB: January 24, 1991   24 y.o.   MRN: [id number] Visit Date: 09/08/2016  Today's Provider: Mar Daring, PA-C   No chief complaint on file.  Subjective:    HPI Patient is here for follow-up anxiety and depression. She was advised to continue clonazepam BID as needed and one for sleep. She does use one clonazepam for sleep and also uses one during the day on most days (some days none, some days two). Wellbutrin was added at last visit and she feels this is helping as well. She was also referred to psychology for grief counseling and coping mechanisms.Per patient she has the appointment with the counselor tomorrow, first time.     Allergies  Allergen Reactions  . Penicillins Rash     Current Outpatient Prescriptions:  .  buPROPion (WELLBUTRIN SR) 150 MG 12 hr tablet, Take 1 tablet (150 mg total) by mouth 2 (two) times daily., Disp: 30 tablet, Rfl: 1 .  clonazePAM (KLONOPIN) 0.5 MG tablet, Take 1 tablet (0.5 mg total) by mouth 3 (three) times daily as needed for anxiety., Disp: 90 tablet, Rfl: 1  Review of Systems  Constitutional: Negative.   Respiratory: Negative.   Cardiovascular: Negative.   Gastrointestinal: Negative.   Psychiatric/Behavioral: Positive for dysphoric mood and sleep disturbance. Negative for agitation, self-injury and suicidal ideas. The patient is nervous/anxious.     Social History  Substance Use Topics  . Smoking status: Former Research scientist (life sciences)  . Smokeless tobacco: Never Used     Comment: Quit in 2015. Smoked for 5 years.  . Alcohol use No   Objective:   BP 110/80 (BP Location: Right Arm, Patient Position: Sitting, Cuff Size: Normal)   Pulse 74   Temp 98.3 F (36.8 C) (Oral)   Resp 16   Wt 207 lb 3.2 oz (94 kg)   BMI 31.50 kg/m   Physical Exam  Constitutional: She appears well-developed and well-nourished. No distress.  Neck: Normal range of motion. Neck supple.  Cardiovascular: Normal rate, regular rhythm and  normal heart sounds.  Exam reveals no gallop and no friction rub.   No murmur heard. Pulmonary/Chest: Effort normal and breath sounds normal. No respiratory distress. She has no wheezes. She has no rales.  Skin: She is not diaphoretic.  Psychiatric: Her speech is normal and behavior is normal. Judgment and thought content normal. Cognition and memory are normal. She exhibits a depressed mood.  Still mildly flat affect but patient was well groomed today and smiling more  Vitals reviewed.     Assessment & Plan:     1. Situational mixed anxiety and depressive disorder Will have her continue clonazepam and wellbutrin. She sees counseling tomorrow. Will keep her out of work until 10/01/16 for her to continue counseling and get full effectiveness of her medication. I will see her back the week of 09/27/16 to see how she is doing. She is to call if any worsening symptoms.  2. Complicated grief All symptoms have been stemming from the unexpected death of her brother this past summer from a MVA.        Mar Daring, PA-C  [facility]

## 2016-09-09 ENCOUNTER — Encounter: Payer: Self-pay | Admitting: Licensed Clinical Social Worker

## 2016-09-09 ENCOUNTER — Ambulatory Visit (INDEPENDENT_AMBULATORY_CARE_PROVIDER_SITE_OTHER): Payer: 59 | Admitting: Licensed Clinical Social Worker

## 2016-09-09 DIAGNOSIS — F4323 Adjustment disorder with mixed anxiety and depressed mood: Secondary | ICD-10-CM

## 2016-09-09 NOTE — Progress Notes (Signed)
Comprehensive Clinical Assessment (CCA) Note  09/09/2016 ODESSEY BRAXTON [id number]  Visit Diagnosis:      ICD-9-CM ICD-10-CM   1. Adjustment disorder with mixed anxiety and depressed mood 309.28 F43.23       CCA Part One  Part One has been completed on paper by the patient.  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 it doesn't help when she doesn't like her job.  Patients Currently Reported Symptoms/Problems: feeling sad, poor concentrate, lack of patience, snaps easily and didn't use to be like that. Collateral Involvement: no Individual's Strengths: strong willed, hard headed Individual's Preferences: a way for her to deal with things, deal with life outside of therapy, her brother is the first person close to her who has died, she has sheltered life and doesn't know how to deal with things outside her norm Individual's Abilities: very good at interior designing, making things, made home decor,  Type of Services Patient Feels Are Needed: therapy, medication management Initial Clinical Notes/Concerns: first experience with behavioral health, denies past history of SA, or SIB  Mental Health Symptoms Depression:  Depression: Change in energy/activity, Difficulty Concentrating, Fatigue, Hopelessness, Increase/decrease in appetite, Irritability, Sleep (too much or little), Tearfulness, Weight gain/loss, Worthlessness (denies SI, she feels like she wants to get away from things, at one point heading toward  feeling suicidal but did not think of a way to kill herself)  Mania:  Mania: Recklessness, Change in energy/activity, Increased Energy, Irritability, Racing thoughts (doctor was going to put her on medicine to find out if she is bipolar)  Anxiety:   Anxiety: Difficulty concentrating, Fatigue, Irritability, Restlessness, Sleep, Worrying (every day, life and bills, interferes with functioning)  Psychosis:  Psychosis: N/A  Trauma:  Trauma: Avoids reminders of event, Detachment from others, Emotional numbing, Guilt/shame, Irritability/anger  Obsessions:  Obsessions: N/A  Compulsions:  Compulsions: N/A  Inattention:  Inattention: N/A  Hyperactivity/Impulsivity:  Hyperactivity/Impulsivity: N/A  Oppositional/Defiant Behaviors:  Oppositional/Defiant Behaviors: N/A  Borderline Personality:     Other Mood/Personality Symptoms:  Other Mood/Personality Symtpoms: Bipolar-outside of work scared to slow down, she realized she had to calm down because she was staying busy so much   Mental Status Exam Appearance and self-care  Stature:  Stature: Tall  Weight:  Weight: Overweight  Clothing:  Clothing: Casual  Grooming:  Grooming: Normal  Cosmetic use:  Cosmetic Use: Age appropriate  Posture/gait:  Posture/Gait: Normal  Motor activity:  Motor Activity: Not Remarkable  Sensorium  Attention:  Attention: Normal  Concentration:  Concentration: Normal  Orientation:  Orientation: X5  Recall/memory:  Recall/Memory: Normal  Affect and Mood  Affect:  Affect: Blunted  Mood:  Mood: Depressed  Relating  Eye contact:  Eye Contact: Normal  Facial expression:  Facial Expression: Responsive  Attitude toward examiner:  Attitude Toward Examiner: Cooperative  Thought and Language  Speech flow: Speech Flow: Normal  Thought content:  Thought Content: Appropriate to mood and circumstances  Preoccupation:     Hallucinations:     Organization:     Executive  Functions  Fund of Knowledge:  Fund of Knowledge: Average   Intelligence:  Intelligence: Average  Abstraction:  Abstraction: Normal  Judgement:  Judgement: Fair  Art therapist:  Reality Testing: Realistic  Insight:  Insight: Fair  Decision Making:  Decision Making: Confused  Social Functioning  Social Maturity:  Social Maturity: Isolates  Social Judgement:  Social Judgement: Normal  Stress  Stressors:     Coping Ability:     Skill Deficits:     Supports:      Family and Psychosocial History: Family history Marital status: Married Number of Years Married: 3 What types of issues is patient dealing with in the relationship?: none, live with husband and daughter, support-husband and mom Are you sexually active?: Yes What is your sexual orientation?: heterosexual Has your sexual activity been affected by drugs, alcohol, medication, or emotional stress?: no Does patient have children?: Yes How many children?: 1 How is patient's relationship with their children?: daughter who is 2-good relationship, since loss they arguing about "stupid things"  Childhood History:  Childhood History By whom was/is the patient raised?: Both parents Additional childhood history information: good childhood Description of patient's relationship with caregiver when they were a child: mom, dad-good Patient's description of current relationship with people who raised him/her: dad-stand offish, but close to him, really close to him as a child but now really close to mom, dad-home body-on disability, 12 years parents split How were you disciplined when you got in trouble as a child/adolescent?: normal,  Does patient have siblings?: Yes Number of Siblings: 2 Description of patient's current relationship with siblings: middle child, half-sister 31, brother who just passed-close with half-sister, talk to each other about everything Did patient suffer any verbal/emotional/physical/sexual abuse as a child?: No Did patient suffer from severe childhood neglect?: No Has  patient ever been sexually abused/assaulted/raped as an adolescent or adult?: No Was the patient ever a victim of a crime or a disaster?: No Witnessed domestic violence?: No Has patient been effected by domestic violence as an adult?: No  CCA Part Two B  Employment/Work Situation: Employment / Work Copywriter, advertising Employment situation: Employed Where is patient currently employed?: [employer]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, she like [employer] but not her position How long has patient been employed?: 3 Patient's job has been impacted by current illness: Yes Describe how patient's job has been impacted: job functioning has, have to have a lot of productivity each other and it is a lot and you have to focus the whole time to meet productivity What is the longest time patient has a held a job?: 4 years Where was the patient employed at that time?: [employer] Has patient ever been in the [employer]?: No Has patient ever served in combat?: No Did You Receive Any Psychiatric Treatment/Services While in Passenger transport manager?: No Are There Guns or Other Weapons in [place]?: Yes Types of Guns/Weapons: 3-4 rifles Are These Psychologist, educational?: Yes  Education: Education School Currently Attending: no Last Grade Completed: 14 Name of [place]: [employer] Did Teacher, adult education From [employer]?: Yes Did Physicist, medical?: Yes What Type of College Degree Do you Have?: Associates in [employer] What Was Your Major?: Arts Did You Have Any Special Interests In School?: mathematics and computers Did You Have An Individualized Education Program (IIEP): No Did You Have Any Difficulty At [employer]?: No  Religion: Religion/Spirituality Are You A Religious Person?:  (spiritual) How Might This Affect Treatment?: no  Leisure/Recreation:  Leisure / Recreation Leisure and Hobbies: DYI-make things, Community education officer, cooking  Exercise/Diet: Exercise/Diet Do You  Exercise?: No Have You Gained or Lost A Significant Amount of Weight in the Past Six Months?: Yes-Lost Number of Pounds Lost?: 5 Do You Follow a Special Diet?: No Do You Have Any Trouble Sleeping?: Yes Explanation of Sleeping Difficulties: sleeps too much  CCA Part Two C  Alcohol/Drug Use: Alcohol / Drug Use Pain Medications: n/a Prescriptions: see med list Over the Counter: n/a History of alcohol / drug use?: No history of alcohol / drug abuse                      CCA Part Three  ASAM's:  Six Dimensions of Multidimensional Assessment  Dimension 1:  Acute Intoxication and/or Withdrawal Potential:     Dimension 2:  Biomedical Conditions and Complications:     Dimension 3:  Emotional, Behavioral, or Cognitive Conditions and Complications:     Dimension 4:  Readiness to Change:     Dimension 5:  Relapse, Continued use, or Continued Problem Potential:     Dimension 6:  Recovery/Living Environment:      Substance use Disorder (SUD)    Social Function:  Social Functioning Social Maturity: Isolates Social Judgement: Normal  Stress:  Stress Priority Risk: Low Acuity  Risk Assessment- Self-Harm Potential: Risk Assessment For Self-Harm Potential Thoughts of Self-Harm: No current thoughts Method: No plan Availability of Means: Have close by  Risk Assessment -Dangerous to Others Potential: Risk Assessment For Dangerous to Others Potential Method: No Plan Availability of Means: Has close by Intent: Vague intent or NA Notification Required: No need or identified person  DSM5 Diagnoses: Patient Active Problem List   Diagnosis Date Noted  . 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 Obesity 03/31/2016  . BMI 32.0-32.9,adult 03/31/2016  . Not immune to rubella 06/06/2014    Patient Centered Plan: Patient is on the following Treatment Plan(s):  Anxiety and Depression, Grief  Recommendations for Services/Supports/Treatments:    Treatment  Plan Summary: Patient is a 25 year old married female who was referred by Raynelle Jan, PA for grief related to loss of her younger brother 4 months ago. She presents with symptoms of depression and anxiety and is being followed for medication management with her provider. She denies current SI, past SA, HI or substance abuse. She relates that symptoms are compounded because she does not like what she is currently doing at her job and it is very stressful. This is her first treatment episode. She is recommended for individual therapy to help with grief issues, strategies to help manage anxiety and stress and supportive interventions. She is recommended to continue medication management.     Referrals to Alternative Service(s): Referred to Alternative Service(s):   Place:   Date:   Time:    Referred to Alternative Service(s):   Place:   Date:   Time:    Referred to Alternative Service(s):   Place:   Date:   Time:    Referred to Alternative Service(s):   Place:   Date:   Time:     Jiles Goya A

## 2016-09-16 ENCOUNTER — Ambulatory Visit (INDEPENDENT_AMBULATORY_CARE_PROVIDER_SITE_OTHER): Payer: 59 | Admitting: Licensed Clinical Social Worker

## 2016-09-16 DIAGNOSIS — F321 Major depressive disorder, single episode, moderate: Secondary | ICD-10-CM

## 2016-09-16 NOTE — Progress Notes (Signed)
THERAPIST PROGRESS NOTE  Session Time: 1 PM to 1:50 PM  Participation Level: Active  Behavioral Response: CasualAlertblunted  Type of Therapy: Individual Therapy  Treatment Goals addressed: decrease depression, anxiety, work through grief issues  Interventions: DBT, Solution Focused, Strength-based, Supportive and Other: relaxation, grief processing  Summary: Cynthia Glover is a 25 y.o. female who presents with her realization that her current job has a big part to do with her mood. She is looking for other positions because she feels that if she returns she could get worse. She is starting to feel she is getting things back under control and going back to her job she risks going back to old ways Patient is coping by staying busy but also realizes that she avoids putting herself in situations that make her sad. She also described more conflict with her husband. She is more quick to react to things that causes conflicts to escalate. She identifies problems with rumination and relates that her husband has told her that she makes out problems in her head and makes something big over something little. She identifies herself as overanalyzing things. Her activities such as DIY activities helps her get some distance from from emotions. She recognizes that she is hypersensitive but also emotionally numb and emotionally disconnected. She realizes she has been going to stages of grief but for her she started with pain in guilt than anger and now shock and denial. Shared with therapist that she can get really angry about things when they're out of her control and starts to act out on her anger and then she disconnects herself when she feeds into her angry thoughts. She shares that it's strange because she is open to change but the same time and things can be irritating for her. Discussed relaxation strategies and patient is going to look into yoga and aromatherapy to help her and de-escalation of anxiety and  help with depression.   Suicidal/Homicidal: No  Therapist Response: Reviewed patient's progress and symptoms. Reviewed the stages of grief for patient to have a better understanding of her emotions and process of grief to help with coping. Introduced distress tolerance and discussed that people can deal with overwhelming emotions in healthy her unhealthy ways. Identified patient is numb and disconnected from her emotions currently. Explained in patient's process of grief in order to process emotions in healthy ways she is going to have to let herself feel the emotions. Explained that disconnection at some point is not helpful as patient can only apply healthy coping strategies if she is present. Related that avoidance of emotions makes problems worse. Discussed rituals that canl help with the grief process. Explained that distress tolerance skills include distraction skills as they temporarily stop you from thinking about your pain but at the same time you have to come back to the issue and they help give you time to find an appropriate coping response. Discussed yoga and aromatherapy as helpful relaxation strategies. Use problem solving around concerns about work. Encouraged patient to go to a support group. Encouraged patient to recognize that at this particular time she is going to be more sensitive and practice self-care to help her in coping.  Discussed connection between mind and body and how taking care of herself physically will help her mentally. Provided supportive and strength-based interventions.  Plan: Return again in 3 weeks.2. Patient states look into and start practicing yoga and aromatherapy.3. Patient learn and apply healthy strategies to manage her emotions.4. Depressed continue to help  patient work through grief process  Diagnosis: Axis I: Depressive Disorder    Axis II: No diagnosis    Addysen Louth A, LCSW 09/16/2016

## 2016-09-29 ENCOUNTER — Ambulatory Visit (INDEPENDENT_AMBULATORY_CARE_PROVIDER_SITE_OTHER): Payer: 59 | Admitting: Physician Assistant

## 2016-09-29 ENCOUNTER — Encounter: Payer: Self-pay | Admitting: Physician Assistant

## 2016-09-29 VITALS — BP 120/70 | HR 84 | Temp 98.1°F | Resp 16 | Wt 211.2 lb

## 2016-09-29 DIAGNOSIS — F4323 Adjustment disorder with mixed anxiety and depressed mood: Secondary | ICD-10-CM

## 2016-09-29 MED ORDER — BUPROPION HCL ER (SR) 150 MG PO TB12: 150.0000 mg | ORAL_TABLET | Freq: Every day | ORAL | 3 refills | Status: DC

## 2016-09-29 MED ORDER — CLONAZEPAM 0.5 MG PO TABS: 0.5000 mg | ORAL_TABLET | Freq: Three times a day (TID) | ORAL | 3 refills | Status: DC | PRN

## 2016-09-29 MED ORDER — SERTRALINE HCL 25 MG PO TABS: 25.0000 mg | ORAL_TABLET | Freq: Every day | ORAL | 1 refills | Status: DC

## 2016-09-29 NOTE — Patient Instructions (Signed)
Sertraline tablets What is this medicine? SERTRALINE (SER tra leen) is used to treat depression. It may also be used to treat obsessive compulsive disorder, panic disorder, post-trauma stress, premenstrual dysphoric disorder (PMDD) or social anxiety. This medicine may be used for other purposes; ask your health care provider or pharmacist if you have questions. COMMON BRAND NAME(S): Zoloft What should I tell my health care provider before I take this medicine? They need to know if you have any of these conditions: -bleeding disorders -bipolar disorder or a family history of bipolar disorder -glaucoma -heart disease -high blood pressure -history of irregular heartbeat -history of low levels of calcium, magnesium, or potassium in the blood -if you often drink alcohol -liver disease -receiving electroconvulsive therapy -seizures -suicidal thoughts, plans, or attempt; a previous suicide attempt by you or a family member -take medicines that treat or prevent blood clots -thyroid disease -an unusual or allergic reaction to sertraline, other medicines, foods, dyes, or preservatives -pregnant or trying to get pregnant -breast-feeding How should I use this medicine? Take this medicine by mouth with a glass of water. Follow the directions on the prescription label. You can take it with or without food. Take your medicine at regular intervals. Do not take your medicine more often than directed. Do not stop taking this medicine suddenly except upon the advice of your doctor. Stopping this medicine too quickly may cause serious side effects or your condition may worsen. A special MedGuide will be given to you by the pharmacist with each prescription and refill. Be sure to read this information carefully each time. Talk to your pediatrician regarding the use of this medicine in children. While this drug may be prescribed for children as young as 7 years for selected conditions, precautions do  apply. Overdosage: If you think you have taken too much of this medicine contact a poison control center or emergency room at once. NOTE: This medicine is only for you. Do not share this medicine with others. What if I miss a dose? If you miss a dose, take it as soon as you can. If it is almost time for your next dose, take only that dose. Do not take double or extra doses. What may interact with this medicine? Do not take this medicine with any of the following medications: -certain medicines for fungal infections like fluconazole, itraconazole, ketoconazole, posaconazole, voriconazole -cisapride -disulfiram -dofetilide -linezolid -MAOIs like Carbex, Eldepryl, Marplan, Nardil, and Parnate -metronidazole -methylene blue (injected into a vein) -pimozide -thioridazine -ziprasidone This medicine may also interact with the following medications: -alcohol -amphetamines -aspirin and aspirin-like medicines -certain medicines for depression, anxiety, or psychotic disturbances -certain medicines for irregular heart beat like flecainide, propafenone -certain medicines for migraine headaches like almotriptan, eletriptan, frovatriptan, naratriptan, rizatriptan, sumatriptan, zolmitriptan -certain medicines for sleep -certain medicines for seizures like carbamazepine, valproic acid, phenytoin -certain medicines that treat or prevent blood clots like warfarin, enoxaparin, dalteparin -cimetidine -digoxin -diuretics -fentanyl -furazolidone -isoniazid -lithium -NSAIDs, medicines for pain and inflammation, like ibuprofen or naproxen -other medicines that prolong the QT interval (cause an abnormal heart rhythm) -procarbazine -rasagiline -supplements like St. John's wort, kava kava, valerian -tolbutamide -tramadol -tryptophan This list may not describe all possible interactions. Give your health care provider a list of all the medicines, herbs, non-prescription drugs, or dietary supplements you  use. Also tell them if you smoke, drink alcohol, or use illegal drugs. Some items may interact with your medicine. What should I watch for while using this medicine? Tell your doctor if  your symptoms do not get better or if they get worse. Visit your doctor or health care professional for regular checks on your progress. Because it may take several weeks to see the full effects of this medicine, it is important to continue your treatment as prescribed by your doctor. Patients and their families should watch out for new or worsening thoughts of suicide or depression. Also watch out for sudden changes in feelings such as feeling anxious, agitated, panicky, irritable, hostile, aggressive, impulsive, severely restless, overly excited and hyperactive, or not being able to sleep. If this happens, especially at the beginning of treatment or after a change in dose, call your health care professional. Dennis Bast may get drowsy or dizzy. Do not drive, use machinery, or do anything that needs mental alertness until you know how this medicine affects you. Do not stand or sit up quickly, especially if you are an older patient. This reduces the risk of dizzy or fainting spells. Alcohol may interfere with the effect of this medicine. Avoid alcoholic drinks. Your mouth may get dry. Chewing sugarless gum or sucking hard candy, and drinking plenty of water may help. Contact your doctor if the problem does not go away or is severe. What side effects may I notice from receiving this medicine? Side effects that you should report to your doctor or health care professional as soon as possible: -allergic reactions like skin rash, itching or hives, swelling of the face, lips, or tongue -anxious -black, tarry stools -changes in vision -confusion -elevated mood, decreased need for sleep, racing thoughts, impulsive behavior -eye pain -fast, irregular heartbeat -feeling faint or lightheaded, falls -feeling agitated, angry, or  irritable -hallucination, loss of contact with reality -loss of balance or coordination -loss of memory -painful or prolonged erections -restlessness, pacing, inability to keep still -seizures -stiff muscles -suicidal thoughts or other mood changes -trouble sleeping -unusual bleeding or bruising -unusually weak or tired -vomiting Side effects that usually do not require medical attention (report to your doctor or health care professional if they continue or are bothersome): -change in appetite or weight -change in sex drive or performance -diarrhea -increased sweating -indigestion, nausea -tremors This list may not describe all possible side effects. Call your doctor for medical advice about side effects. You may report side effects to FDA at 1-800-FDA-1088. Where should I keep my medicine? Keep out of the reach of children. Store at room temperature between 15 and 30 degrees C (59 and 86 degrees F). Throw away any unused medicine after the expiration date. NOTE: This sheet is a summary. It may not cover all possible information. If you have questions about this medicine, talk to your doctor, pharmacist, or health care provider.  2017 Elsevier/Gold Standard (2016-04-03 15:54:02)

## 2016-09-29 NOTE — Progress Notes (Signed)
Patient: Cynthia Glover Female    DOB: 04-19-1991   25 y.o.   MRN: [id number] Visit Date: 09/29/2016  Today's Provider: Mar Daring, PA-C   Chief Complaint  Patient presents with  . Follow-up    Anxiety and depressive disorder   Subjective:    HPI Patient is here to follow-up on Anxiety and Depressive Disorder she was doing much better last office visit. She is seeing her counselor. She was advised to continue Clonazepam BID and Wellbutrin.She reports that she is feeling ok.  She reports that she needs a refill on her Clonazepam.She does not see much difference with the Wellbutrin currently. She does report that she is doing well with her counseling but she is unable to see her counselor until after Thanksgiving holiday due to her being out of the office. She is also concerned because of the holidays upcoming and missing her brother. She does report having one dream of her brother and this is the first time this has happened. She states that since the dream she had she has felt off.    Allergies  Allergen Reactions  . Penicillins Rash     Current Outpatient Prescriptions:  .  buPROPion (WELLBUTRIN SR) 150 MG 12 hr tablet, Take 1 tablet (150 mg total) by mouth 2 (two) times daily., Disp: 30 tablet, Rfl: 1 .  clonazePAM (KLONOPIN) 0.5 MG tablet, Take 1 tablet (0.5 mg total) by mouth 3 (three) times daily as needed for anxiety., Disp: 90 tablet, Rfl: 1  Review of Systems  Constitutional: Negative.   Respiratory: Negative.   Cardiovascular: Negative.   Gastrointestinal: Negative.   Psychiatric/Behavioral: Positive for dysphoric mood and sleep disturbance. The patient is nervous/anxious.     Social History  Substance Use Topics  . Smoking status: Former Research scientist (life sciences)  . Smokeless tobacco: Never Used     Comment: Quit in 2015. Smoked for 5 years.  . Alcohol use No   Objective:   BP 120/70 (BP Location: Right Arm, Patient Position: Sitting, Cuff Size: Normal)   Pulse 84    Temp 98.1 F (36.7 C) (Oral)   Resp 16   Wt 211 lb 3.2 oz (95.8 kg)   BMI 32.11 kg/m   Physical Exam  Constitutional: She appears well-developed and well-nourished. No distress.  Neck: Normal range of motion. Neck supple. No JVD present. No tracheal deviation present. No thyromegaly present.  Cardiovascular: Normal rate, regular rhythm and normal heart sounds.  Exam reveals no gallop and no friction rub.   No murmur heard. Pulmonary/Chest: Effort normal and breath sounds normal. No respiratory distress. She has no wheezes. She has no rales.  Lymphadenopathy:    She has no cervical adenopathy.  Skin: She is not diaphoretic.  Psychiatric: Her speech is normal and behavior is normal. Judgment and thought content normal. Her mood appears not anxious. Cognition and memory are normal. She exhibits a depressed mood.  Vitals reviewed.      Assessment & Plan:     1. Situational mixed anxiety and depressive disorder Clonazepam refilled as below. We'll decrease Wellbutrin to once daily in the morning and add sertraline 25 mg at night. I have released her to go back to work. She is to call the office if she has any acute issues in the meantime. If not I will see her back in 4 weeks to check her progress and see how she is doing with work. - clonazePAM (KLONOPIN) 0.5 MG tablet; Take 1 tablet (  0.5 mg total) by mouth 3 (three) times daily as needed for anxiety.  Dispense: 90 tablet; Refill: 3 - buPROPion (WELLBUTRIN SR) 150 MG 12 hr tablet; Take 1 tablet (150 mg total) by mouth daily.  Dispense: 30 tablet; Refill: 3 - sertraline (ZOLOFT) 25 MG tablet; Take 1 tablet (25 mg total) by mouth at bedtime.  Dispense: 30 tablet; Refill: Glidden, PA-C  [facility]

## 2016-10-13 ENCOUNTER — Ambulatory Visit: Payer: 59 | Admitting: Licensed Clinical Social Worker

## 2016-10-27 ENCOUNTER — Ambulatory Visit: Payer: Self-pay | Admitting: Physician Assistant

## 2017-05-10 ENCOUNTER — Emergency Department
Admission: EM | Admit: 2017-05-10 | Discharge: 2017-05-10 | Disposition: A | Discharge status: Home / Self Care | Payer: 59 | Attending: Emergency Medicine | Admitting: Emergency Medicine

## 2017-05-10 ENCOUNTER — Encounter: Payer: Self-pay | Admitting: Emergency Medicine

## 2017-05-10 DIAGNOSIS — Z87891 Personal history of nicotine dependence: Secondary | ICD-10-CM | POA: Insufficient documentation

## 2017-05-10 DIAGNOSIS — Z79899 Other long term (current) drug therapy: Secondary | ICD-10-CM | POA: Insufficient documentation

## 2017-05-10 DIAGNOSIS — F419 Anxiety disorder, unspecified: Secondary | ICD-10-CM | POA: Insufficient documentation

## 2017-05-10 DIAGNOSIS — R112 Nausea with vomiting, unspecified: Secondary | ICD-10-CM

## 2017-05-10 LAB — POCT PREGNANCY, URINE: PREG TEST UR: NEGATIVE

## 2017-05-10 MED ORDER — ONDANSETRON 4 MG PO TBDP: 4.0000 mg | ORAL_TABLET | Freq: Three times a day (TID) | ORAL | 0 refills | Status: DC | PRN

## 2017-05-10 MED ORDER — ONDANSETRON 4 MG PO TBDP
4.0000 mg | ORAL_TABLET | Freq: Once | ORAL | Status: AC
  Administered 2017-05-10: 4 mg via ORAL
  Filled 2017-05-10: qty 1

## 2017-05-10 NOTE — Discharge Instructions (Signed)
Clear liquids for the next 24 hours. Zofran as needed for nausea or vomiting. Follow-up with your primary care provider for further test if symptoms continue.

## 2017-05-10 NOTE — ED Notes (Signed)
Spoke with Dr. Quentin Cornwall and Dr. Mable Paris in regards to patients presentation. See orders.

## 2017-05-10 NOTE — ED Notes (Signed)
See triage note  States she has been having intermittent abd pain for about 1 week  Pos nausea  But has had some vomiting today   Denies any fever but did have some diarrhea

## 2017-05-10 NOTE — ED Provider Notes (Signed)
[facility] Emergency Department Provider Note   ____________________________________________   None    (approximate)  I have reviewed the triage vital signs and the nursing notes.   HISTORY  Chief Complaint Pelvic Pain    HPI Cynthia Glover is a 26 y.o. female patient is here with complaint of pelvic pain and nausea. Patient states this has some cramping for one week and nausea with vomiting approximately 3-4 times per day. Patient is unaware if she is pregnant as she stopped her birth control in November and has not had a regular period. She is concerned that she may be pregnant. She denies any fever or chills. She continues to eat and drink as normal. She denies any diarrhea.   Past Medical History:  Diagnosis Date  . Anxiety   . Depression     Patient Active Problem List   Diagnosis Date Noted  . 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 Obesity 03/31/2016  . BMI 32.0-32.9,adult 03/31/2016  . Not immune to rubella 06/06/2014    History reviewed. No pertinent surgical history.  Prior to Admission medications   Medication Sig Start Date End Date Taking? Authorizing Provider  buPROPion (WELLBUTRIN SR) 150 MG 12 hr tablet Take 1 tablet (150 mg total) by mouth daily. 09/29/16   Mar Daring, PA-C  clonazePAM (KLONOPIN) 0.5 MG tablet Take 1 tablet (0.5 mg total) by mouth 3 (three) times daily as needed for anxiety. 09/29/16   Mar Daring, PA-C  ondansetron (ZOFRAN ODT) 4 MG disintegrating tablet Take 1 tablet (4 mg total) by mouth every 8 (eight) hours as needed for nausea or vomiting. 05/10/17   Johnn Hai, PA-C  sertraline (ZOLOFT) 25 MG tablet Take 1 tablet (25 mg total) by mouth at bedtime. 09/29/16   Mar Daring, PA-C    Allergies Penicillins  Family History  Problem Relation Age of Onset  . Depression Father   . Diabetes Father   . Depression Brother   . Anxiety disorder Brother       Social History Social History  Substance Use Topics  . Smoking status: Former Research scientist (life sciences)  . Smokeless tobacco: Never Used     Comment: Quit in 2015. Smoked for 5 years.  . Alcohol use No    Review of Systems Constitutional: No fever/chills Cardiovascular: Denies chest pain. Respiratory: Denies shortness of breath. Gastrointestinal: Positive cramping.  Positive nausea, Positive vomiting. Genitourinary: Negative for dysuria. Musculoskeletal: Negative for back pain. Skin: Negative for rash. Neurological: Negative for headaches   ____________________________________________   PHYSICAL EXAM:  VITAL SIGNS: ED Triage Vitals [05/10/17 1222]  Enc Vitals Group     BP (!) 120/104     Pulse Rate 66     Resp 16     Temp 98.5 F (36.9 C)     Temp src      SpO2 97 %     Weight 205 lb (93 kg)     Height 5\' 8"  (1.727 m)     Head Circumference      Peak Flow      Pain Score 7     Pain Loc      Pain Edu?      Excl. in [place]?     Constitutional: Alert and oriented. Well appearing and in no acute distress. Eyes: Conjunctivae are normal. PERRL. EOMI. Head: Atraumatic. Nose: No congestion/rhinnorhea. Mouth/Throat: Mucous membranes are moist.  Oropharynx non-erythematous. Neck: No stridor.   Cardiovascular: Normal rate, regular  rhythm. Grossly normal heart sounds.  Good peripheral circulation. Respiratory: Normal respiratory effort.  No retractions. Lungs CTAB. Gastrointestinal: Soft and nontender. No distention. Bowel sounds normal 4 quadrants. No point tenderness and no rebound tenderness is noted. Musculoskeletal: No lower extremity tenderness nor edema.  Neurologic:  Normal speech and language. No gross focal neurologic deficits are appreciated. No gait instability. Skin:  Skin is warm, dry and intact. No rash noted. Psychiatric: Mood and affect are normal. Speech and behavior are normal.  ____________________________________________   LABS (all labs ordered are listed, but  only abnormal results are displayed)  Labs Reviewed  POC URINE PREG, ED  POCT PREGNANCY, URINE     PROCEDURES  Procedure(s) performed: None  Procedures  Critical Care performed: No  ____________________________________________   INITIAL IMPRESSION / ASSESSMENT AND PLAN / ED COURSE  Pertinent labs & imaging results that were available during my care of the patient were reviewed by me and considered in my medical decision making (see chart for details).  Patient was given Zofran while in the department and began feeling better. She will make an appointment with her PCP for further testing since she was made aware that she is not pregnant.      ____________________________________________   FINAL CLINICAL IMPRESSION(S) / ED DIAGNOSES  Final diagnoses:  Non-intractable vomiting with nausea, unspecified vomiting type      NEW MEDICATIONS STARTED DURING THIS VISIT:  Discharge Medication List as of 05/10/2017  2:43 PM    START taking these medications   Details  ondansetron (ZOFRAN ODT) 4 MG disintegrating tablet Take 1 tablet (4 mg total) by mouth every 8 (eight) hours as needed for nausea or vomiting., Starting Tue 05/10/2017, Print         Note:  This document was prepared using Dragon voice recognition software and may include unintentional dictation errors.    Johnn Hai, PA-C 05/10/17 1548    Earleen Newport, MD 05/12/17 [phone]

## 2017-05-10 NOTE — ED Triage Notes (Signed)
Patient presents to ED via POV from home with c/o pelvic pain and nausea. Patient states, "I might be pregnant I have no idea". Even and non labored respirations noted.

## 2017-05-10 NOTE — ED Notes (Signed)
Urine sample sent down to lab. No orders at this time.

## 2017-10-12 LAB — OB RESULTS CONSOLE VARICELLA ZOSTER ANTIBODY, IGG: VARICELLA IGG: IMMUNE

## 2017-10-12 LAB — OB RESULTS CONSOLE RPR: RPR: NONREACTIVE

## 2017-10-12 LAB — OB RESULTS CONSOLE RUBELLA ANTIBODY, IGM: RUBELLA: IMMUNE

## 2017-10-13 ENCOUNTER — Other Ambulatory Visit: Payer: Self-pay | Admitting: Certified Nurse Midwife

## 2017-10-13 DIAGNOSIS — Z369 Encounter for antenatal screening, unspecified: Secondary | ICD-10-CM

## 2017-10-24 ENCOUNTER — Ambulatory Visit: Payer: 59

## 2017-10-27 ENCOUNTER — Ambulatory Visit
Admission: RE | Admit: 2017-10-27 | Discharge: 2017-10-27 | Disposition: A | Discharge status: Home / Self Care | Payer: Medicaid Other | Source: Ambulatory Visit | Attending: Certified Nurse Midwife | Admitting: Certified Nurse Midwife

## 2017-10-27 ENCOUNTER — Ambulatory Visit
Admission: RE | Admit: 2017-10-27 | Discharge: 2017-10-27 | Disposition: A | Discharge status: Home / Self Care | Payer: Medicaid Other | Source: Ambulatory Visit | Attending: Obstetrics and Gynecology | Admitting: Obstetrics and Gynecology

## 2017-10-27 VITALS — BP 130/68 | HR 75 | Temp 98.2°F | Resp 18 | Ht 68.4 in | Wt 195.4 lb

## 2017-10-27 DIAGNOSIS — Z369 Encounter for antenatal screening, unspecified: Secondary | ICD-10-CM | POA: Diagnosis not present

## 2017-10-27 DIAGNOSIS — Z3689 Encounter for other specified antenatal screening: Secondary | ICD-10-CM | POA: Insufficient documentation

## 2017-10-27 DIAGNOSIS — Z3A12 12 weeks gestation of pregnancy: Secondary | ICD-10-CM | POA: Insufficient documentation

## 2017-10-27 NOTE — Progress Notes (Addendum)
Mar Daring Length of Consultation: 30 minutes   Cynthia Glover  was referred to [place] for genetic counseling to review prenatal screening and testing options.  This note summarizes the information we discussed.    We offered the following routine screening tests for this pregnancy:  First trimester screening, which includes nuchal translucency ultrasound screen and first trimester maternal serum marker screening.  The nuchal translucency has approximately an 80% detection rate for Down syndrome and can be positive for other chromosome abnormalities as well as congenital heart defects.  When combined with a maternal serum marker screening, the detection rate is up to 90% for Down syndrome and up to 97% for trisomy 18.     Maternal serum marker screening, a blood test that measures pregnancy proteins, can provide risk assessments for Down syndrome, trisomy 18, and open neural tube defects (spina bifida, anencephaly). Because it does not directly examine the fetus, it cannot positively diagnose or rule out these problems.  Targeted ultrasound uses high frequency sound waves to create an image of the developing fetus.  An ultrasound is often recommended as a routine means of evaluating the pregnancy.  It is also used to screen for fetal anatomy problems (for example, a heart defect) that might be suggestive of a chromosomal or other abnormality.   Should these screening tests indicate an increased concern, then the following additional testing options would be offered:  The chorionic villus sampling procedure is available for first trimester chromosome analysis.  This involves the withdrawal of a small amount of chorionic villi (tissue from the developing placenta).  Risk of pregnancy loss is estimated to be approximately 1 in 200 to 1 in 100 (0.5 to 1%).  There is approximately a 1% (1 in 100) chance that the CVS chromosome results will be unclear.  Chorionic  villi cannot be tested for neural tube defects.     Amniocentesis involves the removal of a small amount of amniotic fluid from the sac surrounding the fetus with the use of a thin needle inserted through the maternal abdomen and uterus.  Ultrasound guidance is used throughout the procedure.  Fetal cells from amniotic fluid are directly evaluated and > 99.5% of chromosome problems and > 98% of open neural tube defects can be detected. This procedure is generally performed after the 15th week of pregnancy.  The main risks to this procedure include complications leading to miscarriage in less than 1 in 200 cases (0.5%).  As another option for information if the pregnancy is suspected to be an an increased chance for certain chromosome conditions, we also reviewed the availability of cell free fetal DNA testing from maternal blood to determine whether or not the baby may have either Down syndrome, trisomy 36, or trisomy 76.  This test utilizes a maternal blood sample and DNA sequencing technology to isolate circulating cell free fetal DNA from maternal plasma.  The fetal DNA can then be analyzed for DNA sequences that are derived from the three most common chromosomes involved in aneuploidy, chromosomes 13, 18, and 21.  If the overall amount of DNA is greater than the expected level for any of these chromosomes, aneuploidy is suspected.  While we do not consider it a replacement for invasive testing and karyotype analysis, a negative result from this testing would be reassuring, though not a guarantee of a normal chromosome complement for the baby.  An abnormal result is certainly suggestive of an abnormal chromosome complement, though we would still  recommend CVS or amniocentesis to confirm any findings from this testing.  Cystic Fibrosis and Spinal Muscular Atrophy (SMA) screening were also discussed with the patient. Both conditions are recessive, which means that both parents must be carriers in order to have  a child with the disease.  Cystic fibrosis (CF) is one of the most common genetic conditions in persons of Caucasian ancestry.  This condition occurs in approximately 1 in 2,500 Caucasian persons and results in thickened secretions in the lungs, digestive, and reproductive systems.  For a baby to be at risk for having CF, both of the parents must be carriers for this condition.  Approximately 1 in 34 Caucasian persons is a carrier for CF.  Current carrier testing looks for the most common mutations in the gene for CF and can detect approximately 90% of carriers in the Caucasian population.  This means that the carrier screening can greatly reduce, but cannot eliminate, the chance for an individual to have a child with CF.  If an individual is found to be a carrier for CF, then carrier testing would be available for the partner. As part of [place] newborn screening profile, all babies born in the state of [place] will have a two-tier screening process.  Specimens are first tested to determine the concentration of immunoreactive trypsinogen (IRT).  The top 5% of specimens with the highest IRT values then undergo DNA testing using a panel of over 40 common CF mutations. SMA is a neurodegenerative disorder that leads to atrophy of skeletal muscle and overall weakness.  This condition is also more prevalent in the Caucasian population, with 1 in 40-1 in 60 persons being a carrier and 1 in 6,000-1 in 10,000 children being affected.  There are multiple forms of the disease, with some causing death in infancy to other forms with survival into adulthood.  The genetics of SMA is complex, but carrier screening can detect up to 95% of carriers in the Caucasian population.  Similar to CF, a negative result can greatly reduce, but cannot eliminate, the chance to have a child with SMA.  We obtained a detailed family history and pregnancy history.  The remainder of the family history was reported to be  unremarkable for birth defects, intellectual delays, recurrent pregnancy loss or known chromosome abnormalities.  Cynthia Glover stated that this is her second pregnancy.  The couple has a healthy 46 year old daughter.  She reported no complications or exposures in this pregnancy that would be expected to increase the risk for birth defects.  After consideration of the options, Cynthia Glover elected to proceed with first trimester screening and to declined CF and SMA carrier screening.  An ultrasound was performed at the time of the visit.  The gestational age was consistent with 12 weeks.  Fetal anatomy could not be assessed due to early gestational age.  Please refer to the ultrasound report for details of that study.  Cynthia Glover was encouraged to call with questions or concerns.  We can be contacted at [phone].    Wilburt Finlay, MS, CGC I met with the patient and agree with the screening options as discussed.  Gatha Mayer, MD

## 2017-11-03 ENCOUNTER — Telehealth: Payer: Self-pay | Admitting: Obstetrics and Gynecology

## 2017-11-03 NOTE — Telephone Encounter (Signed)
Ms. Mathenia  elected to undergo First Trimester screening on 10/27/2017.  To review, first trimester screening, includes nuchal translucency ultrasound screen and/or first trimester maternal serum marker screening.  The nuchal translucency has approximately an 80% detection rate for Down syndrome and can be positive for other chromosome abnormalities as well as heart defects.  When combined with a maternal serum marker screening, the detection rate is up to 90% for Down syndrome and up to 97% for trisomy 13 and 18.     The results of the First Trimester Nuchal Translucency and Biochemical Screening were within normal range.  The risk for Down syndrome is now estimated to be 1 in 7,147.  The risk for Trisomy 13/18 is 1 in >10,000.  Should more definitive information be desired, we would offer amniocentesis.  Because we do not yet know the effectiveness of combined first and second trimester screening, we do not recommend a maternal serum screen to assess the chance for chromosome conditions.  However, if screening for neural tube defects is desired, maternal serum screening for AFP only can be performed between 15 and [redacted] weeks gestation.     Wilburt Finlay, MS, CGC

## 2017-11-15 NOTE — L&D Delivery Note (Signed)
Date of delivery: 04/21/18 Estimated Date of Delivery: 05/07/18 Patient's last menstrual period was 07/31/2017. EGA: [redacted]w[redacted]d  Delivery Note At 4:08 AM a viable female was delivered via Vaginal, Spontaneous (Presentation: direct OA; cephalic).  APGAR: 8, 9; weight pending (skin to skin).  Placenta status: spontaneous, intact, centric insertion.  Cord:  with the following complications: nuchal x1.  Cord pH: not collected  Anesthesia:  epidural Episiotomy:  none Lacerations:  none Suture Repair: n/a Est. Blood Loss (mL):  350cc  Mom presented to L&D with labor, augmented with pitocin.  epidual placed. Progressed to complete, second stage: 2 pushes with delivery of fetal head with restitution to LOA.   Anterior then posterior shoulders delivered without difficulty. Nuchal cord reduced after delivery of body.  Baby placed on mom's chest, and attended to by peds.  We sang happy birthday to baby Vonna Kotyk.  Cord was then clamped and cut when pulseless.  Placenta spontaneously delivered, intact.   IV pitocin given for hemorrhage prophylaxis.  Mom to postpartum.  Baby to Couplet care / Skin to Skin.  Lonnetta Kniskern C Kearstyn Avitia 04/21/2018, 4:36 AM

## 2017-11-21 ENCOUNTER — Emergency Department
Admission: EM | Admit: 2017-11-21 | Discharge: 2017-11-21 | Disposition: A | Discharge status: Home / Self Care | Payer: Medicaid Other | Attending: Emergency Medicine | Admitting: Emergency Medicine

## 2017-11-21 ENCOUNTER — Encounter: Payer: Self-pay | Admitting: Emergency Medicine

## 2017-11-21 ENCOUNTER — Other Ambulatory Visit: Payer: Self-pay

## 2017-11-21 DIAGNOSIS — Z87891 Personal history of nicotine dependence: Secondary | ICD-10-CM | POA: Diagnosis not present

## 2017-11-21 DIAGNOSIS — O99512 Diseases of the respiratory system complicating pregnancy, second trimester: Secondary | ICD-10-CM | POA: Insufficient documentation

## 2017-11-21 DIAGNOSIS — Z79899 Other long term (current) drug therapy: Secondary | ICD-10-CM | POA: Insufficient documentation

## 2017-11-21 DIAGNOSIS — O99322 Drug use complicating pregnancy, second trimester: Secondary | ICD-10-CM | POA: Insufficient documentation

## 2017-11-21 DIAGNOSIS — R0981 Nasal congestion: Secondary | ICD-10-CM | POA: Insufficient documentation

## 2017-11-21 DIAGNOSIS — O219 Vomiting of pregnancy, unspecified: Secondary | ICD-10-CM | POA: Diagnosis present

## 2017-11-21 DIAGNOSIS — O99342 Other mental disorders complicating pregnancy, second trimester: Secondary | ICD-10-CM | POA: Diagnosis not present

## 2017-11-21 DIAGNOSIS — F419 Anxiety disorder, unspecified: Secondary | ICD-10-CM | POA: Diagnosis not present

## 2017-11-21 DIAGNOSIS — F329 Major depressive disorder, single episode, unspecified: Secondary | ICD-10-CM | POA: Diagnosis not present

## 2017-11-21 DIAGNOSIS — F1590 Other stimulant use, unspecified, uncomplicated: Secondary | ICD-10-CM | POA: Diagnosis not present

## 2017-11-21 DIAGNOSIS — Z3A16 16 weeks gestation of pregnancy: Secondary | ICD-10-CM | POA: Diagnosis not present

## 2017-11-21 LAB — URINALYSIS, COMPLETE (UACMP) WITH MICROSCOPIC
Bilirubin Urine: NEGATIVE
GLUCOSE, UA: NEGATIVE mg/dL
HGB URINE DIPSTICK: NEGATIVE
KETONES UR: 5 mg/dL — AB
NITRITE: NEGATIVE
PROTEIN: 30 mg/dL — AB
Specific Gravity, Urine: 1.024 (ref 1.005–1.030)
pH: 5 (ref 5.0–8.0)

## 2017-11-21 LAB — COMPREHENSIVE METABOLIC PANEL
ALBUMIN: 3.8 g/dL (ref 3.5–5.0)
ALT: 17 U/L (ref 14–54)
ANION GAP: 9 (ref 5–15)
AST: 23 U/L (ref 15–41)
Alkaline Phosphatase: 59 U/L (ref 38–126)
BUN: 6 mg/dL (ref 6–20)
CO2: 20 mmol/L — AB (ref 22–32)
Calcium: 9.2 mg/dL (ref 8.9–10.3)
Chloride: 104 mmol/L (ref 101–111)
Creatinine, Ser: 0.39 mg/dL — ABNORMAL LOW (ref 0.44–1.00)
GFR calc Af Amer: >60 mL/min (ref >60)
GFR calc non Af Amer: >60 mL/min (ref >60)
GLUCOSE: 92 mg/dL (ref 65–99)
POTASSIUM: 3.4 mmol/L — AB (ref 3.5–5.1)
SODIUM: 133 mmol/L — AB (ref 135–145)
TOTAL PROTEIN: 7.5 g/dL (ref 6.5–8.1)
Total Bilirubin: 0.8 mg/dL (ref 0.3–1.2)

## 2017-11-21 LAB — CBC
HCT: 38.2 % (ref 35.0–47.0)
HEMOGLOBIN: 13.4 g/dL (ref 12.0–16.0)
MCH: 31 pg (ref 26.0–34.0)
MCHC: 35.1 g/dL (ref 32.0–36.0)
MCV: 88.3 fL (ref 80.0–100.0)
Platelets: 196 10*3/uL (ref 150–440)
RBC: 4.33 MIL/uL (ref 3.80–5.20)
RDW: 13.2 % (ref 11.5–14.5)
WBC: 8.3 10*3/uL (ref 3.6–11.0)

## 2017-11-21 LAB — LIPASE, BLOOD: LIPASE: 18 U/L (ref 11–51)

## 2017-11-21 LAB — BETA-HYDROXYBUTYRIC ACID: Beta-Hydroxybutyric Acid: 0.14 mmol/L (ref 0.05–0.27)

## 2017-11-21 LAB — HCG, QUANTITATIVE, PREGNANCY: HCG, BETA CHAIN, QUANT, S: 46062 m[IU]/mL — AB (ref <5)

## 2017-11-21 MED ORDER — METOCLOPRAMIDE HCL 10 MG PO TABS: 10.0000 mg | ORAL_TABLET | Freq: Three times a day (TID) | ORAL | 0 refills | Status: DC

## 2017-11-21 MED ORDER — ONDANSETRON 4 MG PO TBDP: 4.0000 mg | ORAL_TABLET | Freq: Three times a day (TID) | ORAL | 0 refills | Status: DC | PRN

## 2017-11-21 MED ORDER — METOCLOPRAMIDE HCL 5 MG/ML IJ SOLN
20.0000 mg | Freq: Once | INTRAVENOUS | Status: AC
  Administered 2017-11-21: 20 mg via INTRAVENOUS
  Filled 2017-11-21: qty 4

## 2017-11-21 MED ORDER — PROMETHAZINE HCL 25 MG RE SUPP: 25.0000 mg | Freq: Four times a day (QID) | RECTAL | 0 refills | Status: DC | PRN

## 2017-11-21 MED ORDER — DEXTROSE 5 % AND 0.9 % NACL IV BOLUS
1000.0000 mL | Freq: Once | INTRAVENOUS | Status: AC
  Administered 2017-11-21: 1000 mL via INTRAVENOUS
  Filled 2017-11-21: qty 1000

## 2017-11-21 NOTE — ED Notes (Addendum)
Offered for patient to stay and receive the remaining 400cc of fluid infusing through PIV. Pt states that she is ready to go home, nausea better.  Pt alert and oriented X4, active, cooperative, pt in NAD. RR even and unlabored, color WNL.  Pt informed to return if any life threatening symptoms occur.  Discharge and followup instructions reviewed. Pt left with all of belongings.

## 2017-11-21 NOTE — ED Notes (Signed)
Awaiting medications from pharmacy.

## 2017-11-21 NOTE — ED Notes (Signed)
Awaiting IV fluids from pharmacy.

## 2017-11-21 NOTE — ED Provider Notes (Signed)
[facility] Emergency Department Provider Note  ____________________________________________   First MD Initiated Contact with Patient 11/21/17 1127     (approximate)  I have reviewed the triage vital signs and the nursing notes.   HISTORY Chief Complaint Emesis   HPI Cynthia Glover is a 27 y.o. female who comes to the emergency department with 1-2 days of worsening nausea vomiting and difficulty keeping food down.  She is roughly [redacted] weeks pregnant with a desired pregnancy.  She did have some mild morning sickness in her first trimester but nothing has been as bad as this past day or 2.  His symptoms are now moderate to severe.  They are worsened when trying to eat and somewhat improved with antiemetics.  Her OB gynecologist has prescribed her doxylamine as well as Zofran tablets which the patient has had difficulty keeping down.  She has mild cramping upper abdominal pain nonradiating.  Worse with eating improved with not eating.  Past Medical History:  Diagnosis Date  . Anxiety   . Depression     Patient Active Problem List   Diagnosis Date Noted  . First trimester screening   . 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 Obesity 03/31/2016  . BMI 32.0-32.9,adult 03/31/2016  . Not immune to rubella 06/06/2014    History reviewed. No pertinent surgical history.  Prior to Admission medications   Medication Sig Start Date End Date Taking? Authorizing Provider  buPROPion (WELLBUTRIN SR) 150 MG 12 hr tablet Take 1 tablet (150 mg total) by mouth daily. Patient not taking: Reported on 10/27/2017 09/29/16   Mar Daring, PA-C  clonazePAM (KLONOPIN) 0.5 MG tablet Take 1 tablet (0.5 mg total) by mouth 3 (three) times daily as needed for anxiety. Patient not taking: Reported on 10/27/2017 09/29/16   Mar Daring, PA-C  doxylamine, Sleep, (UNISOM) 25 MG tablet Take 25 mg by mouth at bedtime as needed.    [provider]  metoCLOPramide (REGLAN) 10 MG tablet Take 1 tablet (10 mg total) by mouth 3 (three) times daily with meals. 11/21/17 11/21/18  Darel Hong, MD  ondansetron (ZOFRAN ODT) 4 MG disintegrating tablet Take 1 tablet (4 mg total) by mouth every 8 (eight) hours as needed for nausea or vomiting. Patient not taking: Reported on 10/27/2017 05/10/17   Letitia Neri L, PA-C  ondansetron (ZOFRAN ODT) 4 MG disintegrating tablet Take 1 tablet (4 mg total) by mouth every 8 (eight) hours as needed for nausea or vomiting. 11/21/17   Darel Hong, MD  Prenatal Vit-Fe Fumarate-FA (PRENATAL MULTIVITAMIN) TABS tablet Take 1 tablet by mouth daily at 12 noon.    [provider]  promethazine (PHENERGAN) 25 MG suppository Place 1 suppository (25 mg total) rectally every 6 (six) hours as needed for nausea or vomiting. 11/21/17   Darel Hong, MD  pyridoxine (B-6) 100 MG tablet Take 100 mg by mouth daily.    [provider]  sertraline (ZOLOFT) 25 MG tablet Take 1 tablet (25 mg total) by mouth at bedtime. Patient not taking: Reported on 10/27/2017 09/29/16   Mar Daring, PA-C    Allergies Penicillins  Family History  Problem Relation Age of Onset  . Depression Father   . Diabetes Father   . Depression Brother   . Anxiety disorder Brother     Social History Social History   Tobacco Use  . Smoking status: Former Research scientist (life sciences)  . Smokeless tobacco: Never Used  . Tobacco comment: Quit in 2015.  Smoked for 5 years.  Substance Use Topics  . Alcohol use: No  . Drug use: No    Review of Systems Constitutional: No fever/chills Eyes: No visual changes. ENT: No sore throat. Cardiovascular: Denies chest pain. Respiratory: Denies shortness of breath. Gastrointestinal: Positive for abdominal pain.  Positive for nausea, poor vomiting.  No diarrhea.  No constipation. Genitourinary: Negative for dysuria. Musculoskeletal: Negative for back pain. Skin: Negative for rash. Neurological:  Negative for headaches, focal weakness or numbness.   ____________________________________________   PHYSICAL EXAM:  VITAL SIGNS: ED Triage Vitals [11/21/17 0950]  Enc Vitals Group     BP 116/76     Pulse Rate (!) 102     Resp 18     Temp 99 F (37.2 C)     Temp Source Oral     SpO2 98 %     Weight 195 lb (88.5 kg)     Height 5\' 8"  (1.727 m)     Head Circumference      Peak Flow      Pain Score      Pain Loc      Pain Edu?      Excl. in [place]?     Constitutional: Alert and oriented x4 appears somewhat uncomfortable nontoxic no diaphoresis speaks in full clear sentences Eyes: PERRL EOMI. Head: Atraumatic. Nose: No congestion/rhinnorhea. Mouth/Throat: No trismus Neck: No stridor.   Cardiovascular: Normal rate, regular rhythm. Grossly normal heart sounds.  Good peripheral circulation. Respiratory: Normal respiratory effort.  No retractions. Lungs CTAB and moving good air Gastrointestinal: Gravid abdomen soft nontender Musculoskeletal: No lower extremity edema   Neurologic:  Normal speech and language. No gross focal neurologic deficits are appreciated. Skin:  Skin is warm, dry and intact. No rash noted. Psychiatric: Mood and affect are normal. Speech and behavior are normal.    ____________________________________________   DIFFERENTIAL includes but not limited to  Morning sickness, dehydration, hyperemesis gravidarum ____________________________________________   LABS (all labs ordered are listed, but only abnormal results are displayed)  Labs Reviewed  COMPREHENSIVE METABOLIC PANEL - Abnormal; Notable for the following components:      Result Value   Sodium 133 (*)    Potassium 3.4 (*)    CO2 20 (*)    Creatinine, Ser 0.39 (*)    All other components within normal limits  URINALYSIS, COMPLETE (UACMP) WITH MICROSCOPIC - Abnormal; Notable for the following components:   Color, Urine AMBER (*)    APPearance HAZY (*)    Ketones, ur 5 (*)    Protein, ur 30 (*)     Leukocytes, UA TRACE (*)    Bacteria, UA RARE (*)    Squamous Epithelial / LPF 6-30 (*)    All other components within normal limits  HCG, QUANTITATIVE, PREGNANCY - Abnormal; Notable for the following components:   hCG, Beta Chain, Quant, S 46,062 (*)    All other components within normal limits  LIPASE, BLOOD  CBC  BETA-HYDROXYBUTYRIC ACID    Lab work reviewed by me shows that she is pregnant and not significantly dehydrated nor start __________________________________________  EKG   ____________________________________________  RADIOLOGY   ____________________________________________   PROCEDURES  Procedure(s) performed: no  Procedures  Critical Care performed: no  Observation: no ____________________________________________   INITIAL IMPRESSION / ASSESSMENT AND PLAN / ED COURSE  Pertinent labs & imaging results that were available during my care of the patient were reviewed by me and considered in my medical decision making (see chart for details).       -----------------------------------------  1:28 PM on 11/21/2017 -----------------------------------------  The patient feels significantly improved and is able to keep food and water down.  She is not sure what prescriptions her insurance will cover so I will prescribe her ODT Zofran, rectal Phenergan, as well as oral Reglan with the understanding that she is only to fill 1 of them which ever one her insurance will cover.  Strict return precautions have been given and I will refer her back to [facility] gynecology.  She verbalizes understanding and agreement the plan. ____________________________________________   FINAL CLINICAL IMPRESSION(S) / ED DIAGNOSES  Final diagnoses:  Nausea and vomiting in pregnancy  Nasal congestion      NEW MEDICATIONS STARTED DURING THIS VISIT:  This SmartLink is deprecated. Use AVSMEDLIST instead to display the medication list for a patient.   Note:  This document was  prepared using Dragon voice recognition software and may include unintentional dictation errors.     Darel Hong, MD 11/22/17 1035

## 2017-11-21 NOTE — ED Notes (Signed)
NV that started yesterday. Constipation  X 2 days. URI sx, nasal congestion and drainage per patient. Pt [redacted] weeks pregnant. No abdominal pain, vaginal bleeding, urinary sx. Pt alert and oriented X4, active, cooperative, pt in NAD. RR even and unlabored, color WNL.

## 2017-11-21 NOTE — ED Notes (Signed)
Still have not received IV fluids; called pharmacy again. Assured that fluids will be made and sent. EDP aware.

## 2017-11-21 NOTE — Discharge Instructions (Signed)
Fortunately today your blood work is reassuring and you were only mildly dehydrated.  Please feel only 1 of the prescriptions I have provided you either the Zofran that dissolves, the rectal Phenergan, or the Reglan.  Please follow-up with both your primary care physician and your [facility] gynecologist as scheduled.  Return to the emergency department for any concerns such as fevers, chills, chest pain, shortness of breath, if you cannot eat or drink, or for any other issues whatsoever.  It was a pleasure to take care of you today, and thank you for coming to our emergency department.  If you have any questions or concerns before leaving please ask the nurse to grab me and I'm more than happy to go through your aftercare instructions again.  If you were prescribed any opioid pain medication today such as Norco, Vicodin, Percocet, morphine, hydrocodone, or oxycodone please make sure you do not drive when you are taking this medication as it can alter your ability to drive safely.  If you have any concerns once you are home that you are not improving or are in fact getting worse before you can make it to your follow-up appointment, please do not hesitate to call 911 and come back for further evaluation.  Darel Hong, MD  Results for orders placed or performed during the hospital encounter of 11/21/17  Lipase, blood  Result Value Ref Range   Lipase 18 11 - 51 U/L  Comprehensive metabolic panel  Result Value Ref Range   Sodium 133 (L) 135 - 145 mmol/L   Potassium 3.4 (L) 3.5 - 5.1 mmol/L   Chloride 104 101 - 111 mmol/L   CO2 20 (L) 22 - 32 mmol/L   Glucose, Bld 92 65 - 99 mg/dL   BUN 6 6 - 20 mg/dL   Creatinine, Ser 0.39 (L) 0.44 - 1.00 mg/dL   Calcium 9.2 8.9 - 10.3 mg/dL   Total Protein 7.5 6.5 - 8.1 g/dL   Albumin 3.8 3.5 - 5.0 g/dL   AST 23 15 - 41 U/L   ALT 17 14 - 54 U/L   Alkaline Phosphatase 59 38 - 126 U/L   Total Bilirubin 0.8 0.3 - 1.2 mg/dL   GFR calc non Af Amer >60 >60 mL/min     GFR calc Af Amer >60 >60 mL/min   Anion gap 9 5 - 15  CBC  Result Value Ref Range   WBC 8.3 3.6 - 11.0 K/uL   RBC 4.33 3.80 - 5.20 MIL/uL   Hemoglobin 13.4 12.0 - 16.0 g/dL   HCT 38.2 35.0 - 47.0 %   MCV 88.3 80.0 - 100.0 fL   MCH 31.0 26.0 - 34.0 pg   MCHC 35.1 32.0 - 36.0 g/dL   RDW 13.2 11.5 - 14.5 %   Platelets 196 150 - 440 K/uL  Urinalysis, Complete w Microscopic  Result Value Ref Range   Color, Urine AMBER (A) YELLOW   APPearance HAZY (A) CLEAR   Specific Gravity, Urine 1.024 1.005 - 1.030   pH 5.0 5.0 - 8.0   Glucose, UA NEGATIVE NEGATIVE mg/dL   Hgb urine dipstick NEGATIVE NEGATIVE   Bilirubin Urine NEGATIVE NEGATIVE   Ketones, ur 5 (A) NEGATIVE mg/dL   Protein, ur 30 (A) NEGATIVE mg/dL   Nitrite NEGATIVE NEGATIVE   Leukocytes, UA TRACE (A) NEGATIVE   RBC / HPF 0-5 0 - 5 RBC/hpf   WBC, UA 6-30 0 - 5 WBC/hpf   Bacteria, UA RARE (A) NONE SEEN  Squamous Epithelial / LPF 6-30 (A) NONE SEEN   Mucus PRESENT   hCG, quantitative, pregnancy  Result Value Ref Range   hCG, Beta Chain, Quant, S 46,062 (H) <5 mIU/mL  Beta-hydroxybutyric acid  Result Value Ref Range   Beta-Hydroxybutyric Acid 0.14 0.05 - 0.27 mmol/L   [place] Mfm Fetal Nuchal Translucency  Result Date: 10/27/2017 ----------------------------------------------------------------------  OBSTETRICS REPORT                      (Signed Final 10/27/2017 04:09 pm) ---------------------------------------------------------------------- PATIENT INFO:  ID #:       [id number]                          D.O.B.:  03-Oct-1991 (26 yrs)  Name:       Cynthia Glover [facility]                   Visit Date: 10/27/2017 03:23 pm ---------------------------------------------------------------------- PERFORMED BY:  Performed By:     Christena Deem RDMS        Referred By:      Lisette Grinder ---------------------------------------------------------------------- SERVICE(S) PROVIDED:   [place] MFM FETAL  NUCHAL TRANSLUCENCY                     31517.6  ---------------------------------------------------------------------- INDICATIONS:   [redacted] weeks gestation of pregnancy                Z3A.12  ---------------------------------------------------------------------- FETAL EVALUATION:  Num Of Fetuses:     1  Fetal Heart         157  Rate(bpm):  Cardiac Activity:   Present  Presentation:       Variable  Placenta:           Anterior ---------------------------------------------------------------------- BIOMETRY:  CRL:      69.1  mm     G. Age:  13w 1d                  EDD:   05/03/18  NFT:       1.2  mm ---------------------------------------------------------------------- GESTATIONAL AGE:  LMP:           12w 4d        Date:  07/31/17                 EDD:   05/07/18  Best:          12w 4d     Det. By:  LMP  (07/31/17)          EDD:   05/07/18 ---------------------------------------------------------------------- IMPRESSION:  Dear Ms. HAVILAND,  Thank you for referring your patient t for  first trimester nuchal  translucency screening .  A singleton gestation is noted.  The fetal biometry correlates with established dating  of 12w  4d. Her LMP is certain and confirmed by early scan  on11/5/18 at 7w 0d giving a best EDC of 05/07/18 .  The nuchal translucency measured  1.2 mm.  The fetal nasal bone was noted to be present.  The ovaries were WNL.  The patient was scheduled to have blood  drawn for first  trimester screening.  A composite risk incorporating her age, nuchal translucency  measurement and blood results should be returned to your  office shortly ([facility]).  Thank you for allowing [place] to participate in your patient's care.  Please do not hesitate to contact us if we can be of further  assistance. ----------------------------------------------------------------------              Gatha Mayer, MD Electronically Signed Final Report   10/27/2017 04:09 pm  ----------------------------------------------------------------------

## 2017-11-21 NOTE — ED Triage Notes (Signed)
Nausea and vomiting began yesterday. 69 weeke pregnant with no abd pain or vag bleeding.

## 2017-12-08 ENCOUNTER — Other Ambulatory Visit: Payer: Self-pay | Admitting: Obstetrics and Gynecology

## 2017-12-08 DIAGNOSIS — O4690 Antepartum hemorrhage, unspecified, unspecified trimester: Secondary | ICD-10-CM

## 2017-12-12 ENCOUNTER — Ambulatory Visit
Admission: RE | Admit: 2017-12-12 | Discharge: 2017-12-12 | Disposition: A | Discharge status: Home / Self Care | Payer: Medicaid Other | Source: Ambulatory Visit | Attending: Obstetrics and Gynecology | Admitting: Obstetrics and Gynecology

## 2017-12-12 DIAGNOSIS — O4690 Antepartum hemorrhage, unspecified, unspecified trimester: Secondary | ICD-10-CM

## 2018-01-09 DIAGNOSIS — G43009 Migraine without aura, not intractable, without status migrainosus: Secondary | ICD-10-CM | POA: Insufficient documentation

## 2018-04-13 ENCOUNTER — Inpatient Hospital Stay
Admission: EM | Admit: 2018-04-13 | Discharge: 2018-04-13 | Disposition: A | Discharge status: Home / Self Care | Payer: No Typology Code available for payment source | Attending: Obstetrics and Gynecology | Admitting: Obstetrics and Gynecology

## 2018-04-13 ENCOUNTER — Encounter: Payer: Self-pay | Admitting: Emergency Medicine

## 2018-04-13 DIAGNOSIS — O99343 Other mental disorders complicating pregnancy, third trimester: Secondary | ICD-10-CM | POA: Diagnosis not present

## 2018-04-13 DIAGNOSIS — Z87891 Personal history of nicotine dependence: Secondary | ICD-10-CM | POA: Diagnosis not present

## 2018-04-13 DIAGNOSIS — Z6832 Body mass index (BMI) 32.0-32.9, adult: Secondary | ICD-10-CM | POA: Insufficient documentation

## 2018-04-13 DIAGNOSIS — Y92410 Unspecified street and highway as the place of occurrence of the external cause: Secondary | ICD-10-CM | POA: Insufficient documentation

## 2018-04-13 DIAGNOSIS — O99213 Obesity complicating pregnancy, third trimester: Secondary | ICD-10-CM | POA: Diagnosis not present

## 2018-04-13 DIAGNOSIS — E669 Obesity, unspecified: Secondary | ICD-10-CM | POA: Insufficient documentation

## 2018-04-13 DIAGNOSIS — F329 Major depressive disorder, single episode, unspecified: Secondary | ICD-10-CM | POA: Diagnosis not present

## 2018-04-13 DIAGNOSIS — R1031 Right lower quadrant pain: Secondary | ICD-10-CM | POA: Insufficient documentation

## 2018-04-13 DIAGNOSIS — O99891 Other specified diseases and conditions complicating pregnancy: Secondary | ICD-10-CM

## 2018-04-13 DIAGNOSIS — R1032 Left lower quadrant pain: Secondary | ICD-10-CM | POA: Diagnosis not present

## 2018-04-13 DIAGNOSIS — Z880 Allergy status to penicillin: Secondary | ICD-10-CM | POA: Diagnosis not present

## 2018-04-13 DIAGNOSIS — O26893 Other specified pregnancy related conditions, third trimester: Secondary | ICD-10-CM | POA: Diagnosis not present

## 2018-04-13 DIAGNOSIS — R1030 Lower abdominal pain, unspecified: Secondary | ICD-10-CM | POA: Diagnosis present

## 2018-04-13 DIAGNOSIS — F419 Anxiety disorder, unspecified: Secondary | ICD-10-CM | POA: Diagnosis not present

## 2018-04-13 DIAGNOSIS — Z3A37 37 weeks gestation of pregnancy: Secondary | ICD-10-CM | POA: Diagnosis not present

## 2018-04-13 DIAGNOSIS — M545 Low back pain, unspecified: Secondary | ICD-10-CM

## 2018-04-13 DIAGNOSIS — O9989 Other specified diseases and conditions complicating pregnancy, childbirth and the puerperium: Secondary | ICD-10-CM | POA: Diagnosis present

## 2018-04-13 LAB — FETAL FIBRONECTIN: FETAL FIBRONECTIN: POSITIVE — AB

## 2018-04-13 MED ORDER — LACTATED RINGERS IV SOLN: 500.0000 mL/h | Freq: Once | INTRAVENOUS | Status: DC

## 2018-04-13 MED ORDER — DOCUSATE SODIUM 100 MG PO CAPS: 100.0000 mg | ORAL_CAPSULE | Freq: Every day | ORAL | Status: DC

## 2018-04-13 MED ORDER — ACETAMINOPHEN 325 MG PO TABS
650.0000 mg | ORAL_TABLET | Freq: Once | ORAL | Status: AC
  Administered 2018-04-13: 650 mg via ORAL
  Filled 2018-04-13: qty 2

## 2018-04-13 MED ORDER — ACETAMINOPHEN 325 MG PO TABS
650.0000 mg | ORAL_TABLET | ORAL | Status: DC | PRN
  Administered 2018-04-13: 650 mg via ORAL
  Filled 2018-04-13: qty 2

## 2018-04-13 MED ORDER — BETAMETHASONE SOD PHOS & ACET 6 (3-3) MG/ML IJ SUSP
INTRAMUSCULAR | Status: AC
  Administered 2018-04-13: 12 mg via INTRAMUSCULAR
  Filled 2018-04-13: qty 1

## 2018-04-13 MED ORDER — PRENATAL MULTIVITAMIN CH: 1.0000 | ORAL_TABLET | Freq: Every day | ORAL | Status: DC

## 2018-04-13 MED ORDER — ZOLPIDEM TARTRATE 5 MG PO TABS: 5.0000 mg | ORAL_TABLET | Freq: Every evening | ORAL | Status: DC | PRN

## 2018-04-13 MED ORDER — CYCLOBENZAPRINE HCL 10 MG PO TABS
10.0000 mg | ORAL_TABLET | Freq: Three times a day (TID) | ORAL | Status: DC | PRN
  Administered 2018-04-13: 10 mg via ORAL
  Filled 2018-04-13: qty 1

## 2018-04-13 MED ORDER — CALCIUM CARBONATE ANTACID 500 MG PO CHEW: 2.0000 | CHEWABLE_TABLET | ORAL | Status: DC | PRN

## 2018-04-13 MED ORDER — BETAMETHASONE SOD PHOS & ACET 6 (3-3) MG/ML IJ SUSP
12.0000 mg | INTRAMUSCULAR | Status: DC
  Administered 2018-04-13: 12 mg via INTRAMUSCULAR

## 2018-04-13 MED ORDER — BETAMETHASONE SOD PHOS & ACET 6 (3-3) MG/ML IJ SUSP: 12.0000 mg | INTRAMUSCULAR | Status: DC

## 2018-04-13 MED ORDER — TERBUTALINE SULFATE 1 MG/ML IJ SOLN: 0.2500 mg | Freq: Once | INTRAMUSCULAR | Status: DC | PRN
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
## 2018-04-13 NOTE — ED Notes (Signed)
Sitting on stretcher, only complaint is lower back pain, tylenol given per order. 2 cups of water given and tolerated well.
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
## 2018-04-13 NOTE — Discharge Instructions (Addendum)
May take Tylenol for your pain.  You may also use a heating pad or an ice pack to your low back for 10 minutes every 2 hours when you are awake to decrease pain.  Please continue to do your normal amount of activity but do not lift anything greater than 25 pounds until your back pain has resolved.  Return to the emergency department if you develop severe pain, numbness tingling or weakness, guilty walking, vaginal bleeding or gush of fluid, or any other symptoms concerning to you.

## 2018-04-13 NOTE — OB Triage Note (Signed)
Discharge instructions provided and reviewed.  Follow up care, including appt tomorrow AM, discussed.  Pt verbalized understanding.
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
## 2018-04-14 LAB — OB RESULTS CONSOLE GC/CHLAMYDIA
CHLAMYDIA, DNA PROBE: NEGATIVE
Gonorrhea: NEGATIVE

## 2018-04-14 LAB — OB RESULTS CONSOLE HIV ANTIBODY (ROUTINE TESTING): HIV: NONREACTIVE

## 2018-04-14 LAB — OB RESULTS CONSOLE GBS: GBS: NEGATIVE

## 2018-04-14 LAB — OB RESULTS CONSOLE RPR: RPR: NONREACTIVE

## 2018-04-15 LAB — CULTURE, BETA STREP (GROUP B ONLY)

## 2018-04-20 ENCOUNTER — Inpatient Hospital Stay: Payer: Medicaid Other | Admitting: Anesthesiology

## 2018-04-20 ENCOUNTER — Inpatient Hospital Stay
Admission: RE | Admit: 2018-04-20 | Discharge: 2018-04-22 | DRG: 807 | Disposition: A | Discharge status: Home / Self Care | Payer: Medicaid Other | Attending: Obstetrics & Gynecology | Admitting: Obstetrics & Gynecology

## 2018-04-20 ENCOUNTER — Other Ambulatory Visit: Payer: Self-pay

## 2018-04-20 DIAGNOSIS — Z87891 Personal history of nicotine dependence: Secondary | ICD-10-CM

## 2018-04-20 DIAGNOSIS — J069 Acute upper respiratory infection, unspecified: Secondary | ICD-10-CM | POA: Diagnosis present

## 2018-04-20 DIAGNOSIS — F419 Anxiety disorder, unspecified: Secondary | ICD-10-CM | POA: Diagnosis present

## 2018-04-20 DIAGNOSIS — O99214 Obesity complicating childbirth: Secondary | ICD-10-CM | POA: Diagnosis present

## 2018-04-20 DIAGNOSIS — Z3A37 37 weeks gestation of pregnancy: Secondary | ICD-10-CM

## 2018-04-20 DIAGNOSIS — O99344 Other mental disorders complicating childbirth: Secondary | ICD-10-CM | POA: Diagnosis present

## 2018-04-20 DIAGNOSIS — E669 Obesity, unspecified: Secondary | ICD-10-CM | POA: Diagnosis present

## 2018-04-20 DIAGNOSIS — F329 Major depressive disorder, single episode, unspecified: Secondary | ICD-10-CM | POA: Diagnosis present

## 2018-04-20 DIAGNOSIS — O9952 Diseases of the respiratory system complicating childbirth: Secondary | ICD-10-CM | POA: Diagnosis present

## 2018-04-20 DIAGNOSIS — Z3483 Encounter for supervision of other normal pregnancy, third trimester: Secondary | ICD-10-CM | POA: Diagnosis present

## 2018-04-20 LAB — CBC
HEMATOCRIT: 33.5 % — AB (ref 35.0–47.0)
Hemoglobin: 11.9 g/dL — ABNORMAL LOW (ref 12.0–16.0)
MCH: 29.5 pg (ref 26.0–34.0)
MCHC: 35.7 g/dL (ref 32.0–36.0)
MCV: 82.8 fL (ref 80.0–100.0)
Platelets: 241 10*3/uL (ref 150–440)
RBC: 4.04 MIL/uL (ref 3.80–5.20)
RDW: 14.9 % — AB (ref 11.5–14.5)
WBC: 9.7 10*3/uL (ref 3.6–11.0)

## 2018-04-20 LAB — TYPE AND SCREEN
ABO/RH(D): O POS
ANTIBODY SCREEN: NEGATIVE

## 2018-04-20 MED ORDER — MISOPROSTOL 200 MCG PO TABS
ORAL_TABLET | ORAL | Status: DC
Start: 2018-04-20 — End: 2018-04-21
  Filled 2018-04-20: qty 4

## 2018-04-20 MED ORDER — LIDOCAINE HCL (PF) 1 % IJ SOLN: 30.0000 mL | INTRAMUSCULAR | Status: DC | PRN

## 2018-04-20 MED ORDER — ONDANSETRON HCL 4 MG/2ML IJ SOLN
4.0000 mg | Freq: Four times a day (QID) | INTRAMUSCULAR | Status: DC | PRN
  Administered 2018-04-21: 4 mg via INTRAVENOUS
  Filled 2018-04-20: qty 2

## 2018-04-20 MED ORDER — LACTATED RINGERS IV SOLN
INTRAVENOUS | Status: DC
  Administered 2018-04-20 – 2018-04-21 (×2): via INTRAVENOUS

## 2018-04-20 MED ORDER — OXYTOCIN 40 UNITS IN LACTATED RINGERS INFUSION - SIMPLE MED
1.0000 m[IU]/min | INTRAVENOUS | Status: DC
  Administered 2018-04-20: 2 m[IU]/min via INTRAVENOUS
  Filled 2018-04-20: qty 1000

## 2018-04-20 MED ORDER — SOD CITRATE-CITRIC ACID 500-334 MG/5ML PO SOLN: 30.0000 mL | ORAL | Status: DC | PRN

## 2018-04-20 MED ORDER — OXYTOCIN 10 UNIT/ML IJ SOLN
INTRAMUSCULAR | Status: AC
  Filled 2018-04-20: qty 2

## 2018-04-20 MED ORDER — TERBUTALINE SULFATE 1 MG/ML IJ SOLN: 0.2500 mg | Freq: Once | INTRAMUSCULAR | Status: DC | PRN

## 2018-04-20 MED ORDER — LACTATED RINGERS IV SOLN
500.0000 mL | INTRAVENOUS | Status: DC | PRN
  Administered 2018-04-20: 500 mL via INTRAVENOUS

## 2018-04-20 MED ORDER — OXYTOCIN 40 UNITS IN LACTATED RINGERS INFUSION - SIMPLE MED
2.5000 [IU]/h | INTRAVENOUS | Status: DC
  Administered 2018-04-21: 2.5 [IU]/h via INTRAVENOUS

## 2018-04-20 MED ORDER — BUPIVACAINE HCL (PF) 0.25 % IJ SOLN
INTRAMUSCULAR | Status: DC | PRN
  Administered 2018-04-20 (×2): 5 mL via EPIDURAL

## 2018-04-20 MED ORDER — AMMONIA AROMATIC IN INHA
RESPIRATORY_TRACT | Status: AC
  Filled 2018-04-20: qty 10

## 2018-04-20 MED ORDER — LIDOCAINE-EPINEPHRINE (PF) 1.5 %-1:200000 IJ SOLN
INTRAMUSCULAR | Status: DC | PRN
  Administered 2018-04-20: 3 mL via EPIDURAL

## 2018-04-20 MED ORDER — FLUTICASONE PROPIONATE 50 MCG/ACT NA SUSP
2.0000 | NASAL | Status: DC | PRN
  Administered 2018-04-20: 2 via NASAL
  Filled 2018-04-20 (×2): qty 16

## 2018-04-20 MED ORDER — OXYTOCIN BOLUS FROM INFUSION
500.0000 mL | Freq: Once | INTRAVENOUS | Status: AC
  Administered 2018-04-21: 500 mL via INTRAVENOUS

## 2018-04-20 MED ORDER — FENTANYL 2.5 MCG/ML W/ROPIVACAINE 0.15% IN NS 100 ML EPIDURAL (ARMC)
EPIDURAL | Status: AC
  Filled 2018-04-20: qty 100

## 2018-04-20 MED ORDER — LIDOCAINE HCL (PF) 1 % IJ SOLN
INTRAMUSCULAR | Status: DC | PRN
  Administered 2018-04-20: 2 mL via INTRADERMAL

## 2018-04-20 MED ORDER — EPHEDRINE 5 MG/ML INJ: 10.0000 mg | INTRAVENOUS | Status: DC | PRN

## 2018-04-20 MED ORDER — ACETAMINOPHEN 500 MG PO TABS: 1000.0000 mg | ORAL_TABLET | Freq: Four times a day (QID) | ORAL | Status: DC | PRN

## 2018-04-20 MED ORDER — GUAIFENESIN 100 MG/5ML PO SOLN
5.0000 mL | ORAL | Status: DC | PRN
  Administered 2018-04-20: 100 mg via ORAL
  Filled 2018-04-20: qty 5

## 2018-04-20 MED ORDER — BUTORPHANOL TARTRATE 2 MG/ML IJ SOLN
1.0000 mg | INTRAMUSCULAR | Status: DC | PRN
  Administered 2018-04-20 (×2): 1 mg via INTRAVENOUS
  Filled 2018-04-20: qty 1

## 2018-04-20 MED ORDER — FENTANYL 2.5 MCG/ML W/ROPIVACAINE 0.15% IN NS 100 ML EPIDURAL (ARMC)
12.0000 mL/h | EPIDURAL | Status: DC
  Administered 2018-04-20 – 2018-04-21 (×2): 12 mL/h via EPIDURAL
  Filled 2018-04-20: qty 100

## 2018-04-20 MED ORDER — LACTATED RINGERS IV SOLN: 500.0000 mL | Freq: Once | INTRAVENOUS | Status: DC

## 2018-04-20 MED ORDER — DIPHENHYDRAMINE HCL 50 MG/ML IJ SOLN
12.5000 mg | INTRAMUSCULAR | Status: DC | PRN
  Administered 2018-04-21 (×2): 12.5 mg via INTRAVENOUS
  Filled 2018-04-20: qty 1

## 2018-04-20 MED ORDER — PHENYLEPHRINE 40 MCG/ML (10ML) SYRINGE FOR IV PUSH (FOR BLOOD PRESSURE SUPPORT): 80.0000 ug | PREFILLED_SYRINGE | INTRAVENOUS | Status: DC | PRN

## 2018-04-20 MED ORDER — LIDOCAINE HCL (PF) 1 % IJ SOLN
INTRAMUSCULAR | Status: AC
  Filled 2018-04-20: qty 30

## 2018-04-20 NOTE — Anesthesia Procedure Notes (Signed)
Epidural Patient location during procedure: OB Start time: 04/20/2018 9:30 PM End time: 04/20/2018 9:34 PM  Staffing Anesthesiologist: Idali Lafever, Precious Haws, MD Performed: anesthesiologist   Preanesthetic Checklist Completed: patient identified, site marked, surgical consent, pre-op evaluation, timeout performed, IV checked, risks and benefits discussed and monitors and equipment checked  Epidural Patient position: sitting Prep: ChloraPrep Patient monitoring: heart rate, continuous pulse ox and blood pressure Approach: midline Location: L3-L4 Injection technique: LOR saline  Needle:  Needle type: Tuohy  Needle gauge: 17 G Needle length: 9 cm and 9 Needle insertion depth: 5.5 cm Catheter type: closed end flexible Catheter size: 19 Gauge Catheter at skin depth: 9.5 cm Test dose: negative and 1.5% lidocaine with Epi 1:200 K  Assessment Sensory level: T10 Events: blood not aspirated, injection not painful, no injection resistance, negative IV test and no paresthesia  Additional Notes 1 attempt Pt. Evaluated and documentation done after procedure finished. Patient identified. Risks/Benefits/Options discussed with patient including but not limited to bleeding, infection, nerve damage, paralysis, failed block, incomplete pain control, headache, blood pressure changes, nausea, vomiting, reactions to medication both or allergic, itching and postpartum back pain. Confirmed with bedside nurse the patient's most recent platelet count. Confirmed with patient that they are not currently taking any anticoagulation, have any bleeding history or any family history of bleeding disorders. Patient expressed understanding and wished to proceed. All questions were answered. Sterile technique was used throughout the entire procedure. Please see nursing notes for vital signs. Test dose was given through epidural catheter and negative prior to continuing to dose epidural or start infusion. Warning signs of  high block given to the patient including shortness of breath, tingling/numbness in hands, complete motor block, or any concerning symptoms with instructions to call for help. Patient was given instructions on fall risk and not to get out of bed. All questions and concerns addressed with instructions to call with any issues or inadequate analgesia.   Patient tolerated the insertion well without immediate complications.Reason for block:procedure for pain

## 2018-04-20 NOTE — Anesthesia Preprocedure Evaluation (Signed)
Anesthesia Evaluation  Patient identified by MRN, date of birth, ID band Patient awake    Reviewed: Allergy & Precautions, H&P , NPO status , Patient's Chart, lab work & pertinent test results  History of Anesthesia Complications Negative for: history of anesthetic complications  Airway Mallampati: III  TM Distance: >3 FB Neck ROM: full    Dental  (+) Chipped   Pulmonary former smoker,           Cardiovascular Exercise Tolerance: Good (-) hypertensionnegative cardio ROS       Neuro/Psych PSYCHIATRIC DISORDERS Anxiety Depression    GI/Hepatic negative GI ROS,   Endo/Other    Renal/GU   negative genitourinary   Musculoskeletal   Abdominal   Peds  Hematology negative hematology ROS (+)   Anesthesia Other Findings Patient reports chronic lumbar pain  Past Medical History: No date: Anxiety No date: Depression  History reviewed. No pertinent surgical history.  BMI    Body Mass Index:  30.26 kg/m      Reproductive/Obstetrics (+) Pregnancy                             Anesthesia Physical Anesthesia Plan  ASA: II  Anesthesia Plan: Epidural   Post-op Pain Management:    Induction:   PONV Risk Score and Plan:   Airway Management Planned:   Additional Equipment:   Intra-op Plan:   Post-operative Plan:   Informed Consent: I have reviewed the patients History and Physical, chart, labs and discussed the procedure including the risks, benefits and alternatives for the proposed anesthesia with the patient or authorized representative who has indicated his/her understanding and acceptance.     Plan Discussed with: Anesthesiologist  Anesthesia Plan Comments:         Anesthesia Quick Evaluation

## 2018-04-20 NOTE — H&P (Signed)
OB History & Physical   History of Present Illness:  Chief Complaint:   HPI:  Cynthia Glover is a 27 y.o. G2P1001 female at [redacted]w[redacted]d dated by Patient's last menstrual period was 07/31/2017. consistent with 7wk [place]. Estimated Date of Delivery: 05/07/18  She presents to L&D with worsening contractions  +FM, + CTX, no LOF, no VB  Pregnancy Issues: 1. Bleeding in mid-trimester, resolved. 2. H/o Anxiety/depression.  Maternal Medical History:   Past Medical History:  Diagnosis Date  . Anxiety   . Depression     History reviewed. No pertinent surgical history.  Allergies  Allergen Reactions  . Penicillins Rash    Prior to Admission medications   Medication Sig Start Date End Date Taking? Authorizing Provider  buPROPion (WELLBUTRIN SR) 150 MG 12 hr tablet Take 1 tablet (150 mg total) by mouth daily. 09/29/16  Yes Mar Daring, PA-C  Prenatal Vit-Fe Fumarate-FA (PRENATAL MULTIVITAMIN) TABS tablet Take 1 tablet by mouth daily at 12 noon.   Yes [provider]  clonazePAM (KLONOPIN) 0.5 MG tablet Take 1 tablet (0.5 mg total) by mouth 3 (three) times daily as needed for anxiety. Patient not taking: Reported on 10/27/2017 09/29/16   Mar Daring, PA-C  doxylamine, Sleep, (UNISOM) 25 MG tablet Take 25 mg by mouth at bedtime as needed.    [provider]  metoCLOPramide (REGLAN) 10 MG tablet Take 1 tablet (10 mg total) by mouth 3 (three) times daily with meals. Patient not taking: Reported on 12/12/2017 11/21/17 11/21/18  Darel Hong, MD  ondansetron (ZOFRAN ODT) 4 MG disintegrating tablet Take 1 tablet (4 mg total) by mouth every 8 (eight) hours as needed for nausea or vomiting. Patient not taking: Reported on 10/27/2017 05/10/17   Letitia Neri L, PA-C  ondansetron (ZOFRAN ODT) 4 MG disintegrating tablet Take 1 tablet (4 mg total) by mouth every 8 (eight) hours as needed for nausea or vomiting. 11/21/17   Darel Hong, MD  sertraline (ZOLOFT) 25 MG tablet  Take 1 tablet (25 mg total) by mouth at bedtime. Patient not taking: Reported on 10/27/2017 09/29/16   Mar Daring, PA-C     Prenatal care site: [place] History: She  reports that she has quit smoking. She has never used smokeless tobacco. She reports that she does not drink alcohol or use drugs.  Family History: family history includes Anxiety disorder in her brother; Depression in her brother and father; Diabetes in her father.   Review of Systems: A full review of systems was performed and negative except as noted in the HPI.     Physical Exam:  Vital Signs: BP 111/70 (BP Location: Right Arm)   Pulse (!) 103   Temp 98.4 F (36.9 C) (Oral)   Resp 18   Wt 90.3 kg (199 lb)   LMP 07/31/2017   BMI 30.26 kg/m  General: no acute distress.  HEENT: normocephalic, atraumatic Heart: regular rate & rhythm.  No murmurs/rubs/gallops Lungs: clear to auscultation bilaterally, normal respiratory effort Abdomen: soft, gravid, non-tender;  EFW: 7.5 Pelvic:   External: Normal external female genitalia  Cervix: Dilation: 4 / Effacement (%): 70 / Station: -2    Extremities: non-tender, symmetric, mild edema bilaterally.  DTRs: 2+  Neurologic: Alert & oriented x 3.    No results found for this or any previous visit (from the past 24 hour(s)).  Pertinent Results:  Prenatal Labs: Blood type/Rh O+  Antibody screen neg  Rubella Immune  Varicella Immune  RPR NR  HBsAg Neg  HIV NR  GC neg  Chlamydia neg  Genetic screening negative  1 hour GTT 106  3 hour GTT   GBS negative   FHT:  130 mod + accels no decels TOCO: q46min SVE:  Dilation: 4 / Effacement (%): 70 / Station: -2    Cephalic by leopolds   Assessment:  Cynthia Glover is a 27 y.o. G2P1001 female at [redacted]w[redacted]d with labor.   Plan:  1. Admit to Labor & Delivery 2. CBC, T&S, Clrs, IVF 3. GBS neg - no ppx indicated 4. Consents obtained. 5. Continuous efm/toco 6. Epidural  If  desired. 7. Anticipate vaginal delivery; expectant management.  ----- Larey Days, MD Attending Obstetrician and Gynecologist [facility], Department of [facility]

## 2018-04-20 NOTE — OB Triage Note (Signed)
Pt cervical check 3/50/-3, pt is going to walk for awhile and then be rechecked to see if there is cervical change.
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
## 2018-04-21 LAB — CBC
HEMATOCRIT: 31.7 % — AB (ref 35.0–47.0)
HEMOGLOBIN: 11 g/dL — AB (ref 12.0–16.0)
MCH: 29 pg (ref 26.0–34.0)
MCHC: 34.8 g/dL (ref 32.0–36.0)
MCV: 83.4 fL (ref 80.0–100.0)
Platelets: 205 10*3/uL (ref 150–440)
RBC: 3.8 MIL/uL (ref 3.80–5.20)
RDW: 14.9 % — ABNORMAL HIGH (ref 11.5–14.5)
WBC: 13.3 10*3/uL — ABNORMAL HIGH (ref 3.6–11.0)

## 2018-04-21 MED ORDER — WITCH HAZEL-GLYCERIN EX PADS: 1.0000 "application " | MEDICATED_PAD | CUTANEOUS | Status: DC

## 2018-04-21 MED ORDER — BENZOCAINE-MENTHOL 20-0.5 % EX AERO: 1.0000 "application " | INHALATION_SPRAY | CUTANEOUS | Status: DC | PRN

## 2018-04-21 MED ORDER — HYDROCORTISONE 2.5 % RE CREA
TOPICAL_CREAM | RECTAL | Status: DC
  Filled 2018-04-21: qty 28.35

## 2018-04-21 MED ORDER — ACETAMINOPHEN 500 MG PO TABS
1000.0000 mg | ORAL_TABLET | Freq: Four times a day (QID) | ORAL | Status: DC | PRN
  Administered 2018-04-21 – 2018-04-22 (×2): 1000 mg via ORAL
  Filled 2018-04-21 (×2): qty 2

## 2018-04-21 MED ORDER — ONDANSETRON HCL 4 MG PO TABS: 4.0000 mg | ORAL_TABLET | ORAL | Status: DC | PRN

## 2018-04-21 MED ORDER — PRENATAL MULTIVITAMIN CH
1.0000 | ORAL_TABLET | Freq: Every day | ORAL | Status: DC
  Administered 2018-04-21 – 2018-04-22 (×2): 1 via ORAL
  Filled 2018-04-21 (×3): qty 1

## 2018-04-21 MED ORDER — ONDANSETRON HCL 4 MG/2ML IJ SOLN: 4.0000 mg | INTRAMUSCULAR | Status: DC | PRN

## 2018-04-21 MED ORDER — SIMETHICONE 80 MG PO CHEW: 80.0000 mg | CHEWABLE_TABLET | ORAL | Status: DC | PRN

## 2018-04-21 MED ORDER — DIPHENHYDRAMINE HCL 25 MG PO CAPS: 25.0000 mg | ORAL_CAPSULE | Freq: Four times a day (QID) | ORAL | Status: DC | PRN

## 2018-04-21 MED ORDER — IBUPROFEN 600 MG PO TABS
600.0000 mg | ORAL_TABLET | Freq: Four times a day (QID) | ORAL | Status: DC
  Administered 2018-04-21 – 2018-04-22 (×6): 600 mg via ORAL
  Filled 2018-04-21 (×6): qty 1

## 2018-04-21 MED ORDER — COCONUT OIL OIL: 1.0000 "application " | TOPICAL_OIL | Status: DC | PRN

## 2018-04-21 MED ORDER — DOCUSATE SODIUM 100 MG PO CAPS
100.0000 mg | ORAL_CAPSULE | Freq: Two times a day (BID) | ORAL | Status: DC
  Administered 2018-04-21 – 2018-04-22 (×2): 100 mg via ORAL
  Filled 2018-04-21 (×2): qty 1

## 2018-04-21 NOTE — Progress Notes (Signed)
Post Partum Day 0 Social Visit  Subjective: Doing well, no concerns. Ambulating without difficulty, pain managed with PO meds, tolerating regular diet, and voiding without difficulty.   Objective: BP 119/66   Pulse (!) 108   Temp 99.1 F (37.3 C) (Oral)   Resp 16   Ht 5\' 8"  (1.727 m)   Wt 90.3 kg (199 lb)   LMP 07/31/2017   SpO2 99%   Breastfeeding? Unknown   BMI 30.26 kg/m    Physical Exam:  General: alert, cooperative, appears stated age and no distress Pulm: normal respiratory effort  Recent Labs    04/20/18 1804  HGB 11.9*  HCT 33.5*  WBC 9.7  PLT 241    Assessment/Plan: 27 y.o. G2P2002 postpartum day # 0  -Continue routine PP care -Check CBC in the morning -Immunization status: up to date  Disposition: Continue inpatient postpartum care   LOS: 1 day   Lisette Grinder, CNM 04/21/2018, 8:42 AM   ----- Lisette Grinder Certified Nurse Midwife [facility]

## 2018-04-21 NOTE — Discharge Summary (Signed)
Obstetrical Discharge Summary  Patient Name: Cynthia Glover DOB: September 29, 1991 MRN: [id number]  Date of Admission: 04/20/2018 Date of Delivery: 04/21/18 Delivered by: Larey Days, MD Date of Discharge: 04/22/2018  Primary OB: [facility]  GGY:IRSWNIO'E last menstrual period was 07/31/2017. EDC Estimated Date of Delivery: 05/07/18 Gestational Age at Delivery: [redacted]w[redacted]d   Antepartum complications:   1. Bleeding mid-trimester, resolved 2. H/o anxiety,depression 3. Obesity, BMI 32  Admitting Diagnosis: labor Secondary Diagnosis: Patient Active Problem List   Diagnosis Date Noted  . Labor and delivery indication for care or intervention 04/20/2018  . 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 Obesity 03/31/2016  . BMI 32.0-32.9,adult 03/31/2016    Augmentation: AROM and Pitocin Complications: None Intrapartum complications/course: Mom presented to L&D with labor, augmented with pitocin.  epidual placed. Progressed to complete, second stage: 2 pushes with delivery of fetal head with restitution to LOA.   Anterior then posterior shoulders delivered without difficulty. Nuchal cord reduced after delivery of body.  Baby placed on mom's chest, and attended to by peds.  We sang happy birthday to baby Vonna Kotyk.  Cord was then clamped and cut when pulseless.  Placenta spontaneously delivered, intact.   IV pitocin given for hemorrhage prophylaxis. Date of Delivery: 04/21/18 Delivered By: Vikki Ports Ward Delivery Type: spontaneous vaginal delivery Anesthesia: epidural Placenta: sponatneous Laceration: none Episiotomy: none Newborn Data: Live born female  Birth Weight:  3110g 6lb 14oz APGAR: 8, 9   Newborn Delivery   Birth date/time:  04/21/2018 04:08:00 Delivery type:  Vaginal, Spontaneous     Postpartum Procedures: none  Post partum course:  Patient had an uncomplicated postpartum course.  By time of discharge on PPD#2, her pain was controlled on oral pain medications;  she had appropriate lochia and was ambulating, voiding without difficulty and tolerating regular diet.  She was deemed stable for discharge to home.    Discharge Physical Exam:  BP 106/63 (BP Location: Right Arm)   Pulse 79   Temp 98 F (36.7 C) (Oral)   Resp 18   Ht 5\' 8"  (1.727 m)   Wt 90.3 kg (199 lb)   LMP 07/31/2017   SpO2 96%   Breastfeeding? Unknown   BMI 30.26 kg/m   General: NAD CV: RRR Pulm: CTABL, nl effort ABD: s/nd/nt, fundus firm and below the umbilicus Lochia: moderate DVT Evaluation: LE non-ttp, no evidence of DVT on exam.  Hemoglobin  Date Value Ref Range Status  04/22/2018 10.9 (L) 12.0 - 16.0 g/dL Final  08/02/2016 12.4 11.1 - 15.9 g/dL Final   HCT  Date Value Ref Range Status  04/22/2018 32.2 (L) 35.0 - 47.0 % Final   Hematocrit  Date Value Ref Range Status  08/02/2016 38.2 34.0 - 46.6 % Final   Disposition: stable, discharge to home. Baby Feeding: breastmilk Baby Disposition: home with mom  Rh Immune globulin given: n/a Rubella vaccine given: n/a Tdap vaccine given in AP or PP setting: AP Flu vaccine given in AP or PP setting: n/a  Contraception: TBD @ pp visit  Prenatal Labs:  Blood type/Rh O+  Antibody screen neg  Rubella Immune  Varicella Immune  RPR NR  HBsAg Neg  HIV NR  GC neg  Chlamydia neg  Genetic screening negative  1 hour GTT 106  3 hour GTT n/a  GBS negative      Plan:  HANSINI CLODFELTER was discharged to home in good condition. Follow-up appointment with Dr Leonides Schanz in 2 weeks.  Discharge Medications: Allergies as of  04/22/2018      Reactions   Penicillins Rash      Medication List    TAKE these medications   acetaminophen 500 MG tablet Commonly known as:  TYLENOL Take 2 tablets (1,000 mg total) by mouth every 6 (six) hours as needed (for pain scale < 4).   buPROPion 150 MG 12 hr tablet Commonly known as:  WELLBUTRIN SR Take 1 tablet (150 mg total) by mouth daily.   clonazePAM 0.5 MG tablet Commonly known  as:  KLONOPIN Take 1 tablet (0.5 mg total) by mouth 3 (three) times daily as needed for anxiety.   coconut oil Oil Apply 1 application topically as needed.   docusate sodium 100 MG capsule Commonly known as:  COLACE Take 1 capsule (100 mg total) by mouth 2 (two) times daily.   doxylamine (Sleep) 25 MG tablet Commonly known as:  UNISOM Take 25 mg by mouth at bedtime as needed.   ibuprofen 600 MG tablet Commonly known as:  ADVIL,MOTRIN Take 1 tablet (600 mg total) by mouth every 6 (six) hours.   metoCLOPramide 10 MG tablet Commonly known as:  REGLAN Take 1 tablet (10 mg total) by mouth 3 (three) times daily with meals.   ondansetron 4 MG disintegrating tablet Commonly known as:  ZOFRAN ODT Take 1 tablet (4 mg total) by mouth every 8 (eight) hours as needed for nausea or vomiting.   ondansetron 4 MG disintegrating tablet Commonly known as:  ZOFRAN ODT Take 1 tablet (4 mg total) by mouth every 8 (eight) hours as needed for nausea or vomiting.   prenatal multivitamin Tabs tablet Take 1 tablet by mouth daily at 12 noon.   sertraline 25 MG tablet Commonly known as:  ZOLOFT Take 1 tablet (25 mg total) by mouth at bedtime.       Follow-up Information    Ward, Honor Loh, MD. Schedule an appointment as soon as possible for a visit in 2 week(s).   Specialty:  Obstetrics and Gynecology Contact information: [address] [place] [address] [phone]           Signed: Benjaman Kindler 04/22/18

## 2018-04-21 NOTE — Lactation Note (Signed)
This note was copied from a baby's chart. Lactation Consultation Note  Patient Name: Cynthia Glover WIOMB'T Date: 04/21/2018 Reason for consult: Mother's request   Maternal Data    Feeding Feeding Type: Breast Milk Length of feed: 0 min  LATCH Score Latch: Too sleepy or reluctant, no latch achieved, no sucking elicited.  Audible Swallowing: None  Type of Nipple: Everted at rest and after stimulation  Comfort (Breast/Nipple): Soft / non-tender  Hold (Positioning): Full assist, staff holds infant at breast  LATCH Score: 4  Interventions    Lactation Tools Discussed/Used Tools: Nipple Shields Nipple shield size: 20   Consult Status Consult Status: Follow-up Date: 04/21/18 Follow-up type: In-patient  Baby is very spitty and may have swallowed amniotic fluid during delivery. Parents have been encouraged to keep trying to nurse baby and if baby doesn't eat in the next 2hrs, mom should hand express or pump to feed infant.   Marnee Spring 04/21/2018, 3:48 PM

## 2018-04-21 NOTE — Plan of Care (Signed)
Patient up to bathroom independently, verbalizes needs and is coping with postpartum period effectively

## 2018-04-21 NOTE — Progress Notes (Signed)
Intrapartum progress note:  S: still coughing but better... Tired.  Comfortable after epidural.  O: BP 111/66 (BP Location: Left Arm)   Pulse 85   Temp 98.5 F (36.9 C) (Oral)   Resp 16   Ht 5\' 8"  (1.727 m)   Wt 90.3 kg (199 lb)   LMP 07/31/2017   SpO2 98%   BMI 30.26 kg/m   FHT:  145 mod + accels no decels TOCO: q24min, pit @ 10 SVE: 7/100/0, AROM'd for pink tinged fluid, moderate amount  A/P: 26yo G2P1 @ 37+5 with labor  1. IUP: category 1, +accels with incidental scalp stim during AROM 2. Labor: began augmentation soon after admission; progressing well, continue after AROM. 3. URI: s/p flonase inhaled steroid and robitussin for cough.  Continue PRN. 4. Continue active management, anticipate vaginal delivery.  ----- Larey Days, MD Attending Obstetrician and Gynecologist [facility], Department of [facility]

## 2018-04-22 LAB — CBC
HCT: 32.2 % — ABNORMAL LOW (ref 35.0–47.0)
Hemoglobin: 10.9 g/dL — ABNORMAL LOW (ref 12.0–16.0)
MCH: 28.6 pg (ref 26.0–34.0)
MCHC: 33.9 g/dL (ref 32.0–36.0)
MCV: 84.1 fL (ref 80.0–100.0)
PLATELETS: 219 10*3/uL (ref 150–440)
RBC: 3.82 MIL/uL (ref 3.80–5.20)
RDW: 15 % — AB (ref 11.5–14.5)
WBC: 8.7 10*3/uL (ref 3.6–11.0)

## 2018-04-22 LAB — RPR: RPR: NONREACTIVE

## 2018-04-22 MED ORDER — IBUPROFEN 600 MG PO TABS: 600.0000 mg | ORAL_TABLET | Freq: Four times a day (QID) | ORAL | 0 refills | Status: DC

## 2018-04-22 MED ORDER — ACETAMINOPHEN 500 MG PO TABS: 1000.0000 mg | ORAL_TABLET | Freq: Four times a day (QID) | ORAL | 0 refills | Status: DC | PRN

## 2018-04-22 MED ORDER — DOCUSATE SODIUM 100 MG PO CAPS: 100.0000 mg | ORAL_CAPSULE | Freq: Two times a day (BID) | ORAL | 0 refills | Status: DC

## 2018-04-22 MED ORDER — COCONUT OIL OIL: 1.0000 "application " | TOPICAL_OIL | 0 refills | Status: DC | PRN

## 2018-04-22 NOTE — Progress Notes (Signed)
Discharge instructions given. Patient verbalizes understanding of teaching. Patient discharged home via wheelchair at 1315.

## 2018-04-22 NOTE — Anesthesia Postprocedure Evaluation (Signed)
Anesthesia Post Note  Patient: Cynthia Glover  Procedure(s) Performed: AN AD HOC LABOR EPIDURAL  Patient location during evaluation: Mother Baby Anesthesia Type: Epidural Level of consciousness: awake and alert Pain management: pain level controlled Vital Signs Assessment: post-procedure vital signs reviewed and stable Respiratory status: spontaneous breathing, nonlabored ventilation and respiratory function stable Cardiovascular status: stable Postop Assessment: no headache, no backache and epidural receding Anesthetic complications: no     Last Vitals:  Vitals:   04/22/18 0057 04/22/18 0743  BP: (!) 101/54 106/63  Pulse: 64 79  Resp: 18 18  Temp: 36.5 C 36.7 C  SpO2: 97% 96%    Last Pain:  Vitals:   04/22/18 1000  TempSrc:   PainSc: 5                  KEPHART,WILLIAM K

## 2018-10-23 ENCOUNTER — Ambulatory Visit: Payer: BC Managed Care – PPO | Admitting: Physician Assistant

## 2018-10-23 ENCOUNTER — Encounter: Payer: Self-pay | Admitting: Physician Assistant

## 2018-10-23 VITALS — BP 117/82 | HR 89 | Temp 98.2°F | Resp 16 | Ht 68.0 in | Wt 227.4 lb

## 2018-10-23 DIAGNOSIS — R5383 Other fatigue: Secondary | ICD-10-CM

## 2018-10-23 DIAGNOSIS — Z0000 Encounter for general adult medical examination without abnormal findings: Secondary | ICD-10-CM

## 2018-10-23 DIAGNOSIS — F4323 Adjustment disorder with mixed anxiety and depressed mood: Secondary | ICD-10-CM

## 2018-10-23 DIAGNOSIS — E6609 Other obesity due to excess calories: Secondary | ICD-10-CM | POA: Diagnosis not present

## 2018-10-23 DIAGNOSIS — Z6834 Body mass index (BMI) 34.0-34.9, adult: Secondary | ICD-10-CM

## 2018-10-23 DIAGNOSIS — Z833 Family history of diabetes mellitus: Secondary | ICD-10-CM

## 2018-10-23 MED ORDER — BUPROPION HCL ER (SR) 150 MG PO TB12: 150.0000 mg | ORAL_TABLET | Freq: Two times a day (BID) | ORAL | 1 refills | Status: DC

## 2018-10-23 MED ORDER — PHENTERMINE HCL 37.5 MG PO TABS: 37.5000 mg | ORAL_TABLET | Freq: Every day | ORAL | 2 refills | Status: DC

## 2018-10-23 MED ORDER — CLONAZEPAM 0.5 MG PO TABS: 0.5000 mg | ORAL_TABLET | Freq: Two times a day (BID) | ORAL | 1 refills | Status: DC | PRN

## 2018-10-23 NOTE — Progress Notes (Signed)
Patient: Cynthia Glover, Female    DOB: Dec 11, 1990, 27 y.o.   MRN: [id number] Visit Date: 10/23/2018  Today's Provider: Mar Daring, PA-C   Chief Complaint  Patient presents with  . Annual Exam   Subjective:    Annual physical exam [name] is a 27 y.o. female who presents today for health maintenance and complete physical. She feels well. She reports exercising no. She reports she is sleeping well.  She does report increased fatigue, weight gain and depression/excessive worry. She reports that her father has since passed as well since she was last seen. Her brother had passed in a MVA then her father passed 6 months later. He fell and went into a diabetic coma.   She has since became pregnant and had her son 5 months ago. She was followed by Dr. Leonides Schanz at [place]. Reports doing well.  -----------------------------------------------------------------   Review of Systems  Constitutional: Positive for unexpected weight change.  HENT: Positive for congestion and sinus pressure.   Eyes: Negative.   Respiratory: Negative.   Cardiovascular: Negative.   Gastrointestinal: Negative.   Endocrine: Positive for polyphagia.  Genitourinary: Negative.   Musculoskeletal: Positive for back pain.  Skin: Negative.   Allergic/Immunologic: Negative.   Neurological: Positive for headaches.  Hematological: Negative.   Psychiatric/Behavioral: Positive for decreased concentration and dysphoric mood.    Social History      She  reports that she has quit smoking. She has never used smokeless tobacco. She reports that she does not drink alcohol or use drugs.       Social History   Socioeconomic History  . Marital status: Married    Spouse name: Not on file  . Number of children: Not on file  . Years of education: Not on file  . Highest education level: Not on file  Occupational History  . Not on file  Social Needs  . Financial resource strain: Not on file  . Food  insecurity:    Worry: Not on file    Inability: Not on file  . Transportation needs:    Medical: Not on file    Non-medical: Not on file  Tobacco Use  . Smoking status: Former Research scientist (life sciences)  . Smokeless tobacco: Never Used  . Tobacco comment: Quit in 2015. Smoked for 5 years.  Substance and Sexual Activity  . Alcohol use: No  . Drug use: No  . Sexual activity: Yes  Lifestyle  . Physical activity:    Days per week: Not on file    Minutes per session: Not on file  . Stress: Not on file  Relationships  . Social connections:    Talks on phone: Not on file    Gets together: Not on file    Attends religious service: Not on file    Active member of club or organization: Not on file    Attends meetings of clubs or organizations: Not on file    Relationship status: Not on file  Other Topics Concern  . Not on file  Social History Narrative  . Not on file    Past Medical History:  Diagnosis Date  . Anxiety   . Depression      Patient Active Problem List   Diagnosis Date Noted  . Labor and delivery indication for care or intervention 04/20/2018  . 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 Obesity 03/31/2016  . BMI 32.0-32.9,adult 03/31/2016    No past surgical history  on file.  Family History        Family Status  Relation Name Status  . Mother  Alive  . Father  Alive  . Brother  Deceased at age 39       died 06-02-2023 from a car accident.  . Sister  Alive        Her family history includes Anxiety disorder in her brother; Depression in her brother and father; Diabetes in her father.      Allergies  Allergen Reactions  . Penicillins Rash     Current Outpatient Medications:  .  acetaminophen (TYLENOL) 500 MG tablet, Take 2 tablets (1,000 mg total) by mouth every 6 (six) hours as needed (for pain scale < 4)., Disp: 30 tablet, Rfl: 0   Patient Care Team: Mar Daring, PA-C as PCP - General (Family Medicine)      Objective:   Vitals:  BP 117/82 (BP Location: Left Arm, Patient Position: Sitting, Cuff Size: Large)   Pulse 89   Temp 98.2 F (36.8 C) (Oral)   Resp 16   Ht 5\' 8"  (1.727 m)   Wt 227 lb 6.4 oz (103.1 kg)   SpO2 97%   Breastfeeding? No   BMI 34.58 kg/m    Vitals:   10/23/18 1415  BP: 117/82  Pulse: 89  Resp: 16  Temp: 98.2 F (36.8 C)  TempSrc: Oral  SpO2: 97%  Weight: 227 lb 6.4 oz (103.1 kg)  Height: 5\' 8"  (1.727 m)     Physical Exam  Constitutional: She is oriented to person, place, and time. She appears well-developed and well-nourished. No distress.  HENT:  Head: Normocephalic and atraumatic.  Right Ear: Hearing, tympanic membrane, external ear and ear canal normal.  Left Ear: Hearing, tympanic membrane, external ear and ear canal normal.  Nose: Nose normal.  Mouth/Throat: Uvula is midline, oropharynx is clear and moist and mucous membranes are normal. No oropharyngeal exudate.  Eyes: Pupils are equal, round, and reactive to light. Conjunctivae and EOM are normal. Right eye exhibits no discharge. Left eye exhibits no discharge. No scleral icterus.  Neck: Normal range of motion. Neck supple. No JVD present. No tracheal deviation present. No thyromegaly present.  Cardiovascular: Normal rate, regular rhythm, normal heart sounds and intact distal pulses. Exam reveals no gallop and no friction rub.  No murmur heard. Pulmonary/Chest: Effort normal and breath sounds normal. No respiratory distress. She has no wheezes. She has no rales. She exhibits no tenderness.  Abdominal: Soft. Bowel sounds are normal. She exhibits no distension and no mass. There is no tenderness. There is no rebound and no guarding.  Genitourinary:  Genitourinary Comments: Deferred to GYN  Musculoskeletal: Normal range of motion. She exhibits no edema or tenderness.  Lymphadenopathy:    She has no cervical adenopathy.  Neurological: She is alert and oriented to person, place, and time.  Skin: Skin is warm and dry. No rash  noted. She is not diaphoretic.  Psychiatric: She has a normal mood and affect. Her behavior is normal. Judgment and thought content normal.  Vitals reviewed.    Depression Screen PHQ 2/9 Scores 10/23/2018 10/29/2015  PHQ - 2 Score 5 2  PHQ- 9 Score 22 15     Assessment & Plan:     Routine Health Maintenance and Physical Exam  Exercise Activities and Dietary recommendations Goals   None      There is no immunization history on file for this patient.  Health Maintenance  Topic Date  Due  . TETANUS/TDAP  09/17/2008  . PAP SMEAR  09/17/2010  . INFLUENZA VACCINE  06/15/2018  . HIV Screening  Completed     Discussed health benefits of physical activity, and encouraged her to engage in regular exercise appropriate for her age and condition.    1. Annual physical exam Normal physical exam today. Will check labs as below and f/u pending lab results. If labs are stable and WNL she will not need to have these rechecked for one year at her next annual physical exam. She is to call the office in the meantime if she has any acute issue, questions or concerns. - CBC w/Diff/Platelet - Comprehensive Metabolic Panel (CMET) - TSH - Lipid Profile - HgB A1c - Vitamin D (25 hydroxy) - B12  2. Class 1 obesity due to excess calories with serious comorbidity and body mass index (BMI) of 34.0 to 34.9 in adult Counseled patient on healthy lifestyle modifications including dieting and exercise.  Will start phentermine as well to kick start. Discussed using food diary and limiting to 1200 calories daily. I will see her back in 6 weeks for weight check.  - phentermine (ADIPEX-P) 37.5 MG tablet; Take 1 tablet (37.5 mg total) by mouth daily before breakfast.  Dispense: 30 tablet; Refill: 2 - Comprehensive Metabolic Panel (CMET) - Lipid Profile - HgB A1c  3. Situational mixed anxiety and depressive disorder Restarting wellbutrin and clonazepam as below. I will see her back in 6 weeks for follow  up.  - buPROPion (WELLBUTRIN SR) 150 MG 12 hr tablet; Take 1 tablet (150 mg total) by mouth 2 (two) times daily.  Dispense: 180 tablet; Refill: 1 - clonazePAM (KLONOPIN) 0.5 MG tablet; Take 1 tablet (0.5 mg total) by mouth 2 (two) times daily as needed for anxiety.  Dispense: 60 tablet; Refill: 1 - TSH  4. Fatigue, unspecified type Will check labs as below and f/u pending results. - TSH - Vitamin D (25 hydroxy) - B12  5. Family history of diabetes mellitus in father Father passed from diabetic coma after a fall. Will check labs as below and f/u pending results. - HgB A1c  --------------------------------------------------------------------    Mar Daring, PA-C  [facility]

## 2018-10-23 NOTE — Patient Instructions (Signed)

## 2018-10-24 LAB — COMPREHENSIVE METABOLIC PANEL
A/G RATIO: 1.8 (ref 1.2–2.2)
ALT: 19 IU/L (ref 0–32)
AST: 14 IU/L (ref 0–40)
Albumin: 4.2 g/dL (ref 3.5–5.5)
Alkaline Phosphatase: 72 IU/L (ref 39–117)
BUN/Creatinine Ratio: 15 (ref 9–23)
BUN: 10 mg/dL (ref 6–20)
Bilirubin Total: 0.2 mg/dL (ref 0.0–1.2)
CALCIUM: 9.6 mg/dL (ref 8.7–10.2)
CHLORIDE: 102 mmol/L (ref 96–106)
CO2: 21 mmol/L (ref 20–29)
Creatinine, Ser: 0.65 mg/dL (ref 0.57–1.00)
GFR calc Af Amer: 139 mL/min/{1.73_m2} (ref >59)
GFR calc non Af Amer: 120 mL/min/{1.73_m2} (ref >59)
Globulin, Total: 2.4 g/dL (ref 1.5–4.5)
Glucose: 86 mg/dL (ref 65–99)
POTASSIUM: 4 mmol/L (ref 3.5–5.2)
Sodium: 138 mmol/L (ref 134–144)
Total Protein: 6.6 g/dL (ref 6.0–8.5)

## 2018-10-24 LAB — CBC WITH DIFFERENTIAL/PLATELET
Basophils Absolute: 0.1 10*3/uL (ref 0.0–0.2)
Basos: 1 %
EOS (ABSOLUTE): 0.3 10*3/uL (ref 0.0–0.4)
Eos: 3 %
Hematocrit: 38.9 % (ref 34.0–46.6)
Hemoglobin: 12.9 g/dL (ref 11.1–15.9)
IMMATURE GRANULOCYTES: 1 %
Immature Grans (Abs): 0 10*3/uL (ref 0.0–0.1)
Lymphocytes Absolute: 2.1 10*3/uL (ref 0.7–3.1)
Lymphs: 29 %
MCH: 28.1 pg (ref 26.6–33.0)
MCHC: 33.2 g/dL (ref 31.5–35.7)
MCV: 85 fL (ref 79–97)
Monocytes Absolute: 0.5 10*3/uL (ref 0.1–0.9)
Monocytes: 6 %
NEUTROS PCT: 60 %
Neutrophils Absolute: 4.4 10*3/uL (ref 1.4–7.0)
PLATELETS: 260 10*3/uL (ref 150–450)
RBC: 4.59 x10E6/uL (ref 3.77–5.28)
RDW: 13.3 % (ref 12.3–15.4)
WBC: 7.4 10*3/uL (ref 3.4–10.8)

## 2018-10-24 LAB — LIPID PANEL
CHOLESTEROL TOTAL: 112 mg/dL (ref 100–199)
Chol/HDL Ratio: 2.4 ratio (ref 0.0–4.4)
HDL: 46 mg/dL (ref >39)
LDL CALC: 41 mg/dL (ref 0–99)
Triglycerides: 123 mg/dL (ref 0–149)
VLDL Cholesterol Cal: 25 mg/dL (ref 5–40)

## 2018-10-24 LAB — TSH: TSH: 1.85 u[IU]/mL (ref 0.450–4.500)

## 2018-10-24 LAB — HEMOGLOBIN A1C
Est. average glucose Bld gHb Est-mCnc: 97 mg/dL
Hgb A1c MFr Bld: 5 % (ref 4.8–5.6)

## 2018-10-24 LAB — VITAMIN D 25 HYDROXY (VIT D DEFICIENCY, FRACTURES): VIT D 25 HYDROXY: 23.5 ng/mL — AB (ref 30.0–100.0)

## 2018-10-24 LAB — VITAMIN B12: VITAMIN B 12: 639 pg/mL (ref 232–1245)

## 2018-10-25 ENCOUNTER — Telehealth: Payer: Self-pay

## 2018-10-25 NOTE — Telephone Encounter (Signed)
Patient advised as below. Patient verbalizes understanding and is in agreement with treatment plan.  

## 2018-10-25 NOTE — Telephone Encounter (Signed)
-----   Message from Mar Daring, [place] sent at 10/25/2018  9:50 AM EST ----- Blood count normal. Kidney and liver function normal. Thyroid normal. Cholesterol normal. Sugar normal. Vit D is low. Recommend OTC Vit D of 1000-2000 IU daily. B12 normal.

## 2018-11-13 ENCOUNTER — Telehealth: Payer: Self-pay | Admitting: Family Medicine

## 2018-11-13 NOTE — Telephone Encounter (Signed)
Patient of Maude Leriche. Patient called in stating her back is hurting "really really bad".  She said the pain has intensified over the past 2 weeks and wants to be referred to an orthopedic doctor.   Please advise.

## 2018-11-13 NOTE — Telephone Encounter (Signed)
Advised patient as below. Appt was scheduled on 11/21/18 with Tawanna Sat.

## 2018-11-13 NOTE — Telephone Encounter (Signed)
Patients need to be seen for OV to determine if referral is necessary and potentially start treatment

## 2018-11-13 NOTE — Telephone Encounter (Signed)
[facility] 

## 2018-11-21 ENCOUNTER — Ambulatory Visit: Payer: Self-pay | Admitting: Physician Assistant

## 2018-11-22 ENCOUNTER — Encounter: Payer: Self-pay | Admitting: Physician Assistant

## 2018-11-22 ENCOUNTER — Ambulatory Visit: Payer: BC Managed Care – PPO | Admitting: Physician Assistant

## 2018-11-22 VITALS — BP 119/79 | HR 96 | Temp 98.7°F | Resp 16 | Wt 224.0 lb

## 2018-11-22 DIAGNOSIS — M545 Low back pain, unspecified: Secondary | ICD-10-CM

## 2018-11-22 DIAGNOSIS — R41840 Attention and concentration deficit: Secondary | ICD-10-CM | POA: Diagnosis not present

## 2018-11-22 DIAGNOSIS — F411 Generalized anxiety disorder: Secondary | ICD-10-CM | POA: Diagnosis not present

## 2018-11-22 DIAGNOSIS — F320 Major depressive disorder, single episode, mild: Secondary | ICD-10-CM

## 2018-11-22 DIAGNOSIS — M542 Cervicalgia: Secondary | ICD-10-CM

## 2018-11-22 DIAGNOSIS — G8929 Other chronic pain: Secondary | ICD-10-CM

## 2018-11-22 MED ORDER — BACLOFEN 10 MG PO TABS: 10.0000 mg | ORAL_TABLET | Freq: Three times a day (TID) | ORAL | 0 refills | Status: DC

## 2018-11-22 MED ORDER — MELOXICAM 15 MG PO TABS: 15.0000 mg | ORAL_TABLET | Freq: Every day | ORAL | 0 refills | Status: DC

## 2018-11-22 MED ORDER — VENLAFAXINE HCL ER 75 MG PO CP24
75.0000 mg | ORAL_CAPSULE | Freq: Every day | ORAL | 1 refills | Status: DC
Start: 2018-11-22 — End: 2019-07-19

## 2018-11-22 NOTE — Patient Instructions (Signed)
Venlafaxine extended-release capsules  What is this medicine?  VENLAFAXINE(VEN la fax een) is used to treat depression, anxiety and panic disorder.  This medicine may be used for other purposes; ask your health care provider or pharmacist if you have questions.  COMMON BRAND NAME(S): Effexor XR  What should I tell my health care provider before I take this medicine?  They need to know if you have any of these conditions:  -bleeding disorders  -glaucoma  -heart disease  -high blood pressure  -high cholesterol  -kidney disease  -liver disease  -low levels of sodium in the blood  -mania or bipolar disorder  -seizures  -suicidal thoughts, plans, or attempt; a previous suicide attempt by you or a family  -take medicines that treat or prevent blood clots  -thyroid disease  -an unusual or allergic reaction to venlafaxine, desvenlafaxine, other medicines, foods, dyes, or preservatives  -pregnant or trying to get pregnant  -breast-feeding  How should I use this medicine?  Take this medicine by mouth with a full glass of water. Follow the directions on the prescription label. Do not cut, crush, or chew this medicine. Take it with food. If needed, the capsule may be carefully opened and the entire contents sprinkled on a spoonful of cool applesauce. Swallow the applesauce/pellet mixture right away without chewing and follow with a glass of water to ensure complete swallowing of the pellets. Try to take your medicine at about the same time each day. Do not take your medicine more often than directed. Do not stop taking this medicine suddenly except upon the advice of your doctor. Stopping this medicine too quickly may cause serious side effects or your condition may worsen.  A special MedGuide will be given to you by the pharmacist with each prescription and refill. Be sure to read this information carefully each time.  Talk to your pediatrician regarding the use of this medicine in children. Special care may be  needed.  Overdosage: If you think you have taken too much of this medicine contact a poison control center or emergency room at once.  NOTE: This medicine is only for you. Do not share this medicine with others.  What if I miss a dose?  If you miss a dose, take it as soon as you can. If it is almost time for your next dose, take only that dose. Do not take double or extra doses.  What may interact with this medicine?  Do not take this medicine with any of the following medications:  -certain medicines for fungal infections like fluconazole, itraconazole, ketoconazole, posaconazole, voriconazole  -cisapride  -desvenlafaxine  -dofetilide  -dronedarone  -duloxetine  -levomilnacipran  -linezolid  -MAOIs like Carbex, Eldepryl, Marplan, Nardil, and Parnate  -methylene blue (injected into a vein)  -milnacipran  -pimozide  -thioridazine  -ziprasidone  This medicine may also interact with the following medications:  -amphetamines  -aspirin and aspirin-like medicines  -certain medicines for depression, anxiety, or psychotic disturbances  -certain medicines for migraine headaches like almotriptan, eletriptan, frovatriptan, naratriptan, rizatriptan, sumatriptan, zolmitriptan  -certain medicines for sleep  -certain medicines that treat or prevent blood clots like dalteparin, enoxaparin, warfarin  -cimetidine  -clozapine  -diuretics  -fentanyl  -furazolidone  -indinavir  -isoniazid  -lithium  -metoprolol  -NSAIDS, medicines for pain and inflammation, like ibuprofen or naproxen  -other medicines that prolong the QT interval (cause an abnormal heart rhythm)  -procarbazine  -rasagiline  -supplements like St. John's wort, kava kava, valerian  -tramadol  -tryptophan    This list may not describe all possible interactions. Give your health care provider a list of all the medicines, herbs, non-prescription drugs, or dietary supplements you use. Also tell them if you smoke, drink alcohol, or use illegal drugs. Some items may interact with  your medicine.  What should I watch for while using this medicine?  Tell your doctor if your symptoms do not get better or if they get worse. Visit your doctor or health care professional for regular checks on your progress. Because it may take several weeks to see the full effects of this medicine, it is important to continue your treatment as prescribed by your doctor.  Patients and their families should watch out for new or worsening thoughts of suicide or depression. Also watch out for sudden changes in feelings such as feeling anxious, agitated, panicky, irritable, hostile, aggressive, impulsive, severely restless, overly excited and hyperactive, or not being able to sleep. If this happens, especially at the beginning of treatment or after a change in dose, call your health care professional.  This medicine can cause an increase in blood pressure. Check with your doctor for instructions on monitoring your blood pressure while taking this medicine.  You may get drowsy or dizzy. Do not drive, use machinery, or do anything that needs mental alertness until you know how this medicine affects you. Do not stand or sit up quickly, especially if you are an older patient. This reduces the risk of dizzy or fainting spells. Alcohol may interfere with the effect of this medicine. Avoid alcoholic drinks.  Your mouth may get dry. Chewing sugarless gum, sucking hard candy and drinking plenty of water will help. Contact your doctor if the problem does not go away or is severe.  What side effects may I notice from receiving this medicine?  Side effects that you should report to your doctor or health care professional as soon as possible:  -allergic reactions like skin rash, itching or hives, swelling of the face, lips, or tongue  -anxious  -breathing problems  -confusion  -changes in vision  -chest pain  -confusion  -elevated mood, decreased need for sleep, racing thoughts, impulsive behavior  -eye pain  -fast, irregular  heartbeat  -feeling faint or lightheaded, falls  -feeling agitated, angry, or irritable  -hallucination, loss of contact with reality  -high blood pressure  -loss of balance or coordination  -palpitations  -redness, blistering, peeling or loosening of the skin, including inside the mouth  -restlessness, pacing, inability to keep still  -seizures  -stiff muscles  -suicidal thoughts or other mood changes  -trouble passing urine or change in the amount of urine  -trouble sleeping  -unusual bleeding or bruising  -unusually weak or tired  -vomiting  Side effects that usually do not require medical attention (report to your doctor or health care professional if they continue or are bothersome):  -change in sex drive or performance  -change in appetite or weight  -constipation  -dizziness  -dry mouth  -headache  -increased sweating  -nausea  -tired  This list may not describe all possible side effects. Call your doctor for medical advice about side effects. You may report side effects to FDA at 1-800-FDA-1088.  Where should I keep my medicine?  Keep out of the reach of children.  Store at a controlled temperature between 20 and 25 degrees C (68 degrees and 77 degrees F), in a dry place. Throw away any unused medicine after the expiration date.  NOTE: This sheet is a

## 2018-11-22 NOTE — Progress Notes (Signed)
Patient: Cynthia Glover Female    DOB: [birth year]   28 y.o.   MRN: [id number] Visit Date: 11/22/2018  Today's Provider: Mar Daring, PA-C   Chief Complaint  Patient presents with  . Back Pain   Subjective:     HPI   Patient states she has mid-back pain for years. Patient pain radiates to upper back, up her spine. Patient states back pain has worsened lately. She denies any injury.   Patient also wants to discuss her medication bupropion and clonazepam.    Allergies  Allergen Reactions  . Penicillins Rash     Current Outpatient Medications:  .  buPROPion (WELLBUTRIN SR) 150 MG 12 hr tablet, Take 1 tablet (150 mg total) by mouth 2 (two) times daily., Disp: 180 tablet, Rfl: 1 .  clonazePAM (KLONOPIN) 0.5 MG tablet, Take 1 tablet (0.5 mg total) by mouth 2 (two) times daily as needed for anxiety., Disp: 60 tablet, Rfl: 1 .  phentermine (ADIPEX-P) 37.5 MG tablet, Take 1 tablet (37.5 mg total) by mouth daily before breakfast., Disp: 30 tablet, Rfl: 2 .  acetaminophen (TYLENOL) 500 MG tablet, Take 2 tablets (1,000 mg total) by mouth every 6 (six) hours as needed (for pain scale < 4). (Patient not taking: Reported on 11/22/2018), Disp: 30 tablet, Rfl: 0  Review of Systems  Constitutional: Negative for appetite change, chills, fatigue and fever.  Respiratory: Negative for chest tightness and shortness of breath.   Cardiovascular: Negative for chest pain and palpitations.  Gastrointestinal: Negative for abdominal pain, nausea and vomiting.  Musculoskeletal: Positive for back pain.  Neurological: Negative for dizziness, weakness and numbness.    Social History   Tobacco Use  . Smoking status: Former Research scientist (life sciences)  . Smokeless tobacco: Never Used  . Tobacco comment: Quit in 2015. Smoked for 5 years.  Substance Use Topics  . Alcohol use: No      Objective:   BP 119/79 (BP Location: Left Arm, Patient Position: Sitting, Cuff Size: Large)   Pulse 96   Temp 98.7 F (37.1 C)  (Oral)   Resp 16   Wt 224 lb (101.6 kg)   SpO2 98%   BMI 34.06 kg/m  Vitals:   11/22/18 1702  BP: 119/79  Pulse: 96  Resp: 16  Temp: 98.7 F (37.1 C)  TempSrc: Oral  SpO2: 98%  Weight: 224 lb (101.6 kg)     Physical Exam Vitals signs reviewed.  Constitutional:      General: She is not in acute distress.    Appearance: She is well-developed. She is not diaphoretic.  Neck:     Musculoskeletal: Normal range of motion and neck supple.     Thyroid: No thyromegaly.     Vascular: No JVD.     Trachea: No tracheal deviation.  Cardiovascular:     Rate and Rhythm: Normal rate and regular rhythm.     Heart sounds: Normal heart sounds. No murmur. No friction rub. No gallop.   Pulmonary:     Effort: Pulmonary effort is normal. No respiratory distress.     Breath sounds: Normal breath sounds. No wheezing or rales.  Musculoskeletal:     Cervical back: She exhibits tenderness. She exhibits normal range of motion, no bony tenderness, no spasm and normal pulse.     Thoracic back: She exhibits tenderness. She exhibits normal range of motion, no bony tenderness and no spasm.     Lumbar back: She exhibits tenderness. She exhibits normal range of  motion, no bony tenderness, no spasm and normal pulse.  Lymphadenopathy:     Cervical: No cervical adenopathy.  Psychiatric:        Mood and Affect: Mood normal.        Behavior: Behavior normal.        Thought Content: Thought content normal.        Judgment: Judgment normal.        Assessment & Plan    1. Cervical spine pain Will get imaging as below since patient has never had any xrays done of the spine. Will treat with baclofen and meloxicam as below. I will f/u pending xrays. Call if worsening in the meantime.  - DG Cervical Spine Complete; Future - baclofen (LIORESAL) 10 MG tablet; Take 1 tablet (10 mg total) by mouth 3 (three) times daily.  Dispense: 30 each; Refill: 0 - meloxicam (MOBIC) 15 MG tablet; Take 1 tablet (15 mg total) by  mouth daily.  Dispense: 30 tablet; Refill: 0  2. Chronic bilateral low back pain without sciatica See above medical treatment plan. - DG Lumbar Spine Complete; Future - baclofen (LIORESAL) 10 MG tablet; Take 1 tablet (10 mg total) by mouth 3 (three) times daily.  Dispense: 30 each; Refill: 0 - meloxicam (MOBIC) 15 MG tablet; Take 1 tablet (15 mg total) by mouth daily.  Dispense: 30 tablet; Refill: 0  3. Difficulty concentrating Patient states she has never been tested for ADHD, but was talking to a coworker and they have the same symptoms. Her coworker has ADHD and anxiety. She would like to be tested for ADHD. Referral placed as below.  - Ambulatory referral to Psychology  4. GAD (generalized anxiety disorder) Wellbutrin ineffective. Will change to venlafaxine as below. I will see her back in 4-6 weeks to see how it is working.  - venlafaxine XR (EFFEXOR-XR) 75 MG 24 hr capsule; Take 1 capsule (75 mg total) by mouth daily with breakfast.  Dispense: 90 capsule; Refill: 1  5. Depression, major, single episode, mild ([place]) See above medical treatment plan. - venlafaxine XR (EFFEXOR-XR) 75 MG 24 hr capsule; Take 1 capsule (75 mg total) by mouth daily with breakfast.  Dispense: 90 capsule; Refill: Sor[place], PA-C  [facility]

## 2018-12-04 ENCOUNTER — Ambulatory Visit: Payer: Self-pay | Admitting: Physician Assistant

## 2018-12-06 ENCOUNTER — Telehealth: Payer: Self-pay | Admitting: Physician Assistant

## 2018-12-06 DIAGNOSIS — G8929 Other chronic pain: Secondary | ICD-10-CM

## 2018-12-06 DIAGNOSIS — M542 Cervicalgia: Secondary | ICD-10-CM

## 2018-12-06 DIAGNOSIS — M545 Low back pain: Secondary | ICD-10-CM

## 2018-12-06 MED ORDER — BACLOFEN 10 MG PO TABS: 10.0000 mg | ORAL_TABLET | Freq: Three times a day (TID) | ORAL | 0 refills | Status: DC

## 2018-12-06 NOTE — Telephone Encounter (Signed)
Patient is requesting refill on Baclofen 10 mg. Would like to get more than 30 pills because it only last her 10 days.   Sent to [place]

## 2018-12-06 NOTE — Telephone Encounter (Signed)
Please advise 

## 2018-12-06 NOTE — Telephone Encounter (Signed)
Sent in for her to [place]

## 2018-12-26 ENCOUNTER — Telehealth: Payer: Self-pay | Admitting: Physician Assistant

## 2018-12-26 NOTE — Telephone Encounter (Signed)
OK we will contact once we receive results to schedule her to come in to discuss

## 2018-12-26 NOTE — Telephone Encounter (Signed)
Pt called to say she had the adult ADHD testing done yesterday and we should be receiving results soon.  Thank s [place]

## 2019-01-01 ENCOUNTER — Encounter: Payer: Self-pay | Admitting: Physician Assistant

## 2019-01-01 ENCOUNTER — Ambulatory Visit: Payer: BC Managed Care – PPO | Admitting: Physician Assistant

## 2019-01-01 VITALS — BP 122/88 | HR 97 | Temp 98.4°F | Resp 16 | Wt 218.0 lb

## 2019-01-01 DIAGNOSIS — M545 Low back pain, unspecified: Secondary | ICD-10-CM

## 2019-01-01 DIAGNOSIS — F909 Attention-deficit hyperactivity disorder, unspecified type: Secondary | ICD-10-CM | POA: Insufficient documentation

## 2019-01-01 DIAGNOSIS — F900 Attention-deficit hyperactivity disorder, predominantly inattentive type: Secondary | ICD-10-CM | POA: Insufficient documentation

## 2019-01-01 DIAGNOSIS — M542 Cervicalgia: Secondary | ICD-10-CM | POA: Diagnosis not present

## 2019-01-01 DIAGNOSIS — G8929 Other chronic pain: Secondary | ICD-10-CM

## 2019-01-01 MED ORDER — AMPHETAMINE-DEXTROAMPHET ER 10 MG PO CP24: 10.0000 mg | ORAL_CAPSULE | Freq: Every day | ORAL | 0 refills | Status: DC

## 2019-01-01 MED ORDER — MELOXICAM 15 MG PO TABS: 15.0000 mg | ORAL_TABLET | Freq: Every day | ORAL | 5 refills | Status: DC

## 2019-01-01 NOTE — Telephone Encounter (Signed)
I called and spoke with patient. She states that the lady who done the testing is Lady Deutscher. Her phone number is [phone]. I tried calling Mrs. Nancy Nordmann, but received her voice message system. I left a detailed message about needing patient's results faxed to our office. I called patient back and advised her of this also. I asked patient to try reaching out to Mrs. Nancy Nordmann to follow up on this message. Patient agrees to contact Mrs. Nancy Nordmann.

## 2019-01-01 NOTE — Telephone Encounter (Signed)
Gwenette Greet, did you ever receive the ADHD testing results for this patient? This message is still hanging out in your nurse box.

## 2019-01-01 NOTE — Telephone Encounter (Signed)
Received paper work and scheduled patient for today.

## 2019-01-01 NOTE — Telephone Encounter (Signed)
No I have not. Can we call to see who she has seen and call to have them fax the note?  Thanks

## 2019-01-01 NOTE — Progress Notes (Signed)
Patient: Cynthia Glover Female    DOB: 18-Jul-1991   28 y.o.   MRN: [id number] Visit Date: 01/01/2019  Today's Provider: Mar Daring, PA-C   Chief Complaint  Patient presents with  . Follow-up    ADHD   Subjective:     HPI  Patient here today to discuss ADHD treatment. Patient was seen by Lady Deutscher and diagnosed patient on 12/25/18. She scored a 92 on the Brown's ADD assessment for adults (55-120 highly probable ADD). She was diagnosed as ADHD, predominantly inattentive.   Patient also need a refill on the Meloxicam.  Allergies  Allergen Reactions  . Penicillins Rash     Current Outpatient Medications:  .  baclofen (LIORESAL) 10 MG tablet, Take 1 tablet (10 mg total) by mouth 3 (three) times daily., Disp: 90 each, Rfl: 0 .  clonazePAM (KLONOPIN) 0.5 MG tablet, Take 1 tablet (0.5 mg total) by mouth 2 (two) times daily as needed for anxiety., Disp: 60 tablet, Rfl: 1 .  meloxicam (MOBIC) 15 MG tablet, Take 1 tablet (15 mg total) by mouth daily., Disp: 30 tablet, Rfl: 0 .  phentermine (ADIPEX-P) 37.5 MG tablet, Take 1 tablet (37.5 mg total) by mouth daily before breakfast., Disp: 30 tablet, Rfl: 2 .  venlafaxine XR (EFFEXOR-XR) 75 MG 24 hr capsule, Take 1 capsule (75 mg total) by mouth daily with breakfast., Disp: 90 capsule, Rfl: 1  Review of Systems  Constitutional: Negative.   Respiratory: Negative.   Cardiovascular: Negative.   Gastrointestinal: Negative.   Musculoskeletal: Positive for back pain and neck pain.  Psychiatric/Behavioral: Positive for decreased concentration.    Social History   Tobacco Use  . Smoking status: Former Research scientist (life sciences)  . Smokeless tobacco: Never Used  . Tobacco comment: Quit in 2015. Smoked for 5 years.  Substance Use Topics  . Alcohol use: No      Objective:   BP 122/88 (BP Location: Left Arm, Patient Position: Sitting, Cuff Size: Large)   Pulse 97   Temp 98.4 F (36.9 C) (Oral)   Resp 16   Wt 218 lb (98.9 kg)   LMP  11/18/2018   Breastfeeding No   BMI 33.15 kg/m  Vitals:   01/01/19 1750  BP: 122/88  Pulse: 97  Resp: 16  Temp: 98.4 F (36.9 C)  TempSrc: Oral  Weight: 218 lb (98.9 kg)     Physical Exam Vitals signs reviewed.  Constitutional:      Appearance: Normal appearance. She is well-developed. She is obese.  HENT:     Head: Normocephalic and atraumatic.  Neck:     Musculoskeletal: Normal range of motion and neck supple.  Pulmonary:     Effort: Pulmonary effort is normal. No respiratory distress.  Neurological:     Mental Status: She is alert.  Psychiatric:        Mood and Affect: Mood normal.        Behavior: Behavior normal.        Thought Content: Thought content normal.        Judgment: Judgment normal.        Assessment & Plan    1. Attention deficit hyperactivity disorder (ADHD), predominantly inattentive type Will start Adderall XR 10mg  as below. I will see her back in 4 weeks for dose adjustment as needed.  - amphetamine-dextroamphetamine (ADDERALL XR) 10 MG 24 hr capsule; Take 1 capsule (10 mg total) by mouth daily.  Dispense: 30 capsule; Refill: 0  2. Cervical spine  pain Stable. Diagnosis pulled for medication refill. Continue current medical treatment plan. - meloxicam (MOBIC) 15 MG tablet; Take 1 tablet (15 mg total) by mouth daily.  Dispense: 30 tablet; Refill: 5  3. Chronic bilateral low back pain without sciatica Stable. Diagnosis pulled for medication refill. Continue current medical treatment plan. - meloxicam (MOBIC) 15 MG tablet; Take 1 tablet (15 mg total) by mouth daily.  Dispense: 30 tablet; Refill: [place], PA-C  [facility]

## 2019-01-01 NOTE — Patient Instructions (Signed)
Amphetamine; Dextroamphetamine extended-release capsules What is this medicine? AMPHETAMINE; DEXTROAMPHETAMINE (am FET a meen; dex troe am FET a meen) is used to treat attention-deficit hyperactivity disorder (ADHD). Federal law prohibits giving this medicine to any person other than the person for whom it was prescribed. Do not share this medicine with anyone else. This medicine may be used for other purposes; ask your health care provider or pharmacist if you have questions. COMMON BRAND NAME(S): Adderall XR, Mydayis What should I tell my health care provider before I take this medicine? They need to know if you have any of these conditions: -anxiety or panic attacks -circulation problems in fingers and toes -glaucoma -hardening or blockages of the arteries or heart blood vessels -heart disease or a heart defect -high blood pressure -history of a drug or alcohol abuse problem -history of stroke -kidney disease -liver disease -mental illness -seizures -suicidal thoughts, plans, or attempt; a previous suicide attempt by you or a family member -thyroid disease -Tourette's syndrome -an unusual or allergic reaction to dextroamphetamine, other amphetamines, other medicines, foods, dyes, or preservatives -pregnant or trying to get pregnant -breast-feeding How should I use this medicine? Take this medicine by mouth with a glass of water. Follow the directions on the prescription label. This medicine is taken just one time per day, usually in the morning after waking up. Take with or without food. Do not chew or crush this medicine. You may open the capsules and sprinkle the medicine on a spoonful of applesauce. If sprinkled on applesauce, take the dose immediately and do not crush or chew. Do not take your medicine more often than directed. A special MedGuide will be given to you by the pharmacist with each prescription and refill. Be sure to read this information carefully each time. Talk to  your pediatrician regarding the use of this medicine in children. While this drug may be prescribed for children as young as 6 years for selected conditions, precautions do apply. Some extended-release capsules are recommended for use only in children 78 years of age and older. Overdosage: If you think you have taken too much of this medicine contact a poison control center or emergency room at once. NOTE: This medicine is only for you. Do not share this medicine with others. What if I miss a dose? If you miss a dose, take it as soon as you can in the morning, but do not take it later in the day because it can cause trouble sleeping. If it is almost time for your next dose, take only that dose. Do not take double or extra doses. What may interact with this medicine? Do not take this medicine with any of the following medications: -MAOIs like Carbex, Eldepryl, Marplan, Nardil, and Parnate -other stimulant medicines for attention disorders This medicine may also interact with the following medications: -acetazolamide -alcohol -ammonium chloride -antacids -ascorbic acid -atomoxetine -caffeine -certain medicines for blood pressure -certain medicines for depression, anxiety, or psychotic disturbances -certain medicines for seizures like carbamazepine, phenobarbital, phenytoin -certain medicines for stomach problems like cimetidine, ranitidine, famotidine, esomeprazole, omeprazole, lansoprazole, pantoprazole -lithium -medicines for colds and breathing difficulties -medicines for diabetes -medicines or dietary supplements for weight loss or to stay awake -methenamine -narcotic medicines for pain -quinidine -ritonavir -sodium bicarbonate -St. John's wort This list may not describe all possible interactions. Give your health care provider a list of all the medicines, herbs, non-prescription drugs, or dietary supplements you use. Also tell them if you smoke, drink alcohol, or use illegal drugs.  Some items may interact with your medicine. What should I watch for while using this medicine? Visit your doctor or health care professional for regular checks on your progress. This prescription requires that you follow special procedures with your doctor and pharmacy. You will need to have a new written prescription from your doctor every time you need a refill. This medicine may affect your concentration, or hide signs of tiredness. Until you know how this medicine affects you, do not drive, ride a bicycle, use machinery, or do anything that needs mental alertness. Alcohol should be avoided with some brands of this medicine. Talk to your doctor or health care professional if you have questions. Tell your doctor or health care professional if this medicine loses its effects, or if you feel you need to take more than the prescribed amount. Do not change the dosage without talking to your doctor or health care professional. Decreased appetite is a common side effect when starting this medicine. Eating small, frequent meals or snacks can help. Talk to your doctor if you continue to have poor eating habits. Height and weight growth of a child taking this medicine will be monitored closely. Do not take this medicine close to bedtime. It may prevent you from sleeping. If you are going to need surgery, an MRI, a CT scan, or other procedure, tell your doctor that you are taking this medicine. You may need to stop taking this medicine before the procedure. Tell your doctor or healthcare professional right away if you notice unexplained wounds on your fingers and toes while taking this medicine. You should also tell your healthcare provider if you experience numbness or pain, changes in the skin color, or sensitivity to temperature in your fingers or toes. What side effects may I notice from receiving this medicine? Side effects that you should report to your doctor or health care professional as soon as  possible: -allergic reactions like skin rash, itching or hives, swelling of the face, lips, or tongue -anxious -breathing problems -changes in emotions or moods -changes in vision -chest pain or chest tightness -fast, irregular heartbeat -fingers or toes feel numb, cool, painful -hallucination, loss of contact with reality -high blood pressure -males: prolonged or painful erection -seizures -signs and symptoms of serotonin syndrome like confusion, increased sweating, fever, tremor, stiff muscles, diarrhea -signs and symptoms of a stroke like changes in vision; confusion; trouble speaking or understanding; severe headaches; sudden numbness or weakness of the face, arm or leg; trouble walking; dizziness; loss of balance or coordination -suicidal thoughts or other mood changes -uncontrollable head, mouth, neck, arm, or leg movements Side effects that usually do not require medical attention (report to your doctor or health care professional if they continue or are bothersome): -dry mouth -headache -irritability -loss of appetite -nausea -trouble sleeping -weight loss This list may not describe all possible side effects. Call your doctor for medical advice about side effects. You may report side effects to FDA at 1-800-FDA-1088. Where should I keep my medicine? Keep out of the reach of children. This medicine can be abused. Keep your medicine in a safe place to protect it from theft. Do not share this medicine with anyone. Selling or giving away this medicine is dangerous and against the law. Store at room temperature between 15 and 30 degrees C (59 and 86 degrees F). Keep container tightly closed. Protect from light. Throw away any unused medicine after the expiration date. NOTE: This sheet is a summary. It may not cover all  possible information. If you have questions about this medicine, talk to your doctor, pharmacist, or health care provider.  2019 Elsevier/Gold Standard (2016-12-26  13:37:27)

## 2019-01-03 ENCOUNTER — Telehealth: Payer: Self-pay | Admitting: Physician Assistant

## 2019-01-03 NOTE — Telephone Encounter (Signed)
Opened in error

## 2019-01-10 ENCOUNTER — Telehealth: Payer: Self-pay | Admitting: Physician Assistant

## 2019-01-10 NOTE — Telephone Encounter (Signed)
Pt is calling about the insurance not covering her amphetamine-dextroamphetamine (ADDERALL XR) 10 MG 24 hr capsule.  Can this be looked into or why they wouldn't cover?  [place], [place] - [address] [phone] (Phone) [phone] (Fax)   Please advise.  Thanks, [employer]

## 2019-01-10 NOTE — Telephone Encounter (Signed)
Spoke with the pharmacy and they stated that patient medication was covered and patient had to pay a co-pay of $16.00. Patient was advised as well.

## 2019-01-29 ENCOUNTER — Ambulatory Visit: Payer: Self-pay | Admitting: Physician Assistant

## 2019-01-31 ENCOUNTER — Ambulatory Visit: Payer: Self-pay | Admitting: Physician Assistant

## 2019-01-31 ENCOUNTER — Telehealth: Payer: Self-pay | Admitting: Physician Assistant

## 2019-01-31 NOTE — Telephone Encounter (Signed)
Yes this is ok. I will call her Friday morning

## 2019-01-31 NOTE — Telephone Encounter (Signed)
Pt calling back to check on [facility].  Thanks, [employer]

## 2019-01-31 NOTE — Telephone Encounter (Signed)
Patient has appt today at 5:40 but she doesn't want to come in just for med refill. She wants to know if Tawanna Sat could call her and discuss this over the phone due to Harvey scare.   If so, then please cancel her appt. For today.

## 2019-02-02 ENCOUNTER — Ambulatory Visit (INDEPENDENT_AMBULATORY_CARE_PROVIDER_SITE_OTHER): Payer: BC Managed Care – PPO | Admitting: Physician Assistant

## 2019-02-02 DIAGNOSIS — F900 Attention-deficit hyperactivity disorder, predominantly inattentive type: Secondary | ICD-10-CM | POA: Diagnosis not present

## 2019-02-02 MED ORDER — AMPHETAMINE-DEXTROAMPHET ER 20 MG PO CP24: 20.0000 mg | ORAL_CAPSULE | ORAL | 0 refills | Status: DC

## 2019-02-02 NOTE — Telephone Encounter (Signed)
Pt called to give Cynthia Glover her new cell number to call her back at: [phone]  Thanks, [facility]

## 2019-02-02 NOTE — Telephone Encounter (Signed)
Can we call her and have her get the webex app on her phone and schedule her this evening for me to do an evisit with her please?  Thanks.

## 2019-02-05 ENCOUNTER — Other Ambulatory Visit: Payer: Self-pay

## 2019-02-05 DIAGNOSIS — F900 Attention-deficit hyperactivity disorder, predominantly inattentive type: Secondary | ICD-10-CM

## 2019-02-05 MED ORDER — AMPHETAMINE-DEXTROAMPHET ER 20 MG PO CP24: 20.0000 mg | ORAL_CAPSULE | ORAL | 0 refills | Status: DC

## 2019-02-05 NOTE — Telephone Encounter (Signed)
Patient would like medication send to [facility] on [address].Please advise.

## 2019-02-06 ENCOUNTER — Other Ambulatory Visit: Payer: Self-pay | Admitting: Physician Assistant

## 2019-02-06 DIAGNOSIS — M542 Cervicalgia: Secondary | ICD-10-CM

## 2019-02-06 DIAGNOSIS — G8929 Other chronic pain: Secondary | ICD-10-CM

## 2019-02-06 DIAGNOSIS — M545 Low back pain: Secondary | ICD-10-CM

## 2019-02-07 ENCOUNTER — Encounter: Payer: Self-pay | Admitting: Physician Assistant

## 2019-02-07 NOTE — Progress Notes (Signed)
Virtual Visit via Video Note  I connected with Cynthia Glover on 02/07/19 at  6:00 PM EDT by a video enabled telemedicine application and verified that I am speaking with the correct person using two identifiers.   I discussed the limitations of evaluation and management by telemedicine and the availability of in person appointments. The patient expressed understanding and agreed to proceed.  History of Present Illness: Cynthia Glover is a 28 yr old female that is following up her ADHD. She was started on Adderall XR 10mg  at last visit 4 weeks ago. She states overall she has really not noticed much change in her symptoms. She still finds it hard to focus on task and is easily distracted.    Observations/Objective: WDWN female appearing stated age. No apparent distress. Normocephalic, atraumatic. Neck supple and normal ROM. No respiratory distress. Normal mood and affect. Normal attention.    Assessment and Plan: 1. Attention deficit hyperactivity disorder (ADHD), predominantly inattentive type Increase Adderall to 20mg  XR daily from 10mg  XR. I will f/u with her in 4 weeks either in office or via e-visit again if COVID 19 pandemic still prevalent.   Follow Up Instructions: 4 weeks   I discussed the assessment and treatment plan with the patient. The patient was provided an opportunity to ask questions and all were answered. The patient agreed with the plan and demonstrated an understanding of the instructions.   The patient was advised to call back or seek an in-person evaluation if the symptoms worsen or if the condition fails to improve as anticipated.  I provided 10 minutes of non-face-to-face time during this encounter.   Mar Daring, PA-C

## 2019-02-09 ENCOUNTER — Encounter: Payer: Self-pay | Admitting: Family Medicine

## 2019-02-09 ENCOUNTER — Ambulatory Visit: Payer: BC Managed Care – PPO | Admitting: Family Medicine

## 2019-02-09 ENCOUNTER — Other Ambulatory Visit: Payer: Self-pay

## 2019-02-09 VITALS — BP 124/84 | HR 96 | Temp 97.8°F | Wt 211.2 lb

## 2019-02-09 DIAGNOSIS — Z319 Encounter for procreative management, unspecified: Secondary | ICD-10-CM

## 2019-02-09 DIAGNOSIS — Z30432 Encounter for removal of intrauterine contraceptive device: Secondary | ICD-10-CM | POA: Diagnosis not present

## 2019-02-09 NOTE — Progress Notes (Signed)
Patient: Cynthia Glover Female    DOB: May 12, 1991   28 y.o.   MRN: [id number] Visit Date: 02/09/2019  Today's Provider: Lavon Paganini, MD   Chief Complaint  Patient presents with  . IUD Removal   Subjective:     HPI  Has been taking prenatal vitamins. Is sexually active with 1 female partner.  Has 35 month old baby. Kyleena IUD inserted 06/08/18 at postpartum visit.  She wants to remove IUD to start trying to get pregnant again.  It took her 2 years of trying for the last pregnancy.   Allergies  Allergen Reactions  . Penicillins Rash     Current Outpatient Medications:  .  amphetamine-dextroamphetamine (ADDERALL XR) 20 MG 24 hr capsule, Take 1 capsule (20 mg total) by mouth every morning., Disp: 30 capsule, Rfl: 0 .  baclofen (LIORESAL) 10 MG tablet, Take 1 tablet (10 mg total) by mouth 3 (three) times daily., Disp: 90 each, Rfl: 0 .  venlafaxine XR (EFFEXOR-XR) 75 MG 24 hr capsule, Take 1 capsule (75 mg total) by mouth daily with breakfast., Disp: 90 capsule, Rfl: 1 .  clonazePAM (KLONOPIN) 0.5 MG tablet, Take 1 tablet (0.5 mg total) by mouth 2 (two) times daily as needed for anxiety. (Patient not taking: Reported on 02/09/2019), Disp: 60 tablet, Rfl: 1 .  meloxicam (MOBIC) 15 MG tablet, Take 1 tablet by mouth once daily (Patient not taking: Reported on 02/09/2019), Disp: 30 tablet, Rfl: 0 .  phentermine (ADIPEX-P) 37.5 MG tablet, Take 1 tablet (37.5 mg total) by mouth daily before breakfast. (Patient not taking: Reported on 02/09/2019), Disp: 30 tablet, Rfl: 2   Review of Systems  Constitutional: Negative.   Respiratory: Negative.   Cardiovascular: Negative.   Musculoskeletal: Negative.     Social History   Tobacco Use  . Smoking status: Former Research scientist (life sciences)  . Smokeless tobacco: Never Used  . Tobacco comment: Quit in 2015. Smoked for 5 years.  Substance Use Topics  . Alcohol use: No     Objective:   BP 124/84 (BP Location: Left Arm, Patient Position: Sitting, Cuff  Size: Large)   Pulse 96   Temp 97.8 F (36.6 C) (Oral)   Wt 211 lb 3.2 oz (95.8 kg)   SpO2 97%   BMI 32.11 kg/m  Vitals:   02/09/19 1336  BP: 124/84  Pulse: 96  Temp: 97.8 F (36.6 C)  TempSrc: Oral  SpO2: 97%  Weight: 211 lb 3.2 oz (95.8 kg)     Physical Exam Vitals signs reviewed.  Constitutional:      General: She is not in acute distress.    Appearance: Normal appearance. She is not diaphoretic.  HENT:     Head: Normocephalic and atraumatic.  Eyes:     General: No scleral icterus.    Conjunctiva/sclera: Conjunctivae normal.  Abdominal:     General: There is no distension.     Palpations: Abdomen is soft.     Tenderness: There is no abdominal tenderness.  Genitourinary:    Comments: GYN:  External genitalia within normal limits.  Vaginal mucosa pink, moist, normal rugae.  Nonfriable cervix without lesions, no discharge, some spotting noted on speculum exam.  Skin:    Capillary Refill: Capillary refill takes less than 2 seconds.     Findings: No rash.  Neurological:     Mental Status: She is alert and oriented to person, place, and time.  Psychiatric:        Mood and Affect:  Mood normal.        Behavior: Behavior normal.         Assessment & Plan  1. Encounter for IUD removal - see procedure note below  2. Patient desires pregnancy - discussed pregnancy spacing of at least 1 year - advised on prenatal vitamins and avoiding smoking and alcohol - discussed healthy eating    Procedure note:  Patient was given informed consent, signed copy in chart. Appropriate time out was taken. Area prepped and draped in usual sterile fashion.removed  After speculum insertion, IUD strings noted from cervical os.  These were grasped with ring forceps.  Patient advised to cough to increase abd pressure and traction applied to IUD strings.  IUD easily  reoved and intact. The patient tolerated the procedure well. There were no complications. Post procedure instructions were  given. Can take OTC analgesics as needed.   Return if symptoms worsen or fail to improve.   The entirety of the information documented in the History of Present Illness, Review of Systems and Physical Exam were personally obtained by me. Portions of this information were initially documented by Tiburcio Pea, CMA and reviewed by me for thoroughness and accuracy.    Virginia Crews, MD, MPH [facility] 02/09/2019 2:01 PM

## 2019-04-04 ENCOUNTER — Other Ambulatory Visit: Payer: Self-pay | Admitting: Physician Assistant

## 2019-04-04 DIAGNOSIS — F900 Attention-deficit hyperactivity disorder, predominantly inattentive type: Secondary | ICD-10-CM

## 2019-04-04 DIAGNOSIS — G8929 Other chronic pain: Secondary | ICD-10-CM

## 2019-04-04 DIAGNOSIS — M542 Cervicalgia: Secondary | ICD-10-CM

## 2019-04-04 MED ORDER — AMPHETAMINE-DEXTROAMPHET ER 20 MG PO CP24: 20.0000 mg | ORAL_CAPSULE | ORAL | 0 refills | Status: DC

## 2019-04-04 MED ORDER — MELOXICAM 15 MG PO TABS: 15.0000 mg | ORAL_TABLET | Freq: Every day | ORAL | 0 refills | Status: DC

## 2019-04-04 NOTE — Telephone Encounter (Signed)
Please review

## 2019-04-20 ENCOUNTER — Telehealth: Payer: Self-pay

## 2019-04-20 ENCOUNTER — Encounter: Payer: Self-pay | Admitting: Physician Assistant

## 2019-04-20 NOTE — Telephone Encounter (Signed)
Line busy will try later. Calling patient to let her know that her letter/form is ready for pick up.

## 2019-04-23 NOTE — Telephone Encounter (Signed)
Line still busy.

## 2019-04-25 NOTE — Telephone Encounter (Signed)
Patient advised as below.  

## 2019-05-07 ENCOUNTER — Other Ambulatory Visit: Payer: Self-pay | Admitting: Physician Assistant

## 2019-05-07 DIAGNOSIS — F900 Attention-deficit hyperactivity disorder, predominantly inattentive type: Secondary | ICD-10-CM

## 2019-05-07 MED ORDER — AMPHETAMINE-DEXTROAMPHET ER 20 MG PO CP24: 20.0000 mg | ORAL_CAPSULE | ORAL | 0 refills | Status: DC

## 2019-06-08 ENCOUNTER — Other Ambulatory Visit: Payer: Self-pay | Admitting: Physician Assistant

## 2019-06-08 DIAGNOSIS — F900 Attention-deficit hyperactivity disorder, predominantly inattentive type: Secondary | ICD-10-CM

## 2019-06-08 MED ORDER — AMPHETAMINE-DEXTROAMPHET ER 20 MG PO CP24
20.0000 mg | ORAL_CAPSULE | ORAL | 0 refills | Status: DC
Start: 2019-06-08 — End: 2019-07-13

## 2019-07-12 ENCOUNTER — Other Ambulatory Visit: Payer: Self-pay | Admitting: Physician Assistant

## 2019-07-12 DIAGNOSIS — M542 Cervicalgia: Secondary | ICD-10-CM

## 2019-07-12 DIAGNOSIS — G8929 Other chronic pain: Secondary | ICD-10-CM

## 2019-07-12 DIAGNOSIS — F900 Attention-deficit hyperactivity disorder, predominantly inattentive type: Secondary | ICD-10-CM

## 2019-07-13 ENCOUNTER — Other Ambulatory Visit: Payer: Self-pay | Admitting: Physician Assistant

## 2019-07-13 DIAGNOSIS — F900 Attention-deficit hyperactivity disorder, predominantly inattentive type: Secondary | ICD-10-CM

## 2019-07-13 MED ORDER — BACLOFEN 10 MG PO TABS
10.0000 mg | ORAL_TABLET | Freq: Three times a day (TID) | ORAL | 0 refills | Status: DC
Start: 2019-07-13 — End: 2019-07-19

## 2019-07-13 MED ORDER — AMPHETAMINE-DEXTROAMPHET ER 30 MG PO CP24: 30.0000 mg | ORAL_CAPSULE | ORAL | 0 refills | Status: DC

## 2019-07-13 NOTE — Telephone Encounter (Signed)
Refilled with increased adderal xr dose 30mg .

## 2019-07-13 NOTE — Telephone Encounter (Signed)
Patient of Cynthia Glover

## 2019-07-18 NOTE — Progress Notes (Signed)
Patient: Cynthia Glover Female    DOB: 05/05/1991   28 y.o.   MRN: [id number] Visit Date: 07/19/2019  Today's Provider: Mar Daring, PA-C   No chief complaint on file.  Subjective:    I,[name] E. Rosas,RMA am acting as a Education administrator for [employer], PA-C.  HPI  Patient here today to see if she can get off the Venlafaxine and to see if if she can up on the Adderall. Reports that she has been doing 20 mg of the Adderall and feels like is not working anymore. 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   She had originally been started on Venlafaxine after the death of her brother (died in a car accident). She feels she is doing better and would like to try to discontinue.    Allergies  Allergen Reactions  . Penicillins Rash     Current Outpatient Medications:  .  amphetamine-dextroamphetamine (ADDERALL XR) 30 MG 24 hr capsule, Take 1 capsule (30 mg total) by mouth every morning., Disp: 30 capsule, Rfl: 0 .  meloxicam (MOBIC) 15 MG tablet, Take 1 tablet (15 mg total) by mouth daily., Disp: 30 tablet, Rfl: 0  Review of Systems  Constitutional: Negative for appetite change, chills, fatigue and fever.  Respiratory: Negative for chest tightness and shortness of breath.   Cardiovascular: Negative for chest pain and palpitations.  Gastrointestinal: Negative for abdominal pain, nausea and vomiting.  Neurological: Negative for dizziness and weakness.  Psychiatric/Behavioral: Positive for decreased concentration. Negative for agitation, dysphoric mood and sleep disturbance. The patient is not nervous/anxious.     Social History   Tobacco Use  . Smoking status: Former Research scientist (life sciences)  . Smokeless tobacco: Never Used  . Tobacco comment: Quit in 2015. Smoked for 5 years.  Substance Use Topics  . Alcohol use: No      Objective:   BP 107/76 (BP Location: Left Arm, Patient Position: Sitting, Cuff Size: Large)   Pulse  89   Temp (!) 97.3 F (36.3 C) (Oral)   Resp 16   Wt 216 lb (98 kg)   BMI 32.84 kg/m  Vitals:   07/19/19 1352  BP: 107/76  Pulse: 89  Resp: 16  Temp: (!) 97.3 F (36.3 C)  TempSrc: Oral  Weight: 216 lb (98 kg)  Body mass index is 32.84 kg/m.   Physical Exam Vitals signs reviewed.  Constitutional:      General: She is not in acute distress.    Appearance: Normal appearance. She is well-developed. She is obese. She is not ill-appearing or diaphoretic.  Neck:     Musculoskeletal: Normal range of motion and neck supple.     Thyroid: No thyromegaly.     Vascular: No JVD.     Trachea: No tracheal deviation.  Cardiovascular:     Rate and Rhythm: Normal rate and regular rhythm.     Heart sounds: Normal heart sounds. No murmur. No friction rub. No gallop.   Pulmonary:     Effort: Pulmonary effort is normal. No respiratory distress.     Breath sounds: Normal breath sounds. No wheezing or rales.  Lymphadenopathy:     Cervical: No cervical adenopathy.  Neurological:     Mental Status: She is alert.  Psychiatric:        Mood and Affect: Mood normal.        Behavior: Behavior normal.  Thought Content: Thought content normal.        Judgment: Judgment normal.      No results found for any visits on 07/19/19.     Assessment & Plan    1. Attention deficit hyperactivity disorder (ADHD), predominantly inattentive type Increased Adderall XR to 30mg . Awaiting prior auth. Will send a week of 20mg  XR to cover until the 30mg  gets approved. She agrees.  - amphetamine-dextroamphetamine (ADDERALL XR) 20 MG 24 hr capsule; Take 1 capsule (20 mg total) by mouth every morning.  Dispense: 7 capsule; Refill: 0  2. Situational mixed anxiety and depressive disorder Tapering down dose. 51m5mg  x 1 week, then every other day until completed, then stop.  - venlafaxine XR (EFFEXOR XR) 37.5 MG 24 hr capsule; Take one capsule daily x 1 week, then take 1 capsule every other day until completed   Dispense: 14 capsule; Refill: 0     Mar Daring, PA-C  [facility]

## 2019-07-19 ENCOUNTER — Encounter: Payer: Self-pay | Admitting: Physician Assistant

## 2019-07-19 ENCOUNTER — Other Ambulatory Visit: Payer: Self-pay

## 2019-07-19 ENCOUNTER — Ambulatory Visit: Payer: BC Managed Care – PPO | Admitting: Physician Assistant

## 2019-07-19 ENCOUNTER — Telehealth: Payer: Self-pay | Admitting: Physician Assistant

## 2019-07-19 VITALS — BP 107/76 | HR 89 | Temp 97.3°F | Resp 16 | Wt 216.0 lb

## 2019-07-19 DIAGNOSIS — F900 Attention-deficit hyperactivity disorder, predominantly inattentive type: Secondary | ICD-10-CM | POA: Diagnosis not present

## 2019-07-19 DIAGNOSIS — F4323 Adjustment disorder with mixed anxiety and depressed mood: Secondary | ICD-10-CM

## 2019-07-19 MED ORDER — AMPHETAMINE-DEXTROAMPHET ER 20 MG PO CP24: 20.0000 mg | ORAL_CAPSULE | ORAL | 0 refills | Status: DC

## 2019-07-19 MED ORDER — VENLAFAXINE HCL ER 37.5 MG PO CP24: ORAL_CAPSULE | ORAL | 0 refills | Status: DC

## 2019-07-19 NOTE — Telephone Encounter (Signed)
Adderall XR 30mg  got approved. She can pick this up instead and we can call to cancel adderall xr20mg .

## 2019-07-20 NOTE — Telephone Encounter (Signed)
Patient advised and she picked up the 20mg  med yesterday.

## 2019-08-20 ENCOUNTER — Encounter: Payer: Self-pay | Admitting: Physician Assistant

## 2019-08-20 DIAGNOSIS — Z713 Dietary counseling and surveillance: Secondary | ICD-10-CM

## 2019-08-20 DIAGNOSIS — Z6832 Body mass index (BMI) 32.0-32.9, adult: Secondary | ICD-10-CM

## 2019-08-20 MED ORDER — PHENTERMINE HCL 37.5 MG PO TABS: 37.5000 mg | ORAL_TABLET | Freq: Every day | ORAL | 0 refills | Status: DC

## 2019-08-26 ENCOUNTER — Other Ambulatory Visit: Payer: Self-pay | Admitting: Physician Assistant

## 2019-08-26 DIAGNOSIS — F900 Attention-deficit hyperactivity disorder, predominantly inattentive type: Secondary | ICD-10-CM

## 2019-08-28 ENCOUNTER — Other Ambulatory Visit: Payer: Self-pay | Admitting: Physician Assistant

## 2019-08-28 DIAGNOSIS — F900 Attention-deficit hyperactivity disorder, predominantly inattentive type: Secondary | ICD-10-CM

## 2019-08-28 MED ORDER — AMPHETAMINE-DEXTROAMPHET ER 30 MG PO CP24: 30.0000 mg | ORAL_CAPSULE | ORAL | 0 refills | Status: DC

## 2019-09-30 ENCOUNTER — Other Ambulatory Visit: Payer: Self-pay | Admitting: Family Medicine

## 2019-09-30 DIAGNOSIS — F900 Attention-deficit hyperactivity disorder, predominantly inattentive type: Secondary | ICD-10-CM

## 2019-10-01 MED ORDER — AMPHETAMINE-DEXTROAMPHET ER 30 MG PO CP24: 30.0000 mg | ORAL_CAPSULE | ORAL | 0 refills | Status: DC

## 2019-11-01 ENCOUNTER — Other Ambulatory Visit: Payer: Self-pay | Admitting: Physician Assistant

## 2019-11-01 DIAGNOSIS — F900 Attention-deficit hyperactivity disorder, predominantly inattentive type: Secondary | ICD-10-CM

## 2019-11-01 MED ORDER — AMPHETAMINE-DEXTROAMPHET ER 30 MG PO CP24: 30.0000 mg | ORAL_CAPSULE | ORAL | 0 refills | Status: DC

## 2019-12-08 ENCOUNTER — Other Ambulatory Visit: Payer: Self-pay | Admitting: Physician Assistant

## 2019-12-08 DIAGNOSIS — F900 Attention-deficit hyperactivity disorder, predominantly inattentive type: Secondary | ICD-10-CM

## 2019-12-10 MED ORDER — AMPHETAMINE-DEXTROAMPHET ER 30 MG PO CP24: 30.0000 mg | ORAL_CAPSULE | ORAL | 0 refills | Status: DC

## 2020-01-10 ENCOUNTER — Other Ambulatory Visit: Payer: Self-pay | Admitting: Physician Assistant

## 2020-01-10 DIAGNOSIS — F900 Attention-deficit hyperactivity disorder, predominantly inattentive type: Secondary | ICD-10-CM

## 2020-01-10 MED ORDER — AMPHETAMINE-DEXTROAMPHET ER 30 MG PO CP24: 30.0000 mg | ORAL_CAPSULE | ORAL | 0 refills | Status: DC

## 2020-02-08 ENCOUNTER — Other Ambulatory Visit: Payer: Self-pay | Admitting: Physician Assistant

## 2020-02-08 DIAGNOSIS — Z713 Dietary counseling and surveillance: Secondary | ICD-10-CM

## 2020-02-08 DIAGNOSIS — Z6832 Body mass index (BMI) 32.0-32.9, adult: Secondary | ICD-10-CM

## 2020-02-08 DIAGNOSIS — F900 Attention-deficit hyperactivity disorder, predominantly inattentive type: Secondary | ICD-10-CM

## 2020-02-08 MED ORDER — AMPHETAMINE-DEXTROAMPHET ER 30 MG PO CP24: 30.0000 mg | ORAL_CAPSULE | ORAL | 0 refills | Status: DC

## 2020-03-10 ENCOUNTER — Other Ambulatory Visit: Payer: Self-pay | Admitting: Physician Assistant

## 2020-03-10 DIAGNOSIS — F900 Attention-deficit hyperactivity disorder, predominantly inattentive type: Secondary | ICD-10-CM

## 2020-03-10 MED ORDER — AMPHETAMINE-DEXTROAMPHET ER 30 MG PO CP24: 30.0000 mg | ORAL_CAPSULE | ORAL | 0 refills | Status: DC

## 2020-04-08 ENCOUNTER — Other Ambulatory Visit: Payer: Self-pay | Admitting: Physician Assistant

## 2020-04-08 DIAGNOSIS — F900 Attention-deficit hyperactivity disorder, predominantly inattentive type: Secondary | ICD-10-CM

## 2020-04-08 MED ORDER — AMPHETAMINE-DEXTROAMPHET ER 30 MG PO CP24: 30.0000 mg | ORAL_CAPSULE | ORAL | 0 refills | Status: DC

## 2020-04-15 ENCOUNTER — Emergency Department: Payer: BC Managed Care – PPO

## 2020-04-15 ENCOUNTER — Ambulatory Visit: Payer: Self-pay | Admitting: Physician Assistant

## 2020-04-15 ENCOUNTER — Emergency Department
Admission: EM | Admit: 2020-04-15 | Discharge: 2020-04-15 | Disposition: A | Discharge status: Home / Self Care | Payer: BC Managed Care – PPO | Attending: Emergency Medicine | Admitting: Emergency Medicine

## 2020-04-15 ENCOUNTER — Other Ambulatory Visit: Payer: Self-pay

## 2020-04-15 DIAGNOSIS — H538 Other visual disturbances: Secondary | ICD-10-CM | POA: Insufficient documentation

## 2020-04-15 DIAGNOSIS — R519 Headache, unspecified: Secondary | ICD-10-CM | POA: Insufficient documentation

## 2020-04-15 DIAGNOSIS — R112 Nausea with vomiting, unspecified: Secondary | ICD-10-CM | POA: Diagnosis not present

## 2020-04-15 LAB — URINALYSIS, COMPLETE (UACMP) WITH MICROSCOPIC
Bacteria, UA: NONE SEEN
Bilirubin Urine: NEGATIVE
Glucose, UA: NEGATIVE mg/dL
Hgb urine dipstick: NEGATIVE
Ketones, ur: NEGATIVE mg/dL
Leukocytes,Ua: NEGATIVE
Nitrite: NEGATIVE
Protein, ur: NEGATIVE mg/dL
Specific Gravity, Urine: 1.017 (ref 1.005–1.030)
pH: 7 (ref 5.0–8.0)

## 2020-04-15 LAB — CBC
HCT: 44.4 % (ref 36.0–46.0)
Hemoglobin: 15.6 g/dL — ABNORMAL HIGH (ref 12.0–15.0)
MCH: 31.5 pg (ref 26.0–34.0)
MCHC: 35.1 g/dL (ref 30.0–36.0)
MCV: 89.5 fL (ref 80.0–100.0)
Platelets: 218 10*3/uL (ref 150–400)
RBC: 4.96 MIL/uL (ref 3.87–5.11)
RDW: 12.8 % (ref 11.5–15.5)
WBC: 6.4 10*3/uL (ref 4.0–10.5)
nRBC: 0 % (ref 0.0–0.2)

## 2020-04-15 LAB — LIPASE, BLOOD: Lipase: 40 U/L (ref 11–51)

## 2020-04-15 LAB — COMPREHENSIVE METABOLIC PANEL
ALT: 24 U/L (ref 0–44)
AST: 32 U/L (ref 15–41)
Albumin: 4.6 g/dL (ref 3.5–5.0)
Alkaline Phosphatase: 57 U/L (ref 38–126)
Anion gap: 9 (ref 5–15)
BUN: 14 mg/dL (ref 6–20)
CO2: 25 mmol/L (ref 22–32)
Calcium: 9.3 mg/dL (ref 8.9–10.3)
Chloride: 102 mmol/L (ref 98–111)
Creatinine, Ser: 0.8 mg/dL (ref 0.44–1.00)
GFR calc Af Amer: >60 mL/min (ref >60)
GFR calc non Af Amer: >60 mL/min (ref >60)
Glucose, Bld: 92 mg/dL (ref 70–99)
Potassium: 4.2 mmol/L (ref 3.5–5.1)
Sodium: 136 mmol/L (ref 135–145)
Total Bilirubin: 1.1 mg/dL (ref 0.3–1.2)
Total Protein: 7.7 g/dL (ref 6.5–8.1)

## 2020-04-15 LAB — POCT PREGNANCY, URINE: Preg Test, Ur: NEGATIVE

## 2020-04-15 MED ORDER — BUTALBITAL-APAP-CAFFEINE 50-325-40 MG PO TABS: 1.0000 | ORAL_TABLET | Freq: Four times a day (QID) | ORAL | 0 refills | Status: DC | PRN

## 2020-04-15 NOTE — ED Notes (Signed)
E-signature not working at this time. Pt verbalized understanding of D/C instructions, prescriptions and follow up care with no further questions at this time. Pt in NAD and ambulatory at time of D/C.  

## 2020-04-15 NOTE — ED Triage Notes (Signed)
Pt states vomiting since yesterday and blurred vision today (started about 11am). A&O, ambulatory. No distress noted. Denies diarrhea or fever.

## 2020-04-15 NOTE — Discharge Instructions (Addendum)
Please call the number provided for ophthalmology as well as neurology to arrange follow-up appointments for further evaluation.  Return to the emergency department for any significant increase in headache, weakness or numbness, fever or any other symptom personally concerning to yourself.

## 2020-04-15 NOTE — Telephone Encounter (Signed)
Pt reports vomiting since yesterday, 4-5 times yesterday, 4 times today.  States "Unable to keep anything down, even water." Reports blurred vision at times, denies dizziness. Reports urine dark. Also reports lower abdominal "Pressure type pain." No diarrhea, no headache. "Maybe my blood sugar is low." No h/o DM. Pt directed to UC, states will follow disposition. Care advise given per protocol. Pt verbalizes understanding.  Reason for Disposition . [1] Drinking very little AND [2] dehydration suspected (e.g., no urine > 12 hours, very dry mouth, very lightheaded)  Answer Assessment - Initial Assessment Questions 1. VOMITING SEVERITY: "How many times have you vomited in the past 24 hours?"     - MILD:  1 - 2 times/day    - MODERATE: 3 - 5 times/day, decreased oral intake without significant weight loss or symptoms of dehydration    - SEVERE: 6 or more times/day, vomits everything or nearly everything, with significant weight loss, symptoms of dehydration     4-5 yesterday, today 4 times 2. ONSET: "When did the vomiting begin?"      Yesterday 3. FLUIDS: "What fluids or food have you vomited up today?" "Have you been able to keep any fluids down?"     Unable to keep anything down 4. ABDOMINAL PAIN: "Are your having any abdominal pain?" If yes : "How bad is it and what does it feel like?" (e.g., crampy, dull, intermittent, constant)      Lower abdominal pain, "Like a pressure" 5. DIARRHEA: "Is there any diarrhea?" If so, ask: "How many times today?"      No 6. CONTACTS: "Is there anyone else in the family with the same symptoms?"     no 7. CAUSE: "What do you think is causing your vomiting?"     Not sure 8. HYDRATION STATUS: "Any signs of dehydration?" (e.g., dry mouth [not only dry lips], too weak to stand) "When did you last urinate?"     Urine dark 9. OTHER SYMPTOMS: "Do you have any other symptoms?" (e.g., fever, headache, vertigo, vomiting blood or coffee grounds, recent head injury)  Blurred vision at times  Protocols used: [facility]

## 2020-04-15 NOTE — ED Triage Notes (Signed)
First nurse note- vomiting and blurry vision since yesterday. Ambulatory. NAD

## 2020-04-15 NOTE — ED Provider Notes (Signed)
[facility] Emergency Department Provider Note  Time seen: 6:09 PM  I have reviewed the triage vital signs and the nursing notes.   HISTORY  Chief Complaint Emesis and Blurred Vision   HPI Cynthia Glover is a 29 y.o. female with a past medical history of anxiety, migraines, presents to the emergency department for headache, blurred vision and nausea/vomiting.  According to the patient for the past years she has been experiencing frequent headaches, has been diagnosed with a migraine disorder.  States over the past year she is also been experiencing episodes of blurred vision which she had this morning.  States that has resolved by now.  Still states a moderate headache but denies any visual symptoms.  No fever, no neck pain.  Patient states she had to leave work today and she wanted to come to the emergency department to get evaluated.   Patient does state intermittent nausea and vomiting as well which is fairly typical with her headaches.  Past Medical History:  Diagnosis Date  . Anxiety   . Depression     Patient Active Problem List   Diagnosis Date Noted  . Attention deficit hyperactivity disorder (ADHD), predominantly inattentive type 01/01/2019  . Labor and delivery indication for care or intervention 04/20/2018  . Migraine without aura and without status migrainosus, not intractable 01/09/2018  . 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 Obesity 03/31/2016  . BMI 32.0-32.9,adult 03/31/2016    History reviewed. No pertinent surgical history.  Prior to Admission medications   Medication Sig Start Date End Date Taking? Authorizing Provider  amphetamine-dextroamphetamine (ADDERALL XR) 30 MG 24 hr capsule Take 1 capsule (30 mg total) by mouth every morning. 04/08/20   Mar Daring, PA-C  phentermine (ADIPEX-P) 37.5 MG tablet Take 1 tablet (37.5 mg total) by mouth daily before breakfast. 08/20/19   Mar Daring, PA-C   venlafaxine XR (EFFEXOR XR) 37.5 MG 24 hr capsule Take one capsule daily x 1 week, then take 1 capsule every other day until completed 07/19/19   Mar Daring, PA-C    Allergies  Allergen Reactions  . Penicillins Rash    Family History  Problem Relation Age of Onset  . Depression Father   . Diabetes Father   . Depression Brother   . Anxiety disorder Brother     Social History Social History   Tobacco Use  . Smoking status: Former Research scientist (life sciences)  . Smokeless tobacco: Never Used  . Tobacco comment: Quit in 2015. Smoked for 5 years.  Substance Use Topics  . Alcohol use: No  . Drug use: No    Review of Systems Constitutional: Negative for fever. Eyes: Intermittent blurred vision x1 year. Cardiovascular: Negative for chest pain. Respiratory: Negative for shortness of breath. Gastrointestinal: Negative for abdominal pain Genitourinary: Negative for urinary compaints Musculoskeletal: Negative for musculoskeletal complaints Neurological: Moderate headache.  Denies any weakness or numbness. All other ROS negative  ____________________________________________   PHYSICAL EXAM:  VITAL SIGNS: ED Triage Vitals [04/15/20 1547]  Enc Vitals Group     BP (!) 129/94     Pulse Rate 72     Resp 16     Temp 97.6 F (36.4 C)     Temp Source Oral     SpO2 99 %     Weight 192 lb (87.1 kg)     Height 5\' 8"  (1.727 m)     Head Circumference      Peak Flow  Pain Score 0     Pain Loc      Pain Edu?      Excl. in [place]?    Constitutional: Alert and oriented. Well appearing and in no distress. Eyes: Normal exam ENT      Head: Normocephalic and atraumatic.      Mouth/Throat: Mucous membranes are moist. Cardiovascular: Normal rate, regular rhythm.  Respiratory: Normal respiratory effort without tachypnea nor retractions. Breath sounds are clear  Gastrointestinal: Soft and nontender. No distention.  Musculoskeletal: Nontender with normal range of motion in all  extremities. Neurologic:  Normal speech and language. No gross focal neurologic deficits.  Equal grip strengths.  Normal exam.  No pronator drift. Skin:  Skin is warm, dry and intact.  Psychiatric: Mood and affect are normal.   ____________________________________________   RADIOLOGY  CT scan shows partially empty sella turcica.  ____________________________________________   INITIAL IMPRESSION / ASSESSMENT AND PLAN / ED COURSE  Pertinent labs & imaging results that were available during my care of the patient were reviewed by me and considered in my medical decision making (see chart for details).   Patient presents emergency department for intermittent headaches along with blurred vision nausea vomiting.  Patient states her headaches have been intermittent ongoing for the past year but have been getting worse.  Patient's exam is reassuring including a reassuring neurological exam.  Patient's CT scan shows signs of possible idiopathic intracranial hypertension.  I spoke to the patient regarding this as well as the option to proceed with attempted lumbar puncture.  Patient wishes to proceed and verbally consented after speaking of the pros and cons.  However upon first attempt to do the lumbar puncture patient states too much discomfort and wishes to abort the procedure.  Given the patient's CT findings we will refer to ophthalmology as well as neurology.  We will discharge home Fioricet.  Patient agreeable to plan of care.  [name] was evaluated in Emergency Department on 04/15/2020 for the symptoms described in the history of present illness. She was evaluated in the context of the global COVID-19 pandemic, which necessitated consideration that the patient might be at risk for infection with the SARS-CoV-2 virus that causes COVID-19. Institutional protocols and algorithms that pertain to the evaluation of patients at risk for COVID-19 are in a state of rapid change based on information  released by regulatory bodies including the CDC and federal and state organizations. These policies and algorithms were followed during the patient's care in the ED.  ____________________________________________   FINAL CLINICAL IMPRESSION(S) / ED DIAGNOSES  Headache Blurred vision   Harvest Dark, MD 04/15/20 1812

## 2020-05-05 ENCOUNTER — Other Ambulatory Visit: Payer: Self-pay | Admitting: Neurology

## 2020-05-05 ENCOUNTER — Other Ambulatory Visit: Payer: Self-pay | Admitting: Physician Assistant

## 2020-05-05 DIAGNOSIS — H538 Other visual disturbances: Secondary | ICD-10-CM

## 2020-05-05 DIAGNOSIS — G43119 Migraine with aura, intractable, without status migrainosus: Secondary | ICD-10-CM

## 2020-05-05 DIAGNOSIS — F900 Attention-deficit hyperactivity disorder, predominantly inattentive type: Secondary | ICD-10-CM

## 2020-05-05 NOTE — Telephone Encounter (Signed)
Please advise 

## 2020-05-06 MED ORDER — AMPHETAMINE-DEXTROAMPHET ER 30 MG PO CP24: 30.0000 mg | ORAL_CAPSULE | ORAL | 0 refills | Status: DC

## 2020-05-06 NOTE — Telephone Encounter (Signed)
I sent in 1 prescription for 30 days.  She has not been seen since September of last year and she seems to be taking this every day of the week.

## 2020-06-06 ENCOUNTER — Other Ambulatory Visit: Payer: Self-pay | Admitting: Family Medicine

## 2020-06-06 DIAGNOSIS — F900 Attention-deficit hyperactivity disorder, predominantly inattentive type: Secondary | ICD-10-CM

## 2020-06-06 MED ORDER — AMPHETAMINE-DEXTROAMPHET ER 30 MG PO CP24: 30.0000 mg | ORAL_CAPSULE | ORAL | 0 refills | Status: DC

## 2020-06-23 ENCOUNTER — Ambulatory Visit: Payer: BC Managed Care – PPO

## 2020-07-10 ENCOUNTER — Other Ambulatory Visit: Payer: Self-pay | Admitting: Physician Assistant

## 2020-07-10 DIAGNOSIS — F900 Attention-deficit hyperactivity disorder, predominantly inattentive type: Secondary | ICD-10-CM

## 2020-07-10 MED ORDER — AMPHETAMINE-DEXTROAMPHET ER 30 MG PO CP24: 30.0000 mg | ORAL_CAPSULE | ORAL | 0 refills | Status: DC

## 2020-08-07 ENCOUNTER — Other Ambulatory Visit: Payer: Self-pay | Admitting: Physician Assistant

## 2020-08-07 DIAGNOSIS — F900 Attention-deficit hyperactivity disorder, predominantly inattentive type: Secondary | ICD-10-CM

## 2020-08-07 MED ORDER — AMPHETAMINE-DEXTROAMPHET ER 30 MG PO CP24: 30.0000 mg | ORAL_CAPSULE | ORAL | 0 refills | Status: DC

## 2020-09-05 ENCOUNTER — Other Ambulatory Visit: Payer: Self-pay | Admitting: Physician Assistant

## 2020-09-05 DIAGNOSIS — F900 Attention-deficit hyperactivity disorder, predominantly inattentive type: Secondary | ICD-10-CM

## 2020-09-05 MED ORDER — AMPHETAMINE-DEXTROAMPHET ER 30 MG PO CP24: 30.0000 mg | ORAL_CAPSULE | ORAL | 0 refills | Status: DC

## 2020-10-03 ENCOUNTER — Other Ambulatory Visit: Payer: Self-pay | Admitting: Physician Assistant

## 2020-10-03 DIAGNOSIS — F900 Attention-deficit hyperactivity disorder, predominantly inattentive type: Secondary | ICD-10-CM

## 2020-10-03 MED ORDER — AMPHETAMINE-DEXTROAMPHET ER 30 MG PO CP24: 30.0000 mg | ORAL_CAPSULE | ORAL | 0 refills | Status: DC

## 2020-11-03 ENCOUNTER — Other Ambulatory Visit: Payer: Self-pay | Admitting: Physician Assistant

## 2020-11-03 DIAGNOSIS — F900 Attention-deficit hyperactivity disorder, predominantly inattentive type: Secondary | ICD-10-CM

## 2020-11-03 MED ORDER — AMPHETAMINE-DEXTROAMPHET ER 30 MG PO CP24: 30.0000 mg | ORAL_CAPSULE | ORAL | 0 refills | Status: DC

## 2020-12-05 ENCOUNTER — Other Ambulatory Visit: Payer: Self-pay | Admitting: Physician Assistant

## 2020-12-05 DIAGNOSIS — F900 Attention-deficit hyperactivity disorder, predominantly inattentive type: Secondary | ICD-10-CM

## 2020-12-05 MED ORDER — AMPHETAMINE-DEXTROAMPHET ER 30 MG PO CP24: 30.0000 mg | ORAL_CAPSULE | ORAL | 0 refills | Status: DC

## 2020-12-30 ENCOUNTER — Ambulatory Visit: Payer: Self-pay | Admitting: *Deleted

## 2020-12-30 NOTE — Telephone Encounter (Signed)
C/o headaches and blurred vision for the past 4-6 months. C/o light sensitivity , nausea and vomiting x 1 a day. Headache in frontal area of head and temple regions. Patient takes 2-3 BC powders a day for headaches. Relief of headaches lasts less than 8 hours. C/o blurred vision and went to "eye dr" 6 months ago and reported "built up fluid". No medication or f/u given to patient per patient. C/o neck pain but denies stiff neck. C/o pressure behind eyes at times when headaches noted. Denies fever, cold symptoms, unsteady balance, weakness on either side. My chart video appt scheduled for 12/31/20. Care advise given. Patient verbalized understanding of care advise and to call back or go to ED if symptoms worsen.   Reason for Disposition . [1] MODERATE headache (e.g., interferes with normal activities) AND [2] present > 24 hours AND [3] unexplained  (Exceptions: analgesics not tried, typical migraine, or headache part of viral illness)  Answer Assessment - Initial Assessment Questions 1. LOCATION: "Where does it hurt?"      Frontal area of head and temples 2. ONSET: "When did the headache start?" (Minutes, hours or days)      4-6 months ago  3. PATTERN: "Does the pain come and go, or has it been constant since it started?"     Comes and goes but becoming more intense 4. SEVERITY: "How bad is the pain?" and "What does it keep you from doing?"  (e.g., Scale 1-10; mild, moderate, or severe)   - MILD (1-3): doesn't interfere with normal activities    - MODERATE (4-7): interferes with normal activities or awakens from sleep    - SEVERE (8-10): excruciating pain, unable to do any normal activities        moderate 5. RECURRENT SYMPTOM: "Have you ever had headaches before?" If Yes, ask: "When was the last time?" and "What happened that time?"      Not like these 6. CAUSE: "What do you think is causing the headache?"     Not sure  7. MIGRAINE: "Have you been diagnosed with migraine headaches?" If Yes, ask:  "Is this headache similar?"      no 8. HEAD INJURY: "Has there been any recent injury to the head?"      No  9. OTHER SYMPTOMS: "Do you have any other symptoms?" (fever, stiff neck, eye pain, sore throat, cold symptoms)     Eye pain, neck pain, feels like pressure behind eyes. 10. PREGNANCY: "Is there any chance you are pregnant?" "When was your last menstrual period?"       LMP With in the last month, not pregnant  Protocols used: [facility]

## 2020-12-31 ENCOUNTER — Telehealth (INDEPENDENT_AMBULATORY_CARE_PROVIDER_SITE_OTHER): Payer: BC Managed Care – PPO | Admitting: Physician Assistant

## 2020-12-31 ENCOUNTER — Encounter: Payer: Self-pay | Admitting: Physician Assistant

## 2020-12-31 DIAGNOSIS — G43101 Migraine with aura, not intractable, with status migrainosus: Secondary | ICD-10-CM

## 2020-12-31 DIAGNOSIS — F900 Attention-deficit hyperactivity disorder, predominantly inattentive type: Secondary | ICD-10-CM | POA: Diagnosis not present

## 2020-12-31 MED ORDER — TOPIRAMATE 25 MG PO TABS: 25.0000 mg | ORAL_TABLET | Freq: Two times a day (BID) | ORAL | 1 refills | Status: DC

## 2020-12-31 MED ORDER — SUMATRIPTAN SUCCINATE 25 MG PO TABS: ORAL_TABLET | ORAL | 5 refills | Status: DC

## 2020-12-31 NOTE — Patient Instructions (Signed)
Migraine Headache A migraine headache is an intense, throbbing pain on one side or both sides of the head. Migraine headaches may also cause other symptoms, such as nausea, vomiting, and sensitivity to light and noise. A migraine headache can last from 4 hours to 3 days. Talk with your doctor about what things may bring on (trigger) your migraine headaches. What are the causes? The exact cause of this condition is not known. However, a migraine may be caused when nerves in the brain become irritated and release chemicals that cause inflammation of blood vessels. This inflammation causes pain. This condition may be triggered or caused by:  Drinking alcohol.  Smoking.  Taking medicines, such as: ? Medicine used to treat chest pain (nitroglycerin). ? Birth control pills. ? Estrogen. ? Certain blood pressure medicines.  Eating or drinking products that contain nitrates, glutamate, aspartame, or tyramine. Aged cheeses, chocolate, or caffeine may also be triggers.  Doing physical activity. Other things that may trigger a migraine headache include:  Menstruation.  Pregnancy.  Hunger.  Stress.  Lack of sleep or too much sleep.  Weather changes.  Fatigue. What increases the risk? The following factors may make you more likely to experience migraine headaches:  Being a certain age. This condition is more common in people who are 25-55 years old.  Being female.  Having a family history of migraine headaches.  Being Caucasian.  Having a mental health condition, such as depression or anxiety.  Being obese. What are the signs or symptoms? The main symptom of this condition is pulsating or throbbing pain. This pain may:  Happen in any area of the head, such as on one side or both sides.  Interfere with daily activities.  Get worse with physical activity.  Get worse with exposure to bright lights or loud noises. Other symptoms may  include:  Nausea.  Vomiting.  Dizziness.  General sensitivity to bright lights, loud noises, or smells. Before you get a migraine headache, you may get warning signs (an aura). An aura may include:  Seeing flashing lights or having blind spots.  Seeing bright spots, halos, or zigzag lines.  Having tunnel vision or blurred vision.  Having numbness or a tingling feeling.  Having trouble talking.  Having muscle weakness. Some people have symptoms after a migraine headache (postdromal phase), such as:  Feeling tired.  Difficulty concentrating. How is this diagnosed? A migraine headache can be diagnosed based on:  Your symptoms.  A physical exam.  Tests, such as: ? CT scan or an MRI of the head. These imaging tests can help rule out other causes of headaches. ? Taking fluid from the spine (lumbar puncture) and analyzing it (cerebrospinal fluid analysis, or CSF analysis). How is this treated? This condition may be treated with medicines that:  Relieve pain.  Relieve nausea.  Prevent migraine headaches. Treatment for this condition may also include:  Acupuncture.  Lifestyle changes like avoiding foods that trigger migraine headaches.  Biofeedback.  Cognitive behavioral therapy. Follow these instructions at home: Medicines  Take over-the-counter and prescription medicines only as told by your health care provider.  Ask your health care provider if the medicine prescribed to you: ? Requires you to avoid driving or using heavy machinery. ? Can cause constipation. You may need to take these actions to prevent or treat constipation:  Drink enough fluid to keep your urine pale yellow.  Take over-the-counter or prescription medicines.  Eat foods that are high in fiber, such as beans, whole grains, and   fresh fruits and vegetables.  Limit foods that are high in fat and processed sugars, such as fried or sweet foods. Lifestyle  Do not drink alcohol.  Do not  use any products that contain nicotine or tobacco, such as cigarettes, e-cigarettes, and chewing tobacco. If you need help quitting, ask your health care provider.  Get at least 8 hours of sleep every night.  Find ways to manage stress, such as meditation, deep breathing, or yoga. General instructions  Keep a journal to find out what may trigger your migraine headaches. For example, write down: ? What you eat and drink. ? How much sleep you get. ? Any change to your diet or medicines.  If you have a migraine headache: ? Avoid things that make your symptoms worse, such as bright lights. ? It may help to lie down in a dark, quiet room. ? Do not drive or use heavy machinery. ? Ask your health care provider what activities are safe for you while you are experiencing symptoms.  Keep all follow-up visits as told by your health care provider. This is important.      Contact a health care provider if:  You develop symptoms that are different or more severe than your usual migraine headache symptoms.  You have more than 15 headache days in one month. Get help right away if:  Your migraine headache becomes severe.  Your migraine headache lasts longer than 72 hours.  You have a fever.  You have a stiff neck.  You have vision loss.  Your muscles feel weak or like you cannot control them.  You start to lose your balance often.  You have trouble walking.  You faint.  You have a seizure. Summary  A migraine headache is an intense, throbbing pain on one side or both sides of the head. Migraines may also cause other symptoms, such as nausea, vomiting, and sensitivity to light and noise.  This condition may be treated with medicines and lifestyle changes. You may also need to avoid certain things that trigger a migraine headache.  Keep a journal to find out what may trigger your migraine headaches.  Contact your health care provider if you have more than 15 headache days in a  month or you develop symptoms that are different or more severe than your usual migraine headache symptoms. This information is not intended to replace advice given to you by your health care provider. Make sure you discuss any questions you have with your health care provider. Document Revised: 02/23/2019 Document Reviewed: 12/14/2018 Elsevier Patient Education  2021 [employer].  Topiramate tablets What is this medicine? TOPIRAMATE (toe PYRE a mate) is used to treat seizures in adults or children with epilepsy. It is also used for the prevention of migraine headaches. This medicine may be used for other purposes; ask your health care provider or pharmacist if you have questions. COMMON BRAND NAME(S): Topamax, Topiragen What should I tell my health care provider before I take this medicine? They need to know if you have any of these conditions:  bleeding disorder  kidney disease  lung disease  suicidal thoughts, plans, or attempt  an unusual or allergic reaction to topiramate, other medicines, foods, dyes, or preservatives  pregnant or trying to get pregnant  breast-feeding How should I use this medicine? Take this medicine by mouth with a glass of water. Follow the directions on the prescription label. Do not cut, crush or chew this medicine. Swallow the tablets whole. You can take  it with or without food. If it upsets your stomach, take it with food. Take your medicine at regular intervals. Do not take it more often than directed. Do not stop taking except on your doctor's advice. A special MedGuide will be given to you by the pharmacist with each prescription and refill. Be sure to read this information carefully each time. Talk to your pediatrician regarding the use of this medicine in children. While this drug may be prescribed for children as young as 58 years of age for selected conditions, precautions do apply. Overdosage: If you think you have taken too much of this medicine  contact a poison control center or emergency room at once. NOTE: This medicine is only for you. Do not share this medicine with others. What if I miss a dose? If you miss a dose, take it as soon as you can. If your next dose is to be taken in less than 6 hours, then do not take the missed dose. Take the next dose at your regular time. Do not take double or extra doses. What may interact with this medicine? This medicine may interact with the following medications:  acetazolamide  alcohol  antihistamines for allergy, cough, and cold  aspirin and aspirin-like medicines  atropine  birth control pills  certain medicines for anxiety or sleep  certain medicines for bladder problems like oxybutynin, tolterodine  certain medicines for depression like amitriptyline, fluoxetine, sertraline  certain medicines for seizures like carbamazepine, phenobarbital, phenytoin, primidone, valproic acid, zonisamide  certain medicines for stomach problems like dicyclomine, hyoscyamine  certain medicines for travel sickness like scopolamine  certain medicines for Parkinson's disease like benztropine, trihexyphenidyl  certain medicines that treat or prevent blood clots like warfarin, enoxaparin, dalteparin, apixaban, dabigatran, and rivaroxaban  digoxin  general anesthetics like halothane, isoflurane, methoxyflurane, propofol  hydrochlorothiazide  ipratropium  lithium  medicines that relax muscles for surgery  metformin  narcotic medicines for pain  NSAIDs, medicines for pain and inflammation, like ibuprofen or naproxen  phenothiazines like chlorpromazine, mesoridazine, prochlorperazine, thioridazine  pioglitazone This list may not describe all possible interactions. Give your health care provider a list of all the medicines, herbs, non-prescription drugs, or dietary supplements you use. Also tell them if you smoke, drink alcohol, or use illegal drugs. Some items may interact with your  medicine. What should I watch for while using this medicine? Visit your doctor or health care professional for regular checks on your progress. Tell your health care professional if your symptoms do not start to get better or if they get worse. Do not stop taking except on your health care professional's advice. You may develop a severe reaction. Your health care professional will tell you how much medicine to take. Wear a medical ID bracelet or chain. Carry a card that describes your disease and details of your medicine and dosage times. This medicine can reduce the response of your body to heat or cold. Dress warm in cold weather and stay hydrated in hot weather. If possible, avoid extreme temperatures like saunas, hot tubs, very hot or cold showers, or activities that can cause dehydration such as vigorous exercise. Check with your health care professional if you have severe diarrhea, nausea, and vomiting, or if you sweat a lot. The loss of too much body fluid may make it dangerous for you to take this medicine. You may get drowsy or dizzy. Do not drive, use machinery, or do anything that needs mental alertness until you know how this medicine  affects you. Do not stand up or sit up quickly, especially if you are an older patient. This reduces the risk of dizzy or fainting spells. Alcohol may interfere with the effect of this medicine. Avoid alcoholic drinks. Tell your health care professional right away if you have any change in your eyesight. Patients and their families should watch out for new or worsening depression or thoughts of suicide. Also watch out for sudden changes in feelings such as feeling anxious, agitated, panicky, irritable, hostile, aggressive, impulsive, severely restless, overly excited and hyperactive, or not being able to sleep. If this happens, especially at the beginning of treatment or after a change in dose, call your healthcare professional. This medicine may cause serious  skin reactions. They can happen weeks to months after starting the medicine. Contact your health care provider right away if you notice fevers or flu-like symptoms with a rash. The rash may be red or purple and then turn into blisters or peeling of the skin. Or, you might notice a red rash with swelling of the face, lips or lymph nodes in your neck or under your arms. Birth control may not work properly while you are taking this medicine. Talk to your health care professional about using an extra method of birth control. Women should inform their health care professional if they wish to become pregnant or think they might be pregnant. There is a potential for serious side effects and harm to an unborn child. Talk to your health care professional for more information. What side effects may I notice from receiving this medicine? Side effects that you should report to your doctor or health care professional as soon as possible:  allergic reactions like skin rash, itching or hives, swelling of the face, lips, or tongue  blood in the urine  changes in vision  confusion  loss of memory  pain in lower back or side  pain when urinating  redness, blistering, peeling or loosening of the skin, including inside the mouth  signs and symptoms of bleeding such as bloody or black, tarry stools; red or dark brown urine; spitting up blood or brown material that looks like coffee grounds; red spots on the skin; unusual bruising or bleeding from the eyes, gums, or nose  signs and symptoms of increased acid in the body like breathing fast; fast heartbeat; headache; confusion; unusually weak or tired; nausea, vomiting  suicidal thoughts, mood changes  trouble speaking or understanding  unusual sweating  unusually weak or tired Side effects that usually do not require medical attention (report to your doctor or health care professional if they continue or are  bothersome):  dizziness  drowsiness  fever  loss of appetite  nausea, vomiting  pain, tingling, numbness in the hands or feet  stomach pain  tiredness  upset stomach This list may not describe all possible side effects. Call your doctor for medical advice about side effects. You may report side effects to FDA at 1-800-FDA-1088. Where should I keep my medicine? Keep out of the reach of children and pets. Store between 15 and 30 degrees C (59 and 86 degrees F). Protect from moisture. Keep the container tightly closed. Get rid of any unused medicine after the expiration date. To get rid of medicines that are no longer needed or have expired:  Take the medicine to a medicine take-back program. Check with your pharmacy or law enforcement to find a location.  If you cannot return the medicine, check the label or package insert  to see if the medicine should be thrown out in the garbage or flushed down the toilet. If you are not sure, ask your health care provider. If it is safe to put it in the trash, empty the medicine out of the container. Mix the medicine with cat litter, dirt, coffee grounds, or other unwanted substance. Seal the mixture in a bag or container. Put it in the trash. NOTE: This sheet is a summary. It may not cover all possible information. If you have questions about this medicine, talk to your doctor, pharmacist, or health care provider.  2021 Elsevier/Gold Standard (2020-05-15 15:41:57)  Sumatriptan tablets What is this medicine? SUMATRIPTAN (soo ma TRIP tan) is used to treat migraines with or without aura. An aura is a strange feeling or visual disturbance that warns you of an attack. It is not used to prevent migraines. This medicine may be used for other purposes; ask your health care provider or pharmacist if you have questions. COMMON BRAND NAME(S): Imitrex, Migraine Pack What should I tell my health care provider before I take this medicine? They need to know  if you have any of these conditions:  cigarette smoker  circulation problems in fingers and toes  diabetes  heart disease  high blood pressure  high cholesterol  history of irregular heartbeat  history of stroke  kidney disease  liver disease  stomach or intestine problems  an unusual or allergic reaction to sumatriptan, other medicines, foods, dyes, or preservatives  pregnant or trying to get pregnant  breast-feeding How should I use this medicine? Take this medicine by mouth with a glass of water. Follow the directions on the prescription label. Do not take it more often than directed. Talk to your pediatrician regarding the use of this medicine in children. Special care may be needed. Overdosage: If you think you have taken too much of this medicine contact a poison control center or emergency room at once. NOTE: This medicine is only for you. Do not share this medicine with others. What if I miss a dose? This does not apply. This medicine is not for regular use. What may interact with this medicine? Do not take this medicine with any of the following medicines:  certain medicines for migraine headache like almotriptan, eletriptan, frovatriptan, naratriptan, rizatriptan, sumatriptan, zolmitriptan  ergot alkaloids like dihydroergotamine, ergonovine, ergotamine, methylergonovine  MAOIs like Carbex, Eldepryl, Marplan, Nardil, and Parnate This medicine may also interact with the following medications:  certain medicines for depression, anxiety, or psychotic disorders This list may not describe all possible interactions. Give your health care provider a list of all the medicines, herbs, non-prescription drugs, or dietary supplements you use. Also tell them if you smoke, drink alcohol, or use illegal drugs. Some items may interact with your medicine. What should I watch for while using this medicine? Visit your healthcare professional for regular checks on your progress.  Tell your healthcare professional if your symptoms do not start to get better or if they get worse. You may get drowsy or dizzy. Do not drive, use machinery, or do anything that needs mental alertness until you know how this medicine affects you. Do not stand up or sit up quickly, especially if you are an older patient. This reduces the risk of dizzy or fainting spells. Alcohol may interfere with the effect of this medicine. Tell your healthcare professional right away if you have any change in your eyesight. If you take migraine medicines for 10 or more days a month, your  migraines may get worse. Keep a diary of headache days and medicine use. Contact your healthcare professional if your migraine attacks occur more frequently. What side effects may I notice from receiving this medicine? Side effects that you should report to your doctor or health care professional as soon as possible:  allergic reactions like skin rash, itching or hives, swelling of the face, lips, or tongue  changes in vision  chest pain or chest tightness  signs and symptoms of a dangerous change in heartbeat or heart rhythm like chest pain; dizziness; fast, irregular heartbeat; palpitations; feeling faint or lightheaded; falls; breathing problems  signs and symptoms of a stroke like changes in vision; confusion; trouble speaking or understanding; severe headaches; sudden numbness or weakness of the face, arm or leg; trouble walking; dizziness; loss of balance or coordination  signs and symptoms of serotonin syndrome like irritable; confusion; diarrhea; fast or irregular heartbeat; muscle twitching; stiff muscles; trouble walking; sweating; high fever; seizures; chills; vomiting Side effects that usually do not require medical attention (report to your doctor or health care professional if they continue or are bothersome):  diarrhea  dizziness  drowsiness  dry mouth  headache  nausea, vomiting  pain, tingling,  numbness in the hands or feet  stomach pain This list may not describe all possible side effects. Call your doctor for medical advice about side effects. You may report side effects to FDA at 1-800-FDA-1088. Where should I keep my medicine? Keep out of the reach of children. Store at room temperature between 2 and 30 degrees C (36 and 86 degrees F). Throw away any unused medicine after the expiration date. NOTE: This sheet is a summary. It may not cover all possible information. If you have questions about this medicine, talk to your doctor, pharmacist, or health care provider.  2021 Elsevier/Gold Standard (2018-05-16 15:05:37)

## 2020-12-31 NOTE — Progress Notes (Signed)
MyChart Video Visit    Virtual Visit via Video Note   This visit type was conducted due to national recommendations for restrictions regarding the COVID-19 Pandemic (e.g. social distancing) in an effort to limit this patient's exposure and mitigate transmission in our community. This patient is at least at moderate risk for complications without adequate follow up. This format is felt to be most appropriate for this patient at this time. Physical exam was limited by quality of the video and audio technology used for the visit.   Patient location: Home Provider location: [facility]  I discussed the limitations of evaluation and management by telemedicine and the availability of in person appointments. The patient expressed understanding and agreed to proceed.  Patient: DIEGO DELANCEY   DOB: 01-10-1991   30 y.o. Female  MRN: [id number] Visit Date: 12/31/2020  Today's healthcare provider: Mar Daring, PA-C   Chief Complaint  Patient presents with  . Headache  I,Porsha C McClurkin,acting as a scribe for [employer], PA-C.,have documented all relevant documentation on the behalf of Mar Daring, PA-C,as directed by  Mar Daring, PA-C while in the presence of Mar Daring, PA-C.  Subjective    Headache  This is a recurrent problem. The current episode started more than 1 month ago. The problem occurs daily. The problem has been gradually worsening. The pain is located in the frontal region. The pain radiates to the left neck and right neck. The quality of the pain is described as stabbing (pressure). The pain is at a severity of 8/10. The pain is moderate. Associated symptoms include blurred vision, dizziness, nausea, a visual change and vomiting. The symptoms are aggravated by bright light and noise. She has tried darkened room (BC powder) for the symptoms. The treatment provided moderate relief. Her past medical history is significant for migraine headaches.      Medications: Outpatient Medications Prior to Visit  Medication Sig  . amphetamine-dextroamphetamine (ADDERALL XR) 30 MG 24 hr capsule Take 1 capsule (30 mg total) by mouth every morning.  . butalbital-acetaminophen-caffeine (FIORICET) 50-325-40 MG tablet Take 1-2 tablets by mouth every 6 (six) hours as needed for headache. (Patient not taking: Reported on 12/31/2020)  . phentermine (ADIPEX-P) 37.5 MG tablet Take 1 tablet (37.5 mg total) by mouth daily before breakfast. (Patient not taking: Reported on 12/31/2020)  . venlafaxine XR (EFFEXOR XR) 37.5 MG 24 hr capsule Take one capsule daily x 1 week, then take 1 capsule every other day until completed (Patient not taking: Reported on 12/31/2020)   No facility-administered medications prior to visit.    Review of Systems  Constitutional: Negative.   Eyes: Positive for blurred vision.  Respiratory: Negative.   Cardiovascular: Negative.   Gastrointestinal: Positive for nausea and vomiting.  Neurological: Positive for dizziness and headaches.    Last CBC Lab Results  Component Value Date   WBC 6.4 04/15/2020   HGB 15.6 (H) 04/15/2020   HCT 44.4 04/15/2020   MCV 89.5 04/15/2020   MCH 31.5 04/15/2020   RDW 12.8 04/15/2020   PLT 218 11/17/7251   Last metabolic panel Lab Results  Component Value Date   GLUCOSE 92 04/15/2020   NA 136 04/15/2020   K 4.2 04/15/2020   CL 102 04/15/2020   CO2 25 04/15/2020   BUN 14 04/15/2020   CREATININE 0.80 04/15/2020   GFRNONAA >60 04/15/2020   GFRAA >60 04/15/2020   CALCIUM 9.3 04/15/2020   PROT 7.7 04/15/2020   ALBUMIN 4.6  04/15/2020   LABGLOB 2.4 10/23/2018   AGRATIO 1.8 10/23/2018   BILITOT 1.1 04/15/2020   ALKPHOS 57 04/15/2020   AST 32 04/15/2020   ALT 24 04/15/2020   ANIONGAP 9 04/15/2020      Objective    There were no vitals taken for this visit. BP Readings from Last 3 Encounters:  04/15/20 130/70  07/19/19 107/76  02/09/19 124/84   Wt Readings from Last 3 Encounters:   04/15/20 192 lb (87.1 kg)  07/19/19 216 lb (98 kg)  02/09/19 211 lb 3.2 oz (95.8 kg)      Physical Exam Vitals reviewed.  Constitutional:      General: She is not in acute distress.    Appearance: She is well-developed and well-nourished. She is not ill-appearing.  HENT:     Head: Normocephalic and atraumatic.  Eyes:     Extraocular Movements: EOM normal.  Pulmonary:     Effort: Pulmonary effort is normal. No respiratory distress.  Musculoskeletal:     Cervical back: Normal range of motion and neck supple.  Neurological:     Mental Status: She is alert.  Psychiatric:        Mood and Affect: Mood and affect and mood normal.        Speech: Speech normal.        Behavior: Behavior normal.        Thought Content: Thought content normal.        Judgment: Judgment normal.       Assessment & Plan     1. Migraine with aura and with status migrainosus, not intractable Worsening with daily migraine. Will add topiramate as below for preventative treatment. Will add Imitrex for abortive treatment. Referral to Neurology placed as below. Call if worsening. F/U in 3-4 weeks.  - Ambulatory referral to Neurology - topiramate (TOPAMAX) 25 MG tablet; Take 1 tablet (25 mg total) by mouth 2 (two) times daily.  Dispense: 60 tablet; Refill: 1 - SUMAtriptan (IMITREX) 25 MG tablet; Take one tablet at onset of a migraine. May repeat in 2 hours if headache persists or recurs. No more than 200mg  in a 24 hr period  Dispense: 10 tablet; Refill: 5  2. Attention deficit hyperactivity disorder (ADHD), predominantly inattentive type Stable. Continue current dose as needed.   No follow-ups on file.     I discussed the assessment and treatment plan with the patient. The patient was provided an opportunity to ask questions and all were answered. The patient agreed with the plan and demonstrated an understanding of the instructions.   The patient was advised to call back or seek an in-person evaluation  if the symptoms worsen or if the condition fails to improve as anticipated.  I provided 17 minutes of face-to-face time during this encounter via MyChart Video enabled encounter.  Reynolds Bowl, PA-C, have reviewed all documentation for this visit. The documentation on 12/31/20 for the exam, diagnosis, procedures, and orders are all accurate and complete.  [place] [facility] [phone] (phone) [phone] (fax)  [place]

## 2021-01-01 ENCOUNTER — Other Ambulatory Visit: Payer: Self-pay | Admitting: Physician Assistant

## 2021-01-01 DIAGNOSIS — F900 Attention-deficit hyperactivity disorder, predominantly inattentive type: Secondary | ICD-10-CM

## 2021-01-01 MED ORDER — AMPHETAMINE-DEXTROAMPHET ER 30 MG PO CP24: 30.0000 mg | ORAL_CAPSULE | ORAL | 0 refills | Status: DC

## 2021-01-27 ENCOUNTER — Telehealth (INDEPENDENT_AMBULATORY_CARE_PROVIDER_SITE_OTHER): Payer: BC Managed Care – PPO | Admitting: Physician Assistant

## 2021-01-27 DIAGNOSIS — U071 COVID-19: Secondary | ICD-10-CM

## 2021-01-27 MED ORDER — PROMETHAZINE-DM 6.25-15 MG/5ML PO SYRP: 5.0000 mL | ORAL_SOLUTION | Freq: Four times a day (QID) | ORAL | 0 refills | Status: DC | PRN

## 2021-01-27 NOTE — Progress Notes (Signed)
MyChart Video Visit    Virtual Visit via Video Note   This visit type was conducted due to national recommendations for restrictions regarding the COVID-19 Pandemic (e.g. social distancing) in an effort to limit this patient's exposure and mitigate transmission in our community. This patient is at least at moderate risk for complications without adequate follow up. This format is felt to be most appropriate for this patient at this time. Physical exam was limited by quality of the video and audio technology used for the visit.   Patient location: home Provider location: office  I discussed the limitations of evaluation and management by telemedicine and the availability of in person appointments. The patient expressed understanding and agreed to proceed.  Patient: Cynthia Glover   DOB: 01-Feb-1991   30 y.o. Female  MRN: [id number] Visit Date: 01/27/2021  Today's healthcare provider: Trinna Post, PA-C   Chief Complaint  Patient presents with  . Covid Positive   Subjective    HPI  Patient is a 31 year old female who presents via video visit.  She states that 2 days ago she began having body aches and nausea.  She did an at home Covid testes yesterday which was positive.  She has had fever as high as 102 for which she is taking tylenol.  She has also been taking Mucinex.   She denies having any cough or shortness of breath.  She states he body aches are severe enough that they are keeping her up at night.      Medications: Outpatient Medications Prior to Visit  Medication Sig  . amphetamine-dextroamphetamine (ADDERALL XR) 30 MG 24 hr capsule Take 1 capsule (30 mg total) by mouth every morning.  . SUMAtriptan (IMITREX) 25 MG tablet Take one tablet at onset of a migraine. May repeat in 2 hours if headache persists or recurs. No more than 200mg  in a 24 hr period  . topiramate (TOPAMAX) 25 MG tablet Take 1 tablet (25 mg total) by mouth 2 (two) times daily.   No  facility-administered medications prior to visit.    Review of Systems  Constitutional: Positive for chills, diaphoresis, fatigue and fever.  HENT: Positive for congestion (some head) and sinus pressure. Negative for ear discharge, ear pain, facial swelling, postnasal drip, rhinorrhea, sinus pain, sneezing, sore throat, tinnitus and trouble swallowing.   Respiratory: Negative for cough, chest tightness, shortness of breath and wheezing.   Cardiovascular: Negative for chest pain.  Gastrointestinal: Positive for diarrhea, nausea and vomiting. Negative for abdominal pain and anal bleeding.  Musculoskeletal: Positive for myalgias.  Neurological: Positive for headaches. Negative for dizziness.      Objective    There were no vitals taken for this visit.   Physical Exam Constitutional:      Appearance: Normal appearance.  Pulmonary:     Effort: Pulmonary effort is normal. No respiratory distress.  Neurological:     Mental Status: She is alert.  Psychiatric:        Mood and Affect: Mood normal.        Behavior: Behavior normal.        Assessment & Plan    1. COVID-19  Your COVID test is positive. You should remain isolated and quarantine for at least 5 days from start of symptoms. You must be feeling better and be fever free without any fever reducers for at least 24 hours as well. You should wear a mask at all times when out of your home or around  others for 5 days after leaving isolation.  Your household contacts should be tested as well as work contacts. If you feel worse or have increasing shortness of breath, you should be seen in person at urgent care or the emergency room.   - promethazine-dextromethorphan (PROMETHAZINE-DM) 6.25-15 MG/5ML syrup; Take 5 mLs by mouth 4 (four) times daily as needed.  Dispense: 118 mL; Refill: 0   Return if symptoms worsen or fail to improve.     I discussed the assessment and treatment plan with the patient. The patient was provided an  opportunity to ask questions and all were answered. The patient agreed with the plan and demonstrated an understanding of the instructions.   The patient was advised to call back or seek an in-person evaluation if the symptoms worsen or if the condition fails to improve as anticipated.   ITrinna Post, PA-C, have reviewed all documentation for this visit. The documentation on 01/28/21 for the exam, diagnosis, procedures, and orders are all accurate and complete.  The entirety of the information documented in the History of Present Illness, Review of Systems and Physical Exam were personally obtained by me. Portions of this information were initially documented by [facility] and reviewed by me for thoroughness and accuracy.    Paulene Floor [facility] [phone] (phone) [phone] (fax)  [place]

## 2021-01-27 NOTE — Patient Instructions (Signed)
COVID-19 Quarantine vs. Isolation QUARANTINE keeps someone who was in close contact with someone who has COVID-19 away from others. Quarantine if you have been in close contact with someone who has COVID-19, unless you have been fully vaccinated. If you are fully vaccinated  You do NOT need to quarantine unless they have symptoms  Get tested 3-5 days after your exposure, even if you don't have symptoms  Wear a mask indoors in public for 14 days following exposure or until your test result is negative If you are not fully vaccinated  Stay home for 14 days after your last contact with a person who has COVID-19  Watch for fever (100.4F), cough, shortness of breath, or other symptoms of COVID-19  If possible, stay away from people you live with, especially people who are at higher risk for getting very sick from COVID-19  Contact your local public health department for options in your area to possibly shorten your quarantine ISOLATION keeps someone who is sick or tested positive for COVID-19 without symptoms away from others, even in their own home. People who are in isolation should stay home and stay in a specific "sick room" or area and use a separate bathroom (if available). If you are sick and think or know you have COVID-19 Stay home until after  At least 10 days since symptoms first appeared and  At least 24 hours with no fever without the use of fever-reducing medications and  Symptoms have improved If you tested positive for COVID-19 but do not have symptoms  Stay home until after 10 days have passed since your positive viral test  If you develop symptoms after testing positive, follow the steps above for those who are sick cdc.gov/coronavirus 08/11/2020 This information is not intended to replace advice given to you by your health care provider. Make sure you discuss any questions you have with your health care provider. Document Revised: 09/15/2020 Document Reviewed:  09/15/2020 Elsevier Patient Education  2021 Elsevier Inc.  

## 2021-02-03 ENCOUNTER — Other Ambulatory Visit: Payer: Self-pay | Admitting: Physician Assistant

## 2021-02-03 DIAGNOSIS — F900 Attention-deficit hyperactivity disorder, predominantly inattentive type: Secondary | ICD-10-CM

## 2021-02-03 MED ORDER — AMPHETAMINE-DEXTROAMPHET ER 30 MG PO CP24: 30.0000 mg | ORAL_CAPSULE | ORAL | 0 refills | Status: DC

## 2021-03-05 ENCOUNTER — Other Ambulatory Visit: Payer: Self-pay | Admitting: Physician Assistant

## 2021-03-05 DIAGNOSIS — F900 Attention-deficit hyperactivity disorder, predominantly inattentive type: Secondary | ICD-10-CM

## 2021-03-05 MED ORDER — AMPHETAMINE-DEXTROAMPHET ER 30 MG PO CP24: 30.0000 mg | ORAL_CAPSULE | ORAL | 0 refills | Status: DC

## 2021-03-31 ENCOUNTER — Ambulatory Visit: Payer: BC Managed Care – PPO | Admitting: Family Medicine

## 2021-04-08 ENCOUNTER — Other Ambulatory Visit: Payer: Self-pay | Admitting: Family Medicine

## 2021-04-08 DIAGNOSIS — F900 Attention-deficit hyperactivity disorder, predominantly inattentive type: Secondary | ICD-10-CM

## 2021-04-08 NOTE — Telephone Encounter (Signed)
Requested medication (s) are due for refill today: yes  Requested medication (s) are on the active medication list: yes  Last refill: 03/05/21  Future visit scheduled: yes  Notes to clinic:  not delegated    Requested Prescriptions  Pending Prescriptions Disp Refills   amphetamine-dextroamphetamine (ADDERALL XR) 30 MG 24 hr capsule 30 capsule 0    Sig: Take 1 capsule (30 mg total) by mouth every morning.      Not Delegated - Psychiatry:  Stimulants/ADHD Failed - 04/08/2021  3:21 PM      Failed - This refill cannot be delegated      Failed - Urine Drug Screen completed in last 360 days      Passed - Valid encounter within last 3 months    Recent Outpatient Visits           2 months ago [place], [place], PA-C   3 months ago Migraine with aura and with status migrainosus, not intractable   [place], [place]   1 year ago Attention deficit hyperactivity disorder (ADHD), predominantly inattentive type   [facility], Clearnce Sorrel, [place]   2 years ago Encounter for IUD removal   [facility] [place], Dionne Bucy, MD   2 years ago Attention deficit hyperactivity disorder (ADHD), predominantly inattentive type   [place], [place]       Future Appointments             In 1 week [place], Vickki Muff, PA-C [facility], [place]

## 2021-04-08 NOTE — Telephone Encounter (Signed)
Medication Refill - Medication:   amphetamine-dextroamphetamine (ADDERALL XR) 30 MG 24 hr capsule  Has the patient contacted their pharmacy? Need doctor approval. Pt has future appt on 6/6.   Preferred Pharmacy (with phone number or street name):   [facility], [place] - [address] [place] [address]  Phone: [phone] Fax: [phone]     Agent: Please be advised that RX refills may take up to 3 business days. We ask that you follow-up with your pharmacy.

## 2021-04-09 MED ORDER — AMPHETAMINE-DEXTROAMPHET ER 30 MG PO CP24: 30.0000 mg | ORAL_CAPSULE | ORAL | 0 refills | Status: DC

## 2021-04-09 NOTE — Telephone Encounter (Signed)
Please review. Pt has appt with you on 04/20/21. Ok to refill? Former [employer] pt.

## 2021-04-20 ENCOUNTER — Ambulatory Visit: Payer: BC Managed Care – PPO | Admitting: Family Medicine

## 2021-05-01 ENCOUNTER — Ambulatory Visit: Payer: BC Managed Care – PPO | Admitting: Family Medicine

## 2021-05-04 ENCOUNTER — Encounter: Payer: Self-pay | Admitting: Family Medicine

## 2021-05-04 ENCOUNTER — Ambulatory Visit: Payer: BC Managed Care – PPO | Admitting: Family Medicine

## 2021-05-04 ENCOUNTER — Other Ambulatory Visit: Payer: Self-pay

## 2021-05-04 VITALS — BP 102/78 | HR 73 | Wt 191.0 lb

## 2021-05-04 DIAGNOSIS — Z0289 Encounter for other administrative examinations: Secondary | ICD-10-CM | POA: Diagnosis not present

## 2021-05-04 DIAGNOSIS — F900 Attention-deficit hyperactivity disorder, predominantly inattentive type: Secondary | ICD-10-CM | POA: Diagnosis not present

## 2021-05-04 DIAGNOSIS — Z111 Encounter for screening for respiratory tuberculosis: Secondary | ICD-10-CM | POA: Diagnosis not present

## 2021-05-04 NOTE — Progress Notes (Signed)
Established patient visit   Patient: Cynthia Glover   DOB: 24-Mar-1991   29 y.o. Female  MRN: [id number] Visit Date: 05/04/2021  Today's healthcare provider: Vernie Murders, PA-C   No chief complaint on file.  Subjective    HPI  Patient is a 30 year old female who presents to have an employment form filled out.  She is currently working for the [employer].  She is up to date on her immunizations but has not had TB testing in over a year.     Past Medical History:  Diagnosis Date   Anxiety    Depression    No past surgical history on file.  Family History  Problem Relation Age of Onset   Depression Father    Diabetes Father    Depression Brother    Anxiety disorder Brother    Social History   Tobacco Use   Smoking status: Former    Pack years: 0.00   Smokeless tobacco: Never   Tobacco comments:    Quit in 2015. Smoked for 5 years.  Substance Use Topics   Alcohol use: No   Drug use: No   Allergies  Allergen Reactions   Penicillins Rash   Medications: Outpatient Medications Prior to Visit  Medication Sig   amphetamine-dextroamphetamine (ADDERALL XR) 30 MG 24 hr capsule Take 1 capsule (30 mg total) by mouth every morning.   promethazine-dextromethorphan (PROMETHAZINE-DM) 6.25-15 MG/5ML syrup Take 5 mLs by mouth 4 (four) times daily as needed.   SUMAtriptan (IMITREX) 25 MG tablet Take one tablet at onset of a migraine. May repeat in 2 hours if headache persists or recurs. No more than 200mg  in a 24 hr period   topiramate (TOPAMAX) 25 MG tablet Take 1 tablet (25 mg total) by mouth 2 (two) times daily.   No facility-administered medications prior to visit.    Review of Systems  All other systems reviewed and are negative.      Objective    BP 102/78 (BP Location: Right Arm, Patient Position: Sitting, Cuff Size: Normal)   Pulse 73   Wt 191 lb (86.6 kg)   SpO2 98%   BMI 29.04 kg/m    Physical Exam Constitutional:      General: She is not  in acute distress.    Appearance: She is well-developed.  HENT:     Head: Normocephalic and atraumatic.     Right Ear: Hearing and tympanic membrane normal.     Left Ear: Hearing and tympanic membrane normal.     Nose: Nose normal.     Mouth/Throat:     Pharynx: Oropharynx is clear.  Eyes:     General: Lids are normal. No scleral icterus.       Right eye: No discharge.        Left eye: No discharge.     Conjunctiva/sclera: Conjunctivae normal.  Cardiovascular:     Rate and Rhythm: Normal rate and regular rhythm.     Pulses: Normal pulses.     Heart sounds: Normal heart sounds.  Pulmonary:     Effort: Pulmonary effort is normal. No respiratory distress.     Breath sounds: Normal breath sounds.  Abdominal:     General: Bowel sounds are normal.     Palpations: Abdomen is soft.  Musculoskeletal:        General: Normal range of motion.     Cervical back: Normal range of motion and neck supple.  Skin:  Findings: No lesion or rash.  Neurological:     Mental Status: She is alert and oriented to person, place, and time.  Psychiatric:        Speech: Speech normal.        Behavior: Behavior normal.        Thought Content: Thought content normal.     No results found for any visits on 05/04/21.  Assessment & Plan     1. Encounter for physical examination related to employment Good general health. 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 Awaiting TB test results to complete form.  2. Screening for tuberculosis Asymptomatic. - QuantiFERON-TB Gold Plus   No follow-ups on file.      I, Jeanee Fabre, PA-C, have reviewed all documentation for this visit. The documentation on 05/04/21 for the exam, diagnosis, procedures, and orders are all accurate and complete.    Vernie Murders, PA-C  [facility] [phone] (phone) [phone] (fax)  [place]

## 2021-05-10 ENCOUNTER — Other Ambulatory Visit: Payer: Self-pay | Admitting: Family Medicine

## 2021-05-10 DIAGNOSIS — F900 Attention-deficit hyperactivity disorder, predominantly inattentive type: Secondary | ICD-10-CM

## 2021-05-11 NOTE — Telephone Encounter (Signed)
Will forward to [place]. Note is not closed, so unclear if she followed up for ADHD last month at CPE with him

## 2021-05-13 MED ORDER — AMPHETAMINE-DEXTROAMPHET ER 30 MG PO CP24: 30.0000 mg | ORAL_CAPSULE | ORAL | 0 refills | Status: DC

## 2021-05-14 LAB — QUANTIFERON-TB GOLD PLUS
QuantiFERON Mitogen Value: >10 IU/mL
QuantiFERON Nil Value: 0.02 IU/mL
QuantiFERON TB1 Ag Value: 0.1 IU/mL
QuantiFERON TB2 Ag Value: 0.18 IU/mL
QuantiFERON-TB Gold Plus: NEGATIVE

## 2021-06-18 ENCOUNTER — Other Ambulatory Visit: Payer: Self-pay | Admitting: Family Medicine

## 2021-06-18 DIAGNOSIS — F900 Attention-deficit hyperactivity disorder, predominantly inattentive type: Secondary | ICD-10-CM

## 2021-06-22 MED ORDER — AMPHETAMINE-DEXTROAMPHET ER 30 MG PO CP24
30.0000 mg | ORAL_CAPSULE | ORAL | 0 refills | Status: DC
Start: 2021-06-22 — End: 2021-07-22

## 2021-07-22 ENCOUNTER — Other Ambulatory Visit: Payer: Self-pay | Admitting: Family Medicine

## 2021-07-22 DIAGNOSIS — F900 Attention-deficit hyperactivity disorder, predominantly inattentive type: Secondary | ICD-10-CM

## 2021-07-23 MED ORDER — AMPHETAMINE-DEXTROAMPHET ER 30 MG PO CP24: 30.0000 mg | ORAL_CAPSULE | ORAL | 0 refills | Status: DC

## 2021-08-18 ENCOUNTER — Telehealth: Payer: Self-pay

## 2021-08-18 ENCOUNTER — Telehealth: Payer: Self-pay | Admitting: Physician Assistant

## 2021-08-18 DIAGNOSIS — R7309 Other abnormal glucose: Secondary | ICD-10-CM

## 2021-08-18 DIAGNOSIS — G4452 New daily persistent headache (NDPH): Secondary | ICD-10-CM

## 2021-08-18 MED ORDER — TIZANIDINE HCL 4 MG PO TABS: 4.0000 mg | ORAL_TABLET | Freq: Four times a day (QID) | ORAL | 0 refills | Status: DC | PRN

## 2021-08-18 NOTE — Progress Notes (Signed)
Virtual Visit Consent   Cynthia Glover, you are scheduled for a virtual visit with a [facility] provider today.     Just as with appointments in the office, your consent must be obtained to participate.  Your consent will be active for this visit and any virtual visit you may have with one of our providers in the next 365 days.     If you have a MyChart account, a copy of this consent can be sent to you electronically.  All virtual visits are billed to your insurance company just like a traditional visit in the office.    As this is a virtual visit, video technology does not allow for your provider to perform a traditional examination.  This may limit your provider's ability to fully assess your condition.  If your provider identifies any concerns that need to be evaluated in person or the need to arrange testing (such as labs, EKG, etc.), we will make arrangements to do so.     Although advances in technology are sophisticated, we cannot ensure that it will always work on either your end or our end.  If the connection with a video visit is poor, the visit may have to be switched to a telephone visit.  With either a video or telephone visit, we are not always able to ensure that we have a secure connection.     I need to obtain your verbal consent now.   Are you willing to proceed with your visit today?    Cynthia Glover has provided verbal consent on 08/18/2021 for a virtual visit (video or telephone).   Leeanne Rio, [place]   Date: 08/18/2021 1:19 PM   Virtual Visit via Video Note   I, Leeanne Rio, connected with  Cynthia Glover  ([id number], 15-Feb-1991) on 08/18/21 at 12:45 PM EDT by a video-enabled telemedicine application and verified that I am speaking with the correct person using two identifiers.  Location: Patient: Virtual Visit Location Patient: Home Provider: Virtual Visit Location Provider: Home Office   I discussed the limitations of evaluation and management by  telemedicine and the availability of in person appointments. The patient expressed understanding and agreed to proceed.    History of Present Illness: Cynthia Glover is a 30 y.o. who identifies as a female who was assigned female at birth, and is being seen today for headaches and abnormal glucose.   Endorses history of migraine headache for sometime but notes the frequency of headache worsening over the past few months. Notes headaches are now daily -- frontal and occipital with photophobia and nausea. Sometimes a tightness sensation that she can feel in her neck and shoulders. Other times it more so a classic migraine for her. Denies vision change. Notes she will sometimes feel dehydrated during this and will check glucose and will average 50-60, non fasting. Does eat breakfast each morning and feels fine in the morning but symptoms start prior to lunch and last throughout midday. Today did not go to work due to headache. Is mild but thought she should stay home and speak to someone.    HPI: HPI  Problems:  Patient Active Problem List   Diagnosis Date Noted   Attention deficit hyperactivity disorder (ADHD), predominantly inattentive type 01/01/2019   Labor and delivery indication for care or intervention 04/20/2018   Migraine without aura and without status migrainosus, not intractable 01/09/2018   Situational mixed anxiety and depressive disorder 05/31/2016   Obesity 03/31/2016  BMI 32.0-32.9,adult 03/31/2016    Allergies:  Allergies  Allergen Reactions   Penicillins Rash   Medications:  Current Outpatient Medications:    tiZANidine (ZANAFLEX) 4 MG tablet, Take 1 tablet (4 mg total) by mouth every 6 (six) hours as needed (tension headache)., Disp: 30 tablet, Rfl: 0   amphetamine-dextroamphetamine (ADDERALL XR) 30 MG 24 hr capsule, Take 1 capsule (30 mg total) by mouth every morning., Disp: 30 capsule, Rfl: 0  Observations/Objective: Patient is well-developed, well-nourished in no  acute distress.  Resting comfortably at home.  Head is normocephalic, atraumatic.  No labored breathing. Speech is clear and coherent with logical content.  Patient is alert and oriented at baseline.   Assessment and Plan: 1. New daily persistent headache - tiZANidine (ZANAFLEX) 4 MG tablet; Take 1 tablet (4 mg total) by mouth every 6 (six) hours as needed (tension headache).  Dispense: 30 tablet; Refill: 0  2. Low glucose level  New daily headache in patient with history of migraines. The headaches are sometimes migrainous in nature but sometimes are more consistent with tension or generalized headache. Stress relief tactics reviewed. Will start her on a trial of Tizanidine for tension-type headaches. OTC medications reviewed. There is also concern with lower glucose levels despite not skipping meals. The glucose coincides with worsening headaches and is likely one of a few triggers. She needs further workup for this. As such she is to contact her PCP for evaluation so she can get detailed examination and workup.   Follow Up Instructions: I discussed the assessment and treatment plan with the patient. The patient was provided an opportunity to ask questions and all were answered. The patient agreed with the plan and demonstrated an understanding of the instructions.  A copy of instructions were sent to the patient via MyChart unless otherwise noted below.   The patient was advised to call back or seek an in-person evaluation if the symptoms worsen or if the condition fails to improve as anticipated.  Time:  I spent 15 minutes with the patient via telehealth technology discussing the above problems/concerns.    Leeanne Rio, PA-C

## 2021-08-18 NOTE — Patient Instructions (Signed)
  Cynthia Glover, thank you for joining Leeanne Rio, PA-C for today's virtual visit.  While this provider is not your primary care provider (PCP), if your PCP is located in our provider database this encounter information will be shared with them immediately following your visit.  Consent: (Patient) Cynthia Glover provided verbal consent for this virtual visit at the beginning of the encounter.  Current Medications:  Current Outpatient Medications:    tiZANidine (ZANAFLEX) 4 MG tablet, Take 1 tablet (4 mg total) by mouth every 6 (six) hours as needed (tension headache)., Disp: 30 tablet, Rfl: 0   amphetamine-dextroamphetamine (ADDERALL XR) 30 MG 24 hr capsule, Take 1 capsule (30 mg total) by mouth every morning., Disp: 30 capsule, Rfl: 0   Medications ordered in this encounter:  Meds ordered this encounter  Medications   tiZANidine (ZANAFLEX) 4 MG tablet    Sig: Take 1 tablet (4 mg total) by mouth every 6 (six) hours as needed (tension headache).    Dispense:  30 tablet    Refill:  0    Order Specific Question:   Supervising Provider    Answer:   Sabra Heck, BRIAN [[id number]]     *If you need refills on other medications prior to your next appointment, please contact your pharmacy*  Follow-Up: Call back or seek an in-person evaluation if the symptoms worsen or if the condition fails to improve as anticipated.  Other Instructions Please keep well-hydrated and get plenty of rest.  I would increase intake at breakfast and consider adding on a healthy mid-morning snack like an apple with peanut butter or a handful of mixed nuts, etc. Consider adding on a small low-sugar electrolyte water like G2 gatorade as well once daily. Try to be mindful of tension in the shoulders -- making sure you are sitting in a chair that is supportive, etc.  Use the tizanidine as directed for tension like headaches. As discussed you need to contact your primary provider for an appointment as you need a further  examination and workup for these issues.    If you have been instructed to have an in-person evaluation today at a local Urgent Care facility, please use the link below. It will take you to a list of all of our available [facility], including address, phone number and hours of operation. Please do not delay care.  [facility]  If you or a family member do not have a primary care provider, use the link below to schedule a visit and establish care. When you choose a [facility] primary care physician or advanced practice provider, you gain a long-term partner in health. Find a Primary Care Provider  Learn more about [facility] in-office and virtual care options: [facility]

## 2021-08-24 ENCOUNTER — Other Ambulatory Visit: Payer: Self-pay | Admitting: Family Medicine

## 2021-08-24 DIAGNOSIS — F900 Attention-deficit hyperactivity disorder, predominantly inattentive type: Secondary | ICD-10-CM

## 2021-08-26 MED ORDER — AMPHETAMINE-DEXTROAMPHET ER 30 MG PO CP24: 30.0000 mg | ORAL_CAPSULE | ORAL | 0 refills | Status: DC

## 2021-08-26 NOTE — Telephone Encounter (Signed)
Last refill: 07/23/2021 #30 with no refills  Last appt: 05/04/2021 Next appt: no scheduled

## 2021-08-31 ENCOUNTER — Other Ambulatory Visit: Payer: Self-pay | Admitting: Family Medicine

## 2021-08-31 DIAGNOSIS — F900 Attention-deficit hyperactivity disorder, predominantly inattentive type: Secondary | ICD-10-CM

## 2021-09-23 ENCOUNTER — Other Ambulatory Visit: Payer: Self-pay

## 2021-09-23 ENCOUNTER — Ambulatory Visit: Payer: BC Managed Care – PPO | Admitting: Family Medicine

## 2021-09-23 ENCOUNTER — Encounter: Payer: Self-pay | Admitting: Family Medicine

## 2021-09-23 VITALS — BP 115/70 | HR 90 | Resp 16 | Wt 189.5 lb

## 2021-09-23 DIAGNOSIS — R5383 Other fatigue: Secondary | ICD-10-CM

## 2021-09-23 DIAGNOSIS — F5104 Psychophysiologic insomnia: Secondary | ICD-10-CM

## 2021-09-23 DIAGNOSIS — F900 Attention-deficit hyperactivity disorder, predominantly inattentive type: Secondary | ICD-10-CM | POA: Diagnosis not present

## 2021-09-23 DIAGNOSIS — F410 Panic disorder [episodic paroxysmal anxiety] without agoraphobia: Secondary | ICD-10-CM | POA: Insufficient documentation

## 2021-09-23 DIAGNOSIS — N912 Amenorrhea, unspecified: Secondary | ICD-10-CM | POA: Diagnosis not present

## 2021-09-23 DIAGNOSIS — F40248 Other situational type phobia: Secondary | ICD-10-CM

## 2021-09-23 LAB — POCT URINE PREGNANCY: Preg Test, Ur: NEGATIVE

## 2021-09-23 MED ORDER — TRAZODONE HCL 50 MG PO TABS
25.0000 mg | ORAL_TABLET | Freq: Every evening | ORAL | 3 refills | Status: DC | PRN
Start: 2021-09-23 — End: 2022-02-03

## 2021-09-23 MED ORDER — AMPHETAMINE-DEXTROAMPHETAMINE 10 MG PO TABS: 10.0000 mg | ORAL_TABLET | Freq: Every day | ORAL | 0 refills | Status: DC

## 2021-09-23 MED ORDER — HYDROXYZINE HCL 10 MG PO TABS: 10.0000 mg | ORAL_TABLET | Freq: Three times a day (TID) | ORAL | 0 refills | Status: DC | PRN

## 2021-09-23 MED ORDER — PROPRANOLOL HCL 40 MG PO TABS: 40.0000 mg | ORAL_TABLET | Freq: Every day | ORAL | 0 refills | Status: DC | PRN

## 2021-09-23 MED ORDER — AMPHETAMINE-DEXTROAMPHET ER 30 MG PO CP24: 30.0000 mg | ORAL_CAPSULE | ORAL | 0 refills | Status: DC

## 2021-09-23 NOTE — Assessment & Plan Note (Signed)
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

## 2021-09-23 NOTE — Assessment & Plan Note (Signed)
POCT urine pregnancy negative

## 2021-09-23 NOTE — Assessment & Plan Note (Signed)
S/s worse since pandemic Trial of BB to assist with HR control

## 2021-09-23 NOTE — Assessment & Plan Note (Signed)
Recommend self care and balanced schedule -lots of stress as a single mom

## 2021-09-23 NOTE — Assessment & Plan Note (Signed)
Addition of regular adderall to XR daily medication to assist with afternoon slump  Denies SAD; s/s started prior to season change

## 2021-09-23 NOTE — Progress Notes (Signed)
Established patient visit   Patient: Cynthia Glover   DOB: Sep 14, 1991   30 y.o. Female  MRN: [id number] Visit Date: 09/23/2021  Today's healthcare provider: Gwyneth Sprout, FNP   Chief Complaint  Patient presents with   ADHD   Amenorrhea    Patient reports absence of menstruation and reports symptoms of headache and fatigue and believes she may be pregnant   Subjective    HPI HPI     Amenorrhea    Additional comments: Patient reports absence of menstruation and reports symptoms of headache and fatigue and believes she may be pregnant      Last edited by Minette Headland, CMA on 09/23/2021  1:18 PM.      Follow up for ADHD  The patient was last seen for this 9 months ago. Changes made at last visit include none continue Adderrall.  She reports excellent compliance with treatment. She feels that condition is Unchanged.Patient states " I feel like my body has gotten use to medication and by the end of the day I feel like im crashing".  She is not having side effects.   -----------------------------------------------------------------------------------------     Medications: Outpatient Medications Prior to Visit  Medication Sig   [DISCONTINUED] amphetamine-dextroamphetamine (ADDERALL XR) 30 MG 24 hr capsule Take 1 capsule (30 mg total) by mouth every morning.   [DISCONTINUED] tiZANidine (ZANAFLEX) 4 MG tablet Take 1 tablet (4 mg total) by mouth every 6 (six) hours as needed (tension headache). (Patient not taking: Reported on 09/23/2021)   No facility-administered medications prior to visit.    Review of Systems     Objective    BP 115/70   Pulse 90   Resp 16   Wt 189 lb 8 oz (86 kg)   LMP  (Within Months)   SpO2 98%   BMI 28.81 kg/m    Physical Exam Vitals and nursing note reviewed.  Constitutional:      General: She is not in acute distress.    Appearance: Normal appearance. She is overweight. She is not ill-appearing, toxic-appearing or  diaphoretic.  HENT:     Head: Normocephalic and atraumatic.  Cardiovascular:     Rate and Rhythm: Normal rate and regular rhythm.     Pulses: Normal pulses.     Heart sounds: Normal heart sounds. No murmur heard.   No friction rub. No gallop.  Pulmonary:     Effort: Pulmonary effort is normal. No respiratory distress.     Breath sounds: Normal breath sounds. No stridor. No wheezing, rhonchi or rales.  Chest:     Chest wall: No tenderness.  Abdominal:     General: Bowel sounds are normal.     Palpations: Abdomen is soft.  Musculoskeletal:        General: No swelling, tenderness, deformity or signs of injury. Normal range of motion.     Right lower leg: No edema.     Left lower leg: No edema.  Skin:    General: Skin is warm and dry.     Capillary Refill: Capillary refill takes less than 2 seconds.     Coloration: Skin is not jaundiced or pale.     Findings: No bruising, erythema, lesion or rash.  Neurological:     General: No focal deficit present.     Mental Status: She is alert and oriented to person, place, and time. Mental status is at baseline.     Cranial Nerves: No cranial nerve deficit.  Sensory: No sensory deficit.     Motor: No weakness.     Coordination: Coordination normal.  Psychiatric:        Mood and Affect: Mood normal.        Behavior: Behavior normal.        Thought Content: Thought content normal.        Judgment: Judgment normal.      No results found for any visits on 09/23/21.  Assessment & Plan     Problem List Items Addressed This Visit       Other   Attention deficit hyperactivity disorder (ADHD), predominantly inattentive type - Primary    Addition of regular adderall to XR daily medication to assist with afternoon slump  Denies SAD; s/s started prior to season change      Relevant Medications   amphetamine-dextroamphetamine (ADDERALL) 10 MG tablet   amphetamine-dextroamphetamine (ADDERALL XR) 30 MG 24 hr capsule   Amenorrhea    POCT  urine pregnancy negative      Relevant Orders   POCT urine pregnancy   Other fatigue    Recommend self care and balanced schedule -lots of stress as a single mom      Relevant Medications   amphetamine-dextroamphetamine (ADDERALL) 10 MG tablet   Fear of public speaking    S/s worse since pandemic Trial of BB to assist with HR control      Relevant Medications   propranolol (INDERAL) 40 MG tablet   Panic attacks    Trail of hydroxyzine to assist with as needed control 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      Vonna Kotyk, FNP, have reviewed all documentation for this visit. The documentation on 09/23/21 for the exam, diagnosis, procedures, and orders are all accurate and complete.    Gwyneth Sprout, [place] [phone] (phone) [phone] (fax)  [place]

## 2021-10-16 ENCOUNTER — Other Ambulatory Visit: Payer: Self-pay | Admitting: Family Medicine

## 2021-10-16 DIAGNOSIS — F40248 Other situational type phobia: Secondary | ICD-10-CM

## 2021-10-16 DIAGNOSIS — F900 Attention-deficit hyperactivity disorder, predominantly inattentive type: Secondary | ICD-10-CM

## 2021-10-16 DIAGNOSIS — R5383 Other fatigue: Secondary | ICD-10-CM

## 2021-10-16 DIAGNOSIS — F410 Panic disorder [episodic paroxysmal anxiety] without agoraphobia: Secondary | ICD-10-CM

## 2021-10-16 MED ORDER — AMPHETAMINE-DEXTROAMPHET ER 30 MG PO CP24: 30.0000 mg | ORAL_CAPSULE | ORAL | 0 refills | Status: DC

## 2021-10-16 MED ORDER — PROPRANOLOL HCL 40 MG PO TABS: 40.0000 mg | ORAL_TABLET | Freq: Every day | ORAL | 0 refills | Status: DC | PRN

## 2021-10-16 MED ORDER — HYDROXYZINE HCL 10 MG PO TABS: 10.0000 mg | ORAL_TABLET | Freq: Three times a day (TID) | ORAL | 0 refills | Status: DC | PRN

## 2021-10-16 NOTE — Telephone Encounter (Signed)
LOV: 09/23/2021  NOV:  None  Last Refill:     Adderall 09/23/2021 #30 0 Refills Hydroxyzine 09/23/2021 #30 0 Refills.  Propranolol 09/23/2021 #15 0 Refills.    Thanks,   -Mickel Baas

## 2021-10-19 ENCOUNTER — Encounter: Payer: Self-pay | Admitting: Family Medicine

## 2021-10-19 ENCOUNTER — Other Ambulatory Visit: Payer: Self-pay

## 2021-11-03 ENCOUNTER — Other Ambulatory Visit: Payer: Self-pay | Admitting: Family Medicine

## 2021-11-03 DIAGNOSIS — F900 Attention-deficit hyperactivity disorder, predominantly inattentive type: Secondary | ICD-10-CM

## 2021-11-03 DIAGNOSIS — R5383 Other fatigue: Secondary | ICD-10-CM

## 2021-11-03 MED ORDER — AMPHETAMINE-DEXTROAMPHETAMINE 10 MG PO TABS: 10.0000 mg | ORAL_TABLET | Freq: Every day | ORAL | 0 refills | Status: DC

## 2021-11-03 MED ORDER — AMPHETAMINE-DEXTROAMPHET ER 30 MG PO CP24: 30.0000 mg | ORAL_CAPSULE | ORAL | 0 refills | Status: DC

## 2021-12-07 ENCOUNTER — Other Ambulatory Visit: Payer: Self-pay | Admitting: Family Medicine

## 2021-12-07 DIAGNOSIS — F900 Attention-deficit hyperactivity disorder, predominantly inattentive type: Secondary | ICD-10-CM

## 2021-12-07 MED ORDER — AMPHETAMINE-DEXTROAMPHET ER 30 MG PO CP24: 30.0000 mg | ORAL_CAPSULE | ORAL | 0 refills | Status: DC

## 2022-01-31 IMAGING — CT CT HEAD W/O CM
2 series · 15 of 37 positions shown, 18 images · non-contrast
Comparison: No pertinent prior studies available for comparison.

CLINICAL DATA: A vision loss, binocular. Additional provided:
Patient reports vomiting since yesterday and blurred vision today.

EXAM:
CT HEAD WITHOUT CONTRAST
TECHNIQUE: Contiguous axial images were obtained from the base of the skull
through the vertex without intravenous contrast.

[Series 2: head wo · axial · 0.47mm/px · z∈[-123,+7]mm · 12 of 32 slices shown, 15 images]
[im 3/32  brain]
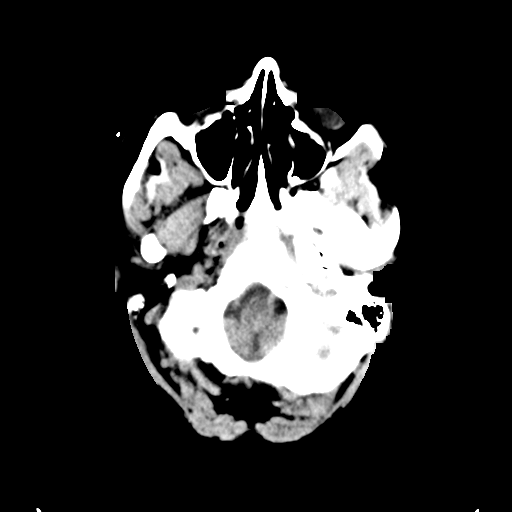
[im 3/32  bone]
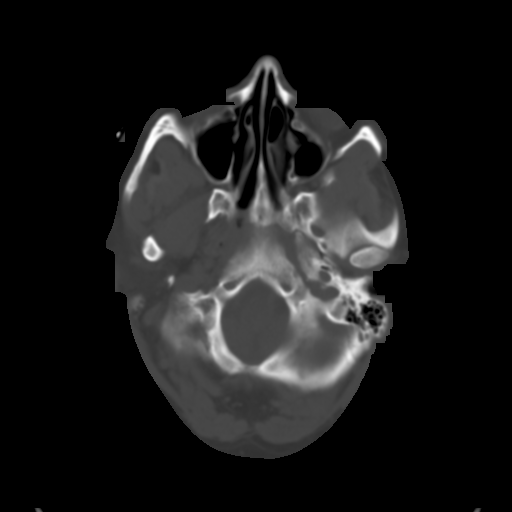
[im 5/32  brain]
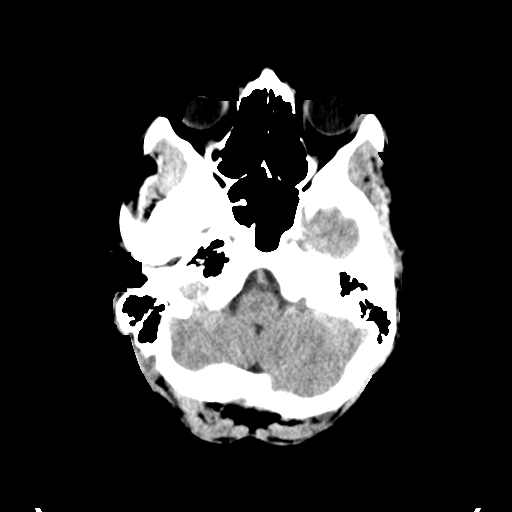
[im 7/32  brain]
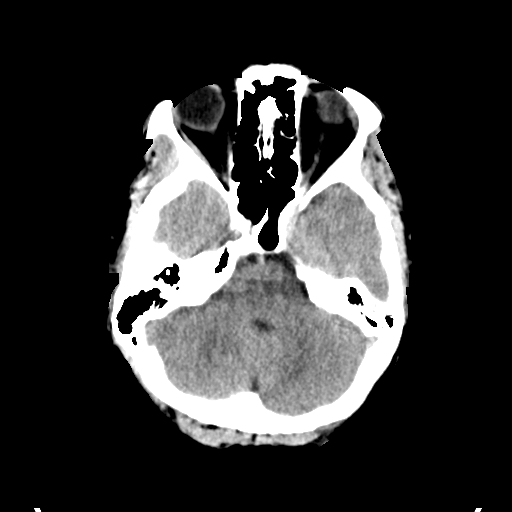
[im 10/32  brain]
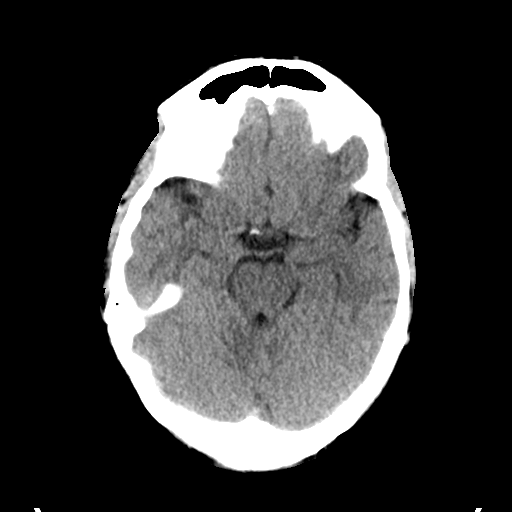
[im 12/32  brain]
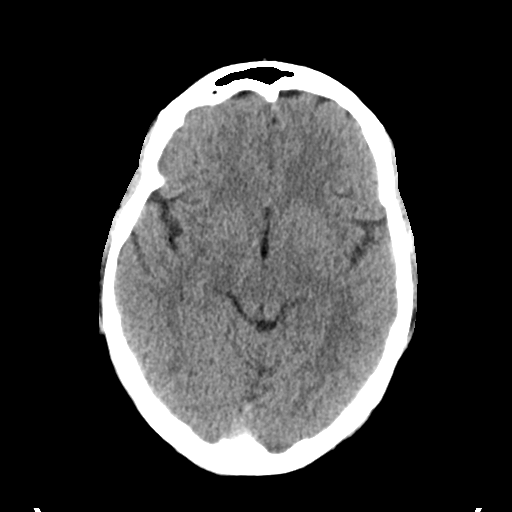
[im 12/32  bone]
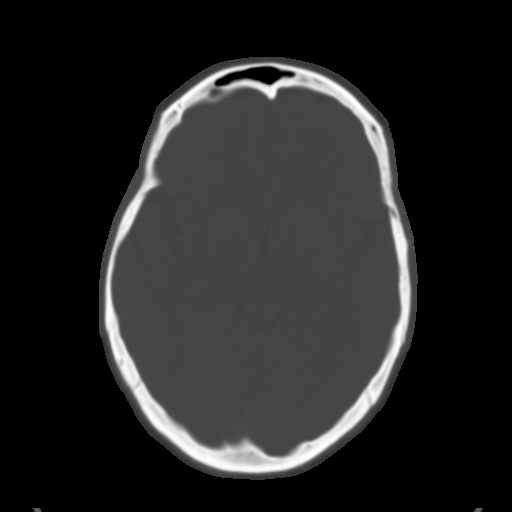
[im 14/32  brain]
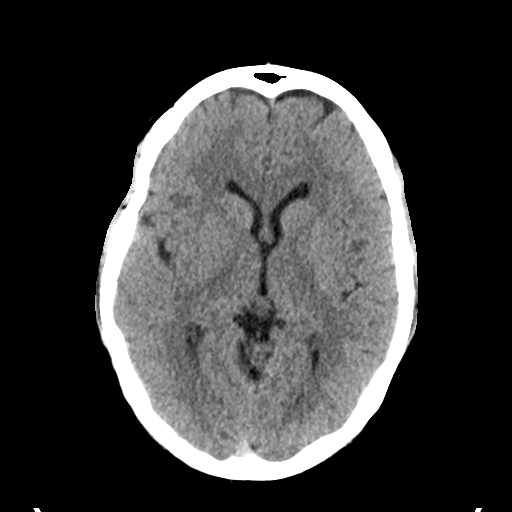
[im 18/32  brain]
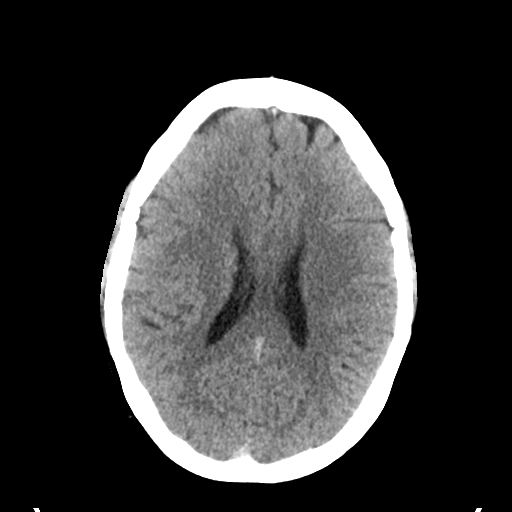
[im 20/32  brain]
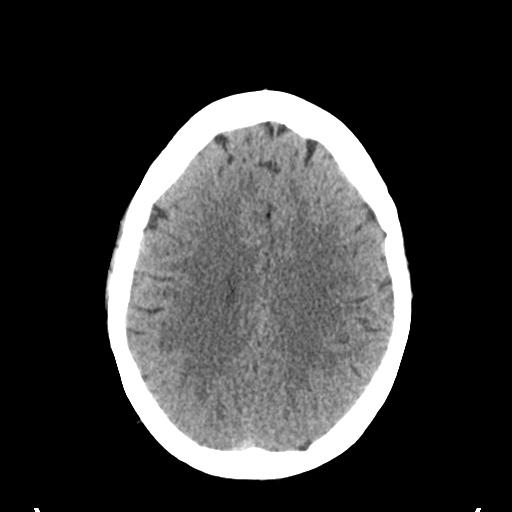
[im 22/32  brain]
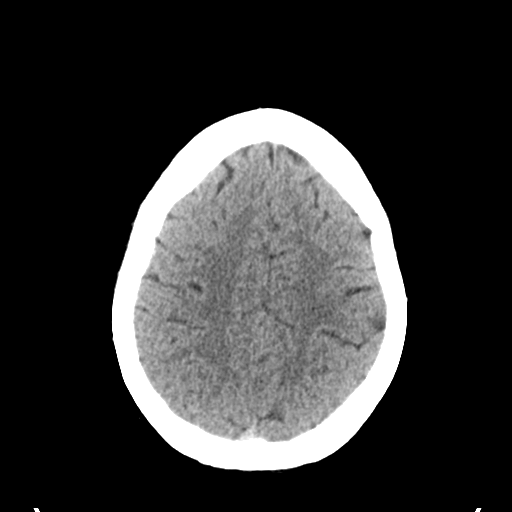
[im 22/32  bone]
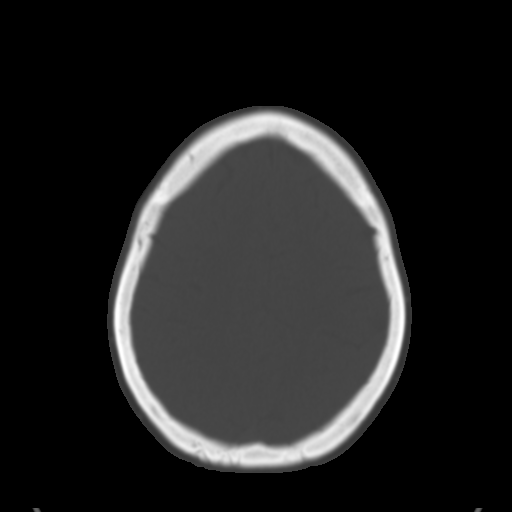
[im 25/32  brain]
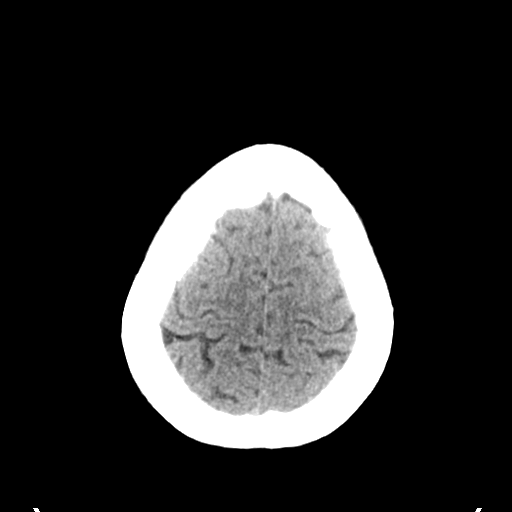
[im 27/32  brain]
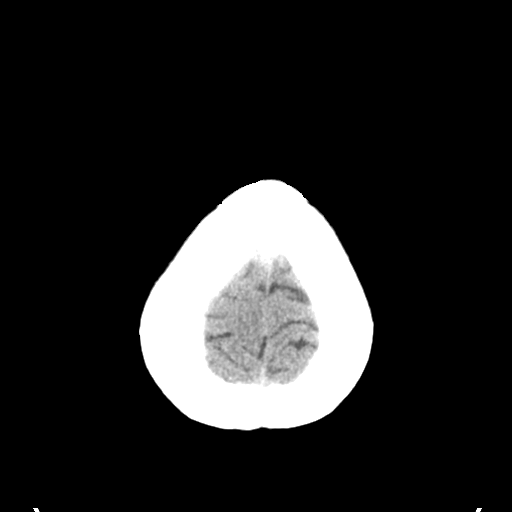
[im 29/32  brain]
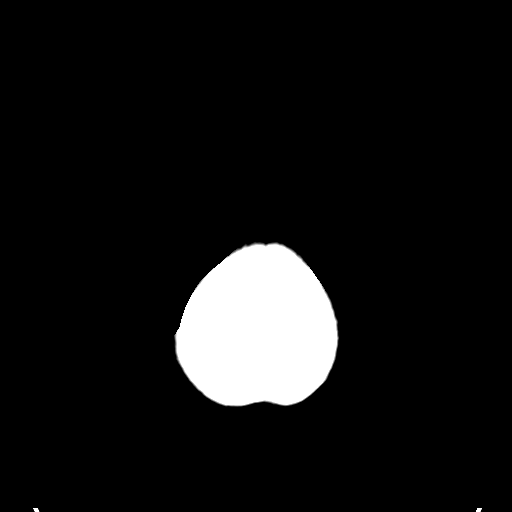

[Series 5: sagittal soft tissue · sagittal · 0.31mm/px · 3 of 58 slices shown]
[im 20/58  brain]
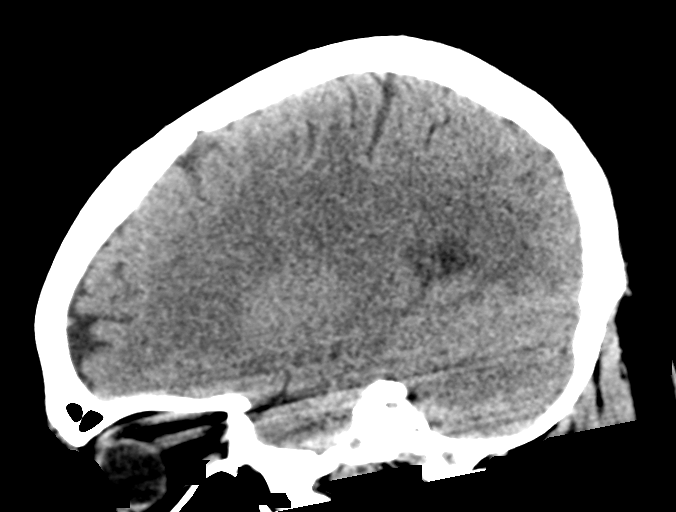
[im 29/58  brain]
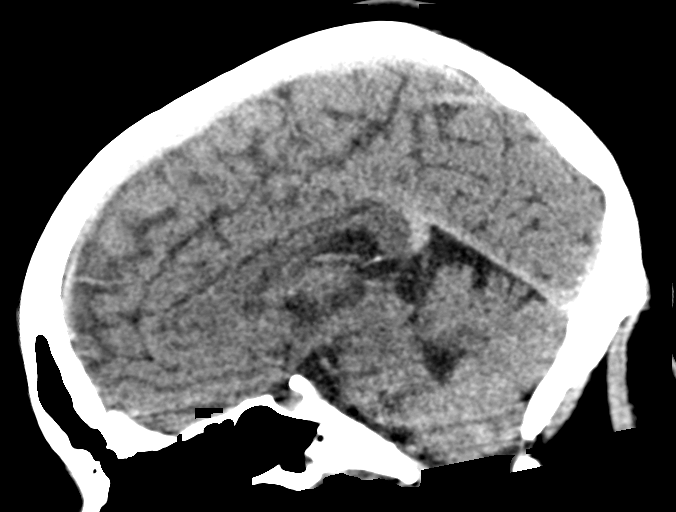
[im 39/58  brain]
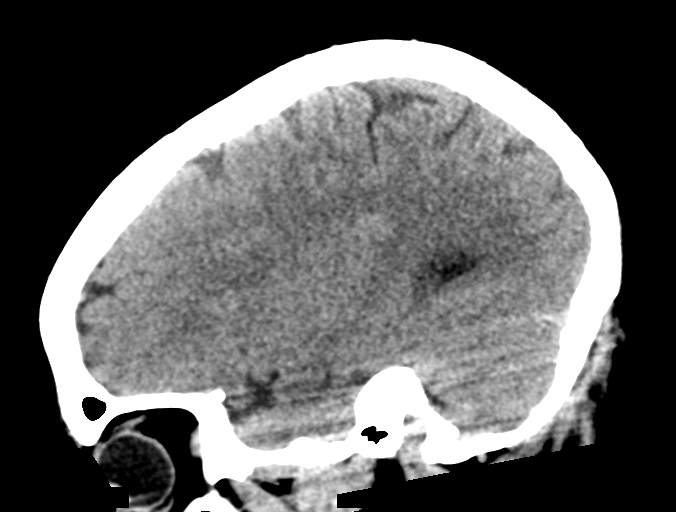

[15 of 37 positions shown; findings below may reference images not displayed]

FINDINGS: Brain:
Cerebral volume is normal for age.

There is no acute intracranial hemorrhage.

No demarcated cortical infarct.

No extra-axial fluid collection.

No evidence of intracranial mass.

No midline shift.

Partially empty sella turcica.

Vascular: No hyperdense vessel.

Skull: Normal. Negative for fracture or focal lesion.

Sinuses/Orbits: Visualized orbits show no acute finding. No
significant paranasal sinus disease or mastoid effusion at the
imaged levels.
IMPRESSION: Partially empty sella turcica. This is very commonly an incidental
finding, but can be associated with idiopathic intracranial
hypertension.

Otherwise unremarkable non-contrast CT appearance of the brain.

## 2022-02-02 NOTE — Progress Notes (Signed)
?  ? ?I,Elena D DeSanto,acting as a scribe for Gwyneth Sprout, FNP.,have documented all relevant documentation on the behalf of Gwyneth Sprout, FNP,as directed by  Gwyneth Sprout, FNP while in the presence of Gwyneth Sprout, FNP. ? ? ?Established patient visit ? ? ?Patient: Cynthia Glover   DOB: [birth year]   31 y.o. Female  MRN: [id number] ?Visit Date: 02/03/2022 ? ?Today's healthcare provider: Gwyneth Sprout, FNP  ?Re Introduced to nurse practitioner role and practice setting.  All questions answered.  Discussed provider/patient relationship and expectations. ? ? ?Subjective  ?  ?HPI  ?Patient is a 31 year old female who presents for evaluation of multiple symptoms.  She complains of low energy, shakes, hair loss, nausea, constant migraines and fatigue.  Patient states she has extremely increased anxiety.  She states it is overwhelming at times to the point she does not want to get out of bed. ?Symptoms are worse relating to the time of ovulation and her periods.   ?Patient has been unable to get her Adderall filled due to supply shortage. ? ?Medications: ?Outpatient Medications Prior to Visit  ?Medication Sig  ? [DISCONTINUED] amphetamine-dextroamphetamine (ADDERALL XR) 30 MG 24 hr capsule Take 1 capsule (30 mg total) by mouth every morning. (Patient not taking: Reported on 02/03/2022)  ? [DISCONTINUED] amphetamine-dextroamphetamine (ADDERALL) 10 MG tablet Take 1 tablet (10 mg total) by mouth daily with lunch. (Patient not taking: Reported on 02/03/2022)  ? [DISCONTINUED] hydrOXYzine (ATARAX) 10 MG tablet Take 1 tablet (10 mg total) by mouth 3 (three) times daily as needed. (Patient not taking: Reported on 02/03/2022)  ? [DISCONTINUED] propranolol (INDERAL) 40 MG tablet Take 1 tablet (40 mg total) by mouth daily as needed. (Patient not taking: Reported on 02/03/2022)  ? [DISCONTINUED] traZODone (DESYREL) 50 MG tablet Take 0.5-1 tablets (25-50 mg total) by mouth at bedtime as needed for sleep. (Patient not taking: Reported on  02/03/2022)  ? ?No facility-administered medications prior to visit.  ? ? ?Review of Systems  ?Constitutional:  Positive for fatigue. Negative for chills and diaphoresis.  ?HENT:  Positive for postnasal drip (causing nausea). Negative for congestion, sinus pressure, sinus pain and sore throat.   ?Respiratory:  Negative for cough and shortness of breath.   ?Cardiovascular:  Negative for chest pain, palpitations and leg swelling.  ?Neurological:  Positive for headaches.  ?Psychiatric/Behavioral:  Positive for decreased concentration, dysphoric mood and sleep disturbance. Negative for agitation, behavioral problems, confusion, hallucinations, self-injury and suicidal ideas. The patient is nervous/anxious. The patient is not hyperactive.   ? ? ?  Objective  ?  ?BP 134/76 (BP Location: Right Arm, Patient Position: Sitting, Cuff Size: Normal)   Pulse 84   Temp 98.9 ?F (37.2 ?C) (Oral)   Wt 196 lb (88.9 kg)   LMP 02/02/2022 (Exact Date)   SpO2 97%   BMI 29.80 kg/m?  ? ? ?Physical Exam ?Vitals and nursing note reviewed.  ?Constitutional:   ?   General: She is not in acute distress. ?   Appearance: Normal appearance. She is overweight. She is not ill-appearing, toxic-appearing or diaphoretic.  ?HENT:  ?   Head: Normocephalic and atraumatic.  ?Cardiovascular:  ?   Rate and Rhythm: Normal rate and regular rhythm.  ?   Pulses: Normal pulses.  ?   Heart sounds: Normal heart sounds. No murmur heard. ?  No friction rub. No gallop.  ?Pulmonary:  ?   Effort: Pulmonary effort is normal. No respiratory distress.  ?  Breath sounds: Normal breath sounds. No stridor. No wheezing, rhonchi or rales.  ?Chest:  ?   Chest wall: No tenderness.  ?Abdominal:  ?   General: Bowel sounds are normal.  ?   Palpations: Abdomen is soft.  ?Musculoskeletal:     ?   General: No swelling, tenderness, deformity or signs of injury. Normal range of motion.  ?   Right lower leg: No edema.  ?   Left lower leg: No edema.  ?Skin: ?   General: Skin is warm  and dry.  ?   Capillary Refill: Capillary refill takes less than 2 seconds.  ?   Coloration: Skin is not jaundiced or pale.  ?   Findings: No bruising, erythema, lesion or rash.  ?Neurological:  ?   General: No focal deficit present.  ?   Mental Status: She is alert and oriented to person, place, and time. Mental status is at baseline.  ?   Cranial Nerves: No cranial nerve deficit.  ?   Sensory: No sensory deficit.  ?   Motor: No weakness.  ?   Coordination: Coordination normal.  ?Psychiatric:     ?   Attention and Perception: Attention normal.     ?   Mood and Affect: Mood normal. Affect is blunt and flat.     ?   Behavior: Behavior normal.     ?   Thought Content: Thought content normal. Thought content does not include homicidal or suicidal ideation. Thought content does not include homicidal or suicidal plan.     ?   Judgment: Judgment normal.  ?  ? ?No results found for any visits on 02/03/22. ? Assessment & Plan  ?  ? ?Problem List Items Addressed This Visit   ? ?  ? Cardiovascular and Mediastinum  ? Migraine with aura and with status migrainosus, not intractable  ?  Chronic, worsening ?Has previously been seen by neuro but did not follow up ?Reports daily headaches ?Has been using BC powder; explained to patient the rebound response from use of BC and recommend use use a traditional migraine at first sign with Excedrin migraine OTC and avoid BC ?Will refer back to neurology if needed ?  ?  ? Relevant Medications  ? FLUoxetine (PROZAC) 20 MG tablet  ? SUMAtriptan (IMITREX) 100 MG tablet  ?  ? Other  ? Anxiety and depression  ?  Chronic, exacerbated ?-has tried many medications without much memory that anything helped ?-I've explained to her that drugs of the SSRI class can have side effects such as weight gain, sexual dysfunction, insomnia, headache, nausea. These medications are generally effective at alleviating symptoms of anxiety and/or depression. Let me know if significant side effects do occur. ?-Will  Start Prozac 20 mg daily ?Discussed potential side effects, incl GI upset, sexual dysfunction, increased anxiety, and SI ?Discussed that it can take 6-8 weeks to reach full efficacy ?Contracted for safety - no SI/HI ?Discussed synergistic effects of medications and therapy ? ?  ?  ? Relevant Medications  ? FLUoxetine (PROZAC) 20 MG tablet  ? diazepam (VALIUM) 2 MG tablet  ? Attention deficit hyperactivity disorder (ADHD), predominantly inattentive type - Primary  ?  Chronic, worsening ?Has been off of Adderall XR due to supply issues ?Will try to get new medication started for patient ?Made aware that a PA may be needed and it may not be available immediately ?Recommend follow up with insurance to discuss medication options to assist with treatment ?  ?  ?  Relevant Medications  ? lisdexamfetamine (VYVANSE) 50 MG capsule  ? Chronic fatigue  ?  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  ?  ? Relevant Orders  ? CBC with Differential/Platelet  ? Comprehensive Metabolic Panel (CMET)  ? Hemoglobin A1c  ? TSH + free T4  ? Panic attacks  ?  Chronic, worsening ?Did not get benefit from hydroxyine  ?Has been tearful and shaking due to panic ?Feels that her chest is tight and her heart is racing ?Had to lock herself in the bathroom the other day at work, she works as a Pharmacist, hospital, during an episode and cry it out to be able to move on ?Trial of valium with use of SSRI ?Has tried counseling and reported that it didn't help ? ?  ?  ? Relevant Medications  ? FLUoxetine (PROZAC) 20 MG tablet  ? diazepam (VALIUM) 2 MG tablet  ? ? ? ?Return in about 2 months (around 04/05/2022) for anxiety and depression.  ?   ? ?I, Gwyneth Sprout, FNP, have reviewed all documentation for this visit. The documentation on 02/03/22 for  the exam, diagnosis, procedures, and orders are all accurate and complete. ? ? ? ?Gwyneth Sprout, FNP  ?[place] ?[phone] (phone) ?[phone] (fax) ? ?[facility] ?

## 2022-02-03 ENCOUNTER — Other Ambulatory Visit: Payer: Self-pay

## 2022-02-03 ENCOUNTER — Ambulatory Visit: Payer: BC Managed Care – PPO | Admitting: Family Medicine

## 2022-02-03 ENCOUNTER — Encounter: Payer: Self-pay | Admitting: Family Medicine

## 2022-02-03 VITALS — BP 134/76 | HR 84 | Temp 98.9°F | Wt 196.0 lb

## 2022-02-03 DIAGNOSIS — F900 Attention-deficit hyperactivity disorder, predominantly inattentive type: Secondary | ICD-10-CM | POA: Diagnosis not present

## 2022-02-03 DIAGNOSIS — F419 Anxiety disorder, unspecified: Secondary | ICD-10-CM | POA: Diagnosis not present

## 2022-02-03 DIAGNOSIS — F410 Panic disorder [episodic paroxysmal anxiety] without agoraphobia: Secondary | ICD-10-CM | POA: Diagnosis not present

## 2022-02-03 DIAGNOSIS — F32A Depression, unspecified: Secondary | ICD-10-CM | POA: Insufficient documentation

## 2022-02-03 DIAGNOSIS — G43101 Migraine with aura, not intractable, with status migrainosus: Secondary | ICD-10-CM | POA: Diagnosis not present

## 2022-02-03 DIAGNOSIS — R5382 Chronic fatigue, unspecified: Secondary | ICD-10-CM | POA: Insufficient documentation

## 2022-02-03 MED ORDER — FLUOXETINE HCL 20 MG PO TABS: 20.0000 mg | ORAL_TABLET | Freq: Every day | ORAL | 1 refills | Status: DC

## 2022-02-03 MED ORDER — DIAZEPAM 2 MG PO TABS: 2.0000 mg | ORAL_TABLET | Freq: Four times a day (QID) | ORAL | 0 refills | Status: DC | PRN

## 2022-02-03 MED ORDER — SUMATRIPTAN SUCCINATE 100 MG PO TABS: 100.0000 mg | ORAL_TABLET | ORAL | 3 refills | Status: DC | PRN

## 2022-02-03 MED ORDER — LISDEXAMFETAMINE DIMESYLATE 50 MG PO CAPS: 50.0000 mg | ORAL_CAPSULE | Freq: Every day | ORAL | 0 refills | Status: DC

## 2022-02-03 NOTE — Assessment & Plan Note (Signed)
Chronic, worsening ?Has previously been seen by neuro but did not follow up ?Reports daily headaches ?Has been using BC powder; explained to patient the rebound response from use of BC and recommend use use a traditional migraine at first sign with Excedrin migraine OTC and avoid BC ?Will refer back to neurology if needed ?

## 2022-02-03 NOTE — Assessment & Plan Note (Signed)
Chronic, exacerbated ?-has tried many medications without much memory that anything helped ?-I've explained to her that drugs of the SSRI class can have side effects such as weight gain, sexual dysfunction, insomnia, headache, nausea. These medications are generally effective at alleviating symptoms of anxiety and/or depression. Let me know if significant side effects do occur. ?-Will Start Prozac 20 mg daily ?Discussed potential side effects, incl GI upset, sexual dysfunction, increased anxiety, and SI ?Discussed that it can take 6-8 weeks to reach full efficacy ?Contracted for safety - no SI/HI ?Discussed synergistic effects of medications and therapy ? ?

## 2022-02-03 NOTE — Assessment & Plan Note (Signed)
Chronic, worsening ?Did not get benefit from hydroxyine  ?Has been tearful and shaking due to panic ?Feels that her chest is tight and her heart is racing ?Had to lock herself in the bathroom the other day at work, she works as a Pharmacist, hospital, during an episode and cry it out to be able to move on ?Trial of valium with use of SSRI ?Has tried counseling and reported that it didn't help ? ?

## 2022-02-03 NOTE — Assessment & Plan Note (Signed)
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

## 2022-02-03 NOTE — Assessment & Plan Note (Signed)
Chronic, worsening ?Has been off of Adderall XR due to supply issues ?Will try to get new medication started for patient ?Made aware that a PA may be needed and it may not be available immediately ?Recommend follow up with insurance to discuss medication options to assist with treatment ?

## 2022-02-04 ENCOUNTER — Other Ambulatory Visit: Payer: Self-pay | Admitting: Family Medicine

## 2022-02-04 DIAGNOSIS — R5382 Chronic fatigue, unspecified: Secondary | ICD-10-CM

## 2022-02-04 DIAGNOSIS — E162 Hypoglycemia, unspecified: Secondary | ICD-10-CM

## 2022-02-04 LAB — CBC WITH DIFFERENTIAL/PLATELET
Basophils Absolute: 0.1 10*3/uL (ref 0.0–0.2)
Basos: 1 %
EOS (ABSOLUTE): 0.3 10*3/uL (ref 0.0–0.4)
Eos: 4 %
Hematocrit: 44.2 % (ref 34.0–46.6)
Hemoglobin: 15.6 g/dL (ref 11.1–15.9)
Immature Grans (Abs): 0 10*3/uL (ref 0.0–0.1)
Immature Granulocytes: 0 %
Lymphocytes Absolute: 2 10*3/uL (ref 0.7–3.1)
Lymphs: 30 %
MCH: 31.5 pg (ref 26.6–33.0)
MCHC: 35.3 g/dL (ref 31.5–35.7)
MCV: 89 fL (ref 79–97)
Monocytes Absolute: 0.3 10*3/uL (ref 0.1–0.9)
Monocytes: 5 %
Neutrophils Absolute: 3.9 10*3/uL (ref 1.4–7.0)
Neutrophils: 60 %
Platelets: 252 10*3/uL (ref 150–450)
RBC: 4.95 x10E6/uL (ref 3.77–5.28)
RDW: 12.7 % (ref 11.7–15.4)
WBC: 6.6 10*3/uL (ref 3.4–10.8)

## 2022-02-04 LAB — COMPREHENSIVE METABOLIC PANEL
ALT: 14 IU/L (ref 0–32)
AST: 15 IU/L (ref 0–40)
Albumin/Globulin Ratio: 1.8 (ref 1.2–2.2)
Albumin: 4.6 g/dL (ref 3.8–4.8)
Alkaline Phosphatase: 71 IU/L (ref 44–121)
BUN/Creatinine Ratio: 11 (ref 9–23)
BUN: 7 mg/dL (ref 6–20)
Bilirubin Total: 0.4 mg/dL (ref 0.0–1.2)
CO2: 20 mmol/L (ref 20–29)
Calcium: 9.2 mg/dL (ref 8.7–10.2)
Chloride: 103 mmol/L (ref 96–106)
Creatinine, Ser: 0.63 mg/dL (ref 0.57–1.00)
Globulin, Total: 2.5 g/dL (ref 1.5–4.5)
Glucose: 82 mg/dL (ref 70–99)
Potassium: 3.9 mmol/L (ref 3.5–5.2)
Sodium: 139 mmol/L (ref 134–144)
Total Protein: 7.1 g/dL (ref 6.0–8.5)
eGFR: 121 mL/min/{1.73_m2} (ref >59)

## 2022-02-04 LAB — TSH+FREE T4
Free T4: 1.13 ng/dL (ref 0.82–1.77)
TSH: 1.17 u[IU]/mL (ref 0.450–4.500)

## 2022-02-04 LAB — HEMOGLOBIN A1C
Est. average glucose Bld gHb Est-mCnc: 94 mg/dL
Hgb A1c MFr Bld: 4.9 % (ref 4.8–5.6)

## 2022-02-11 ENCOUNTER — Telehealth: Payer: Self-pay | Admitting: Physician Assistant

## 2022-02-11 DIAGNOSIS — R6889 Other general symptoms and signs: Secondary | ICD-10-CM

## 2022-02-11 MED ORDER — OSELTAMIVIR PHOSPHATE 75 MG PO CAPS: 75.0000 mg | ORAL_CAPSULE | Freq: Two times a day (BID) | ORAL | 0 refills | Status: DC

## 2022-02-11 NOTE — Patient Instructions (Signed)
?  [name], thank you for joining Leeanne Rio, PA-C for today's virtual visit.  While this provider is not your primary care provider (PCP), if your PCP is located in our provider database this encounter information will be shared with them immediately following your visit. ? ?Consent: ?(Patient) Cynthia Glover provided verbal consent for this virtual visit at the beginning of the encounter. ? ?Current Medications: ? ?Current Outpatient Medications:  ?  diazepam (VALIUM) 2 MG tablet, Take 1 tablet (2 mg total) by mouth every 6 (six) hours as needed for anxiety., Disp: 30 tablet, Rfl: 0 ?  FLUoxetine (PROZAC) 20 MG tablet, Take 1 tablet (20 mg total) by mouth daily., Disp: 90 tablet, Rfl: 1 ?  lisdexamfetamine (VYVANSE) 50 MG capsule, Take 1 capsule (50 mg total) by mouth daily., Disp: 30 capsule, Rfl: 0 ?  SUMAtriptan (IMITREX) 100 MG tablet, Take 1 tablet (100 mg total) by mouth every 2 (two) hours as needed for migraine. May repeat in 2 hours if headache persists or recurs., Disp: 10 tablet, Rfl: 3  ? ?Medications ordered in this encounter:  ?No orders of the defined types were placed in this encounter. ?  ? ?*If you need refills on other medications prior to your next appointment, please contact your pharmacy* ? ?Follow-Up: ?Call back or seek an in-person evaluation if the symptoms worsen or if the condition fails to improve as anticipated. ? ?Other Instructions ?Please keep well-hydrated and get plenty of rest.  ?Start the Tamiflu taking as directed. ?You can use OTC Theraflu or a Tylenol Cold and Sinus to help with other symptoms ?Run a humidifier in the bedroom at night. ?Start Vitamin D3 1000 units daily and Vitamin C 1000 mg daily for immune support.  ? ?If anything acutely worsens despite treatment, we want you to be evaluated in person.  ? ? ? ? ?If you have been instructed to have an in-person evaluation today at a local Urgent Care facility, please use the link below. It will take you to a  list of all of our available [facility], including address, phone number and hours of operation. Please do not delay care.  ?[facility] ? ?If you or a family member do not have a primary care provider, use the link below to schedule a visit and establish care. When you choose a [facility] primary care physician or advanced practice provider, you gain a long-term partner in health. ?Find a Primary Care Provider ? ?Learn more about [facility]'s in-office and virtual care options: ?[facility]  ?

## 2022-02-11 NOTE — Progress Notes (Signed)
?Virtual Visit Consent  ? ?[name], you are scheduled for a virtual visit with a [facility] provider today.   ?  ?Just as with appointments in the office, your consent must be obtained to participate.  Your consent will be active for this visit and any virtual visit you may have with one of our providers in the next 365 days.   ?  ?If you have a MyChart account, a copy of this consent can be sent to you electronically.  All virtual visits are billed to your insurance company just like a traditional visit in the office.   ? ?As this is a virtual visit, video technology does not allow for your provider to perform a traditional examination.  This may limit your provider's ability to fully assess your condition.  If your provider identifies any concerns that need to be evaluated in person or the need to arrange testing (such as labs, EKG, etc.), we will make arrangements to do so.   ?  ?Although advances in technology are sophisticated, we cannot ensure that it will always work on either your end or our end.  If the connection with a video visit is poor, the visit may have to be switched to a telephone visit.  With either a video or telephone visit, we are not always able to ensure that we have a secure connection.    ? ?I need to obtain your verbal consent now.   Are you willing to proceed with your visit today?  ?  ?MELONEY FELD has provided verbal consent on 02/11/2022 for a virtual visit (video or telephone). ?  ?Leeanne Rio, PA-C  ? ?Date: 02/11/2022 2:12 PM ? ? ?Virtual Visit via Video Note  ? ?ILeeanne Rio, connected with  Cynthia Glover  ([id number], Mar 20, 1991) on 02/11/22 at  2:00 PM EDT by a video-enabled telemedicine application and verified that I am speaking with the correct person using two identifiers. ? ?Location: ?Patient: Virtual Visit Location Patient: Home ?Provider: Virtual Visit Location Provider: Home Office ?  ?I discussed the limitations of evaluation and management by  telemedicine and the availability of in person appointments. The patient expressed understanding and agreed to proceed.   ? ?History of Present Illness: ?Cynthia Glover is a 31 y.o. who identifies as a female who was assigned female at birth, and is being seen today for flu-like symptoms starting 1.5 days ago. Notes she felt very fatigued a couple of nights ago. The next morning woke up with chills, aches, nasal congestion, sore throat and rhinorrhea. Also with persistent, dry cough and sinus tenderness. Notes exposures to flu at work [employer]) and concerned about this. Has not had flu shot this season. Did take a COVID test which was negative.  ? ?HPI: HPI  ?Problems:  ?Patient Active Problem List  ? Diagnosis Date Noted  ? Chronic fatigue 02/03/2022  ? Anxiety and depression 02/03/2022  ? Migraine with aura and with status migrainosus, not intractable 02/03/2022  ? Amenorrhea 09/23/2021  ? Other fatigue 09/23/2021  ? Fear of public speaking 82/50/5397  ? Panic attacks 09/23/2021  ? Attention deficit hyperactivity disorder (ADHD), predominantly inattentive type 01/01/2019  ? Labor and delivery indication for care or intervention 04/20/2018  ? Migraine without aura and without status migrainosus, not intractable 01/09/2018  ? Obesity 03/31/2016  ? BMI 32.0-32.9,adult 03/31/2016  ?  ?Allergies:  ?Allergies  ?Allergen Reactions  ? Penicillins Rash  ? ?Medications:  ?Current Outpatient Medications:  ?  oseltamivir (TAMIFLU) 75 MG capsule, Take 1 capsule (75 mg total) by mouth 2 (two) times daily., Disp: 10 capsule, Rfl: 0 ?  diazepam (VALIUM) 2 MG tablet, Take 1 tablet (2 mg total) by mouth every 6 (six) hours as needed for anxiety., Disp: 30 tablet, Rfl: 0 ?  FLUoxetine (PROZAC) 20 MG tablet, Take 1 tablet (20 mg total) by mouth daily., Disp: 90 tablet, Rfl: 1 ?  lisdexamfetamine (VYVANSE) 50 MG capsule, Take 1 capsule (50 mg total) by mouth daily., Disp: 30 capsule, Rfl: 0 ?  SUMAtriptan (IMITREX) 100 MG tablet, Take  1 tablet (100 mg total) by mouth every 2 (two) hours as needed for migraine. May repeat in 2 hours if headache persists or recurs., Disp: 10 tablet, Rfl: 3 ? ?Observations/Objective: ?Patient is well-developed, well-nourished in no acute distress.  ?Resting comfortably on bed at home.  ?Head is normocephalic, atraumatic.  ?No labored breating ?Speech is clear and coherent with logical content.  ?Patient is alert and oriented at baseline.  ? ?Assessment and Plan: ?1. Flu-like symptoms ?- oseltamivir (TAMIFLU) 75 MG capsule; Take 1 capsule (75 mg total) by mouth 2 (two) times daily.  Dispense: 10 capsule; Refill: 0 ? ?Negative COVID. + flu exposure with flu-like symptoms. Start Tamiflu. Supportive measures, OTC medications and vitamin recommendations reviewed. Work note provided.  ? ?Follow Up Instructions: ?I discussed the assessment and treatment plan with the patient. The patient was provided an opportunity to ask questions and all were answered. The patient agreed with the plan and demonstrated an understanding of the instructions.  A copy of instructions were sent to the patient via MyChart unless otherwise noted below.  ? ?The patient was advised to call back or seek an in-person evaluation if the symptoms worsen or if the condition fails to improve as anticipated. ? ?Time:  ?I spent 10 minutes with the patient via telehealth technology discussing the above problems/concerns.   ? ?Leeanne Rio, PA-C ?

## 2022-03-04 ENCOUNTER — Other Ambulatory Visit: Payer: Self-pay | Admitting: Family Medicine

## 2022-03-04 ENCOUNTER — Encounter: Payer: Self-pay | Admitting: Family Medicine

## 2022-03-04 DIAGNOSIS — F900 Attention-deficit hyperactivity disorder, predominantly inattentive type: Secondary | ICD-10-CM

## 2022-03-04 MED ORDER — AMPHETAMINE-DEXTROAMPHET ER 30 MG PO CP24: 30.0000 mg | ORAL_CAPSULE | Freq: Every day | ORAL | 0 refills | Status: DC

## 2022-03-24 ENCOUNTER — Telehealth: Payer: Self-pay

## 2022-03-24 ENCOUNTER — Other Ambulatory Visit: Payer: Self-pay | Admitting: Family Medicine

## 2022-03-24 ENCOUNTER — Encounter: Payer: Self-pay | Admitting: Family Medicine

## 2022-03-24 DIAGNOSIS — F32A Depression, unspecified: Secondary | ICD-10-CM

## 2022-03-24 DIAGNOSIS — E162 Hypoglycemia, unspecified: Secondary | ICD-10-CM

## 2022-03-24 DIAGNOSIS — F900 Attention-deficit hyperactivity disorder, predominantly inattentive type: Secondary | ICD-10-CM

## 2022-03-24 DIAGNOSIS — F410 Panic disorder [episodic paroxysmal anxiety] without agoraphobia: Secondary | ICD-10-CM

## 2022-03-24 MED ORDER — BUSPIRONE HCL 7.5 MG PO TABS: 7.5000 mg | ORAL_TABLET | Freq: Three times a day (TID) | ORAL | 0 refills | Status: DC

## 2022-03-24 NOTE — Telephone Encounter (Signed)
Copied from [facility]. Topic: Referral - Request for Referral ?>> Mar 23, 2022  4:22 PM Cynthia Glover wrote: ?Has patient seen PCP for this complaint? Yes.  Pt was sent Dr Ronnald Collum - diabetic dr by Cynthia Glover. Dr Ronnald Collum referred back to [facility] to refer to Dr Cynthia Glover for Psychiatry ?*If NO, is insurance requiring patient see PCP for this issue before PCP can refer them? ?Referral for which specialty:  Dr  Cynthia Glover -Psychiatry ?Preferred provider/office: needs to be Cynthia Glover ?Reason for referral: anxiety causing bs issues ?Any questions pt #  [phone] Pt is also wanting to know if she can get some meds while awaiting the psychiatry appt FU to advise pt at [phone] ?

## 2022-03-26 MED ORDER — DIAZEPAM 2 MG PO TABS: 2.0000 mg | ORAL_TABLET | Freq: Four times a day (QID) | ORAL | 0 refills | Status: DC | PRN

## 2022-04-07 ENCOUNTER — Ambulatory Visit: Payer: BC Managed Care – PPO | Admitting: Family Medicine

## 2022-04-27 ENCOUNTER — Ambulatory Visit: Payer: BC Managed Care – PPO | Admitting: Family Medicine

## 2022-05-14 ENCOUNTER — Other Ambulatory Visit: Payer: Self-pay | Admitting: Family Medicine

## 2022-05-14 DIAGNOSIS — F410 Panic disorder [episodic paroxysmal anxiety] without agoraphobia: Secondary | ICD-10-CM

## 2022-05-14 DIAGNOSIS — F900 Attention-deficit hyperactivity disorder, predominantly inattentive type: Secondary | ICD-10-CM

## 2022-05-14 MED ORDER — DIAZEPAM 2 MG PO TABS: 2.0000 mg | ORAL_TABLET | Freq: Four times a day (QID) | ORAL | 0 refills | Status: DC | PRN

## 2022-05-14 MED ORDER — AMPHETAMINE-DEXTROAMPHET ER 30 MG PO CP24: 30.0000 mg | ORAL_CAPSULE | Freq: Every day | ORAL | 0 refills | Status: DC

## 2022-07-05 ENCOUNTER — Other Ambulatory Visit: Payer: Self-pay | Admitting: Family Medicine

## 2022-07-05 DIAGNOSIS — F410 Panic disorder [episodic paroxysmal anxiety] without agoraphobia: Secondary | ICD-10-CM

## 2022-07-05 DIAGNOSIS — F900 Attention-deficit hyperactivity disorder, predominantly inattentive type: Secondary | ICD-10-CM

## 2022-07-06 MED ORDER — DIAZEPAM 2 MG PO TABS: 2.0000 mg | ORAL_TABLET | Freq: Four times a day (QID) | ORAL | 0 refills | Status: DC | PRN

## 2022-07-06 MED ORDER — AMPHETAMINE-DEXTROAMPHET ER 30 MG PO CP24: 30.0000 mg | ORAL_CAPSULE | Freq: Every day | ORAL | 0 refills | Status: DC

## 2022-07-07 NOTE — Progress Notes (Signed)
Established patient visit   Patient: Cynthia Glover   DOB: 07/07/1991   31 y.o. Female  MRN: [id number] Visit Date: 07/14/2022  Today's healthcare provider: Gwyneth Sprout, FNP   I,Tiffany J Bragg,acting as a scribe for Gwyneth Sprout, FNP.,have documented all relevant documentation on the behalf of Gwyneth Sprout, FNP,as directed by  Gwyneth Sprout, FNP while in the presence of Gwyneth Sprout, FNP.   Chief Complaint  Patient presents with   Weight Loss    Patient is here for weight loss management. She has tried phentermine with no success. Did an application online for wegovy and wanted to try that.    Subjective    HPI HPI     Weight Loss    Additional comments: Patient is here for weight loss management. She has tried phentermine with no success. Did an application online for wegovy and wanted to try that.       Last edited by Smitty Knudsen, CMA on 07/14/2022  2:23 PM.       Medications: Outpatient Medications Prior to Visit  Medication Sig   amphetamine-dextroamphetamine (ADDERALL XR) 30 MG 24 hr capsule Take 1 capsule (30 mg total) by mouth daily.   diazepam (VALIUM) 2 MG tablet Take 1 tablet (2 mg total) by mouth every 6 (six) hours as needed for anxiety.   busPIRone (BUSPAR) 7.5 MG tablet Take 1 tablet (7.5 mg total) by mouth 3 (three) times daily. (Patient not taking: Reported on 07/14/2022)   FLUoxetine (PROZAC) 20 MG tablet Take 1 tablet (20 mg total) by mouth daily. (Patient not taking: Reported on 07/14/2022)   SUMAtriptan (IMITREX) 100 MG tablet Take 1 tablet (100 mg total) by mouth every 2 (two) hours as needed for migraine. May repeat in 2 hours if headache persists or recurs. (Patient not taking: Reported on 07/14/2022)   No facility-administered medications prior to visit.    Review of Systems    Objective    BP 117/82 (BP Location: Left Arm, Patient Position: Sitting, Cuff Size: Normal)   Pulse 78   Temp 98.4 F (36.9 C) (Oral)   Resp 16   Ht 5'  7" (1.702 m)   Wt 198 lb (89.8 kg)   LMP 06/19/2022   SpO2 98%   BMI 31.01 kg/m   Physical Exam Vitals and nursing note reviewed.  Constitutional:      General: She is not in acute distress.    Appearance: Normal appearance. She is obese. She is not ill-appearing, toxic-appearing or diaphoretic.  HENT:     Head: Normocephalic and atraumatic.  Cardiovascular:     Rate and Rhythm: Normal rate and regular rhythm.     Pulses: Normal pulses.     Heart sounds: Normal heart sounds. No murmur heard.    No friction rub. No gallop.  Pulmonary:     Effort: Pulmonary effort is normal. No respiratory distress.     Breath sounds: Normal breath sounds. No stridor. No wheezing, rhonchi or rales.  Chest:     Chest wall: No tenderness.  Musculoskeletal:        General: No swelling, tenderness, deformity or signs of injury. Normal range of motion.     Right lower leg: No edema.     Left lower leg: No edema.  Skin:    General: Skin is warm and dry.     Capillary Refill: Capillary refill takes less than 2 seconds.     Coloration: Skin  is not jaundiced or pale.     Findings: No bruising, erythema, lesion or rash.  Neurological:     General: No focal deficit present.     Mental Status: She is alert and oriented to person, place, and time. Mental status is at baseline.     Cranial Nerves: No cranial nerve deficit.     Sensory: No sensory deficit.     Motor: No weakness.     Coordination: Coordination normal.  Psychiatric:        Mood and Affect: Mood normal.        Behavior: Behavior normal.        Thought Content: Thought content normal.        Judgment: Judgment normal.      No results found for any visits on 07/14/22.  Assessment & Plan     Problem List Items Addressed This Visit       Cardiovascular and Mediastinum   Migraine with aura and with status migrainosus, not intractable    Chronic, improving with hydration efforts Continue Imitrex 100 mg PRN      Relevant  Medications   Semaglutide-Weight Management 0.25 MG/0.5ML SOAJ     Other   Anxiety and depression    Chronic, improved Previous on Prozac 20 and Buspar 7.5 TID; use of PRN 2 mg Valium to assist Contracted for safety; denies SI or HI      Relevant Medications   Semaglutide-Weight Management 0.25 MG/0.5ML SOAJ   Chronic fatigue    Chronic, stable Denies recent exacerbations 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 Discussed importance of healthy weight management Discussed diet and exercise Wishes to start on Wegovy; discussed common side effects and titration       Relevant Medications   Semaglutide-Weight Management 0.25 MG/0.5ML SOAJ   Psychophysiological insomnia    Chronic, improved Chronic stress previously affecting sleep patterns       Relevant Medications   Semaglutide-Weight Management 0.25 MG/0.5ML SOAJ     Return in about 3 months (around 10/14/2022) for chonic disease management- OK for virutal .      I, Gwyneth Sprout, FNP, have reviewed all documentation for this visit. The documentation on 07/14/22 for the exam, diagnosis, procedures, and orders are all accurate and complete.    Gwyneth Sprout, [place] [phone] (phone) [phone] (fax)  [place]

## 2022-07-14 ENCOUNTER — Encounter: Payer: Self-pay | Admitting: Family Medicine

## 2022-07-14 ENCOUNTER — Ambulatory Visit (INDEPENDENT_AMBULATORY_CARE_PROVIDER_SITE_OTHER): Payer: BC Managed Care – PPO | Admitting: Family Medicine

## 2022-07-14 VITALS — BP 117/82 | HR 78 | Temp 98.4°F | Resp 16 | Ht 67.0 in | Wt 198.0 lb

## 2022-07-14 DIAGNOSIS — F32A Depression, unspecified: Secondary | ICD-10-CM

## 2022-07-14 DIAGNOSIS — R5382 Chronic fatigue, unspecified: Secondary | ICD-10-CM | POA: Diagnosis not present

## 2022-07-14 DIAGNOSIS — E661 Drug-induced obesity: Secondary | ICD-10-CM

## 2022-07-14 DIAGNOSIS — G4452 New daily persistent headache (NDPH): Secondary | ICD-10-CM | POA: Insufficient documentation

## 2022-07-14 DIAGNOSIS — Z6831 Body mass index (BMI) 31.0-31.9, adult: Secondary | ICD-10-CM

## 2022-07-14 DIAGNOSIS — G4489 Other headache syndrome: Secondary | ICD-10-CM

## 2022-07-14 DIAGNOSIS — F419 Anxiety disorder, unspecified: Secondary | ICD-10-CM | POA: Diagnosis not present

## 2022-07-14 DIAGNOSIS — F5104 Psychophysiologic insomnia: Secondary | ICD-10-CM

## 2022-07-14 DIAGNOSIS — G43101 Migraine with aura, not intractable, with status migrainosus: Secondary | ICD-10-CM | POA: Diagnosis not present

## 2022-07-14 MED ORDER — SEMAGLUTIDE-WEIGHT MANAGEMENT 0.25 MG/0.5ML ~~LOC~~ SOAJ: 0.2500 mg | SUBCUTANEOUS | 0 refills | Status: DC

## 2022-07-14 NOTE — Assessment & Plan Note (Signed)
Chronic, stable/improved Continue PRN imitrex 100

## 2022-07-14 NOTE — Assessment & Plan Note (Signed)
Chronic, improved Chronic stress previously affecting sleep patterns

## 2022-07-14 NOTE — Assessment & Plan Note (Signed)
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

## 2022-07-14 NOTE — Assessment & Plan Note (Signed)
Chronic, stable Body mass index is 31.01 kg/m. Discussed importance of healthy weight management Discussed diet and exercise Wishes to start on Wegovy; discussed common side effects and titration

## 2022-07-14 NOTE — Assessment & Plan Note (Signed)
Chronic, improving with hydration efforts Continue Imitrex 100 mg PRN

## 2022-07-14 NOTE — Assessment & Plan Note (Signed)
Chronic, improved Previous on Prozac 20 and Buspar 7.5 TID; use of PRN 2 mg Valium to assist Contracted for safety; denies SI or HI

## 2022-07-20 ENCOUNTER — Other Ambulatory Visit: Payer: Self-pay | Admitting: Family Medicine

## 2022-07-20 ENCOUNTER — Encounter: Payer: Self-pay | Admitting: Family Medicine

## 2022-07-20 DIAGNOSIS — F5104 Psychophysiologic insomnia: Secondary | ICD-10-CM

## 2022-07-20 DIAGNOSIS — E661 Drug-induced obesity: Secondary | ICD-10-CM

## 2022-07-20 DIAGNOSIS — R5382 Chronic fatigue, unspecified: Secondary | ICD-10-CM

## 2022-07-20 DIAGNOSIS — F419 Anxiety disorder, unspecified: Secondary | ICD-10-CM

## 2022-07-20 DIAGNOSIS — G43101 Migraine with aura, not intractable, with status migrainosus: Secondary | ICD-10-CM

## 2022-07-20 DIAGNOSIS — G4489 Other headache syndrome: Secondary | ICD-10-CM

## 2022-07-20 DIAGNOSIS — F32A Depression, unspecified: Secondary | ICD-10-CM

## 2022-07-20 NOTE — Telephone Encounter (Signed)
Medication Refill - Medication: Semaglutide-Weight Management 0.25 MG/0.5ML SOAJ [[id number]]   Has the patient contacted their pharmacy? No. (Agent: If no, request that the patient contact the pharmacy for the refill. If patient does not wish to contact the pharmacy document the reason why and proceed with request.) (Agent: If yes, when and what did the pharmacy advise?)  Preferred Pharmacy (with phone number or street name): [facility] [place]. [phone] Has the patient been seen for an appointment in the last year OR does the patient have an upcoming appointment? Yes.    Agent: Please be advised that RX refills may take up to 3 business days. We ask that you follow-up with your pharmacy.

## 2022-07-21 MED ORDER — SEMAGLUTIDE-WEIGHT MANAGEMENT 0.25 MG/0.5ML ~~LOC~~ SOAJ: 0.2500 mg | SUBCUTANEOUS | 0 refills | Status: DC

## 2022-07-21 NOTE — Telephone Encounter (Signed)
Resending to another pharmacy as provider ordered on 07/14/22.   Requested Prescriptions  Pending Prescriptions Disp Refills  . Semaglutide-Weight Management 0.25 MG/0.5ML SOAJ 2 mL 0    Sig: Inject 0.25 mg into the skin once a week.     Endocrinology:  Diabetes - GLP-1 Receptor Agonists - semaglutide Passed - 07/20/2022 12:17 PM      Passed - HBA1C in normal range and within 180 days    Hgb A1c MFr Bld  Date Value Ref Range Status  02/03/2022 4.9 4.8 - 5.6 % Final    Comment:             Prediabetes: 5.7 - 6.4          Diabetes: >6.4          Glycemic control for adults with diabetes: <7.0          Passed - Cr in normal range and within 360 days    Creatinine  Date Value Ref Range Status  04/13/2012 0.73 0.60 - 1.30 mg/dL Final   Creatinine, Ser  Date Value Ref Range Status  02/03/2022 0.63 0.57 - 1.00 mg/dL Final         Passed - Valid encounter within last 6 months    Recent Outpatient Visits          1 week ago Class 1 drug-induced obesity with serious comorbidity and body mass index (BMI) of 31.0 to 31.9 in adult   [facility] Tally Joe T, FNP   5 months ago Attention deficit hyperactivity disorder (ADHD), predominantly inattentive type   [facility] Tally Joe T, FNP   10 months ago Attention deficit hyperactivity disorder (ADHD), predominantly inattentive type   [facility] Gwyneth Sprout, FNP   1 year ago Encounter for physical examination related to employment   [employer], Vickki Muff, PA-C   1 year ago [place]

## 2022-08-04 ENCOUNTER — Other Ambulatory Visit: Payer: Self-pay | Admitting: Family Medicine

## 2022-08-04 DIAGNOSIS — R110 Nausea: Secondary | ICD-10-CM

## 2022-08-04 DIAGNOSIS — E661 Drug-induced obesity: Secondary | ICD-10-CM

## 2022-08-04 MED ORDER — SEMAGLUTIDE-WEIGHT MANAGEMENT 1.7 MG/0.75ML ~~LOC~~ SOAJ: 1.7000 mg | SUBCUTANEOUS | 0 refills | Status: DC

## 2022-08-04 MED ORDER — ONDANSETRON HCL 4 MG PO TABS: 4.0000 mg | ORAL_TABLET | Freq: Three times a day (TID) | ORAL | 0 refills | Status: DC | PRN

## 2022-08-06 ENCOUNTER — Emergency Department: Payer: Medicaid Other

## 2022-08-06 ENCOUNTER — Observation Stay
Admission: EM | Admit: 2022-08-06 | Discharge: 2022-08-07 | Disposition: A | Discharge status: Home / Self Care | Payer: Medicaid Other | Attending: Internal Medicine | Admitting: Internal Medicine

## 2022-08-06 ENCOUNTER — Ambulatory Visit: Payer: Self-pay | Admitting: *Deleted

## 2022-08-06 ENCOUNTER — Other Ambulatory Visit: Payer: Self-pay

## 2022-08-06 ENCOUNTER — Encounter: Payer: Self-pay | Admitting: Emergency Medicine

## 2022-08-06 DIAGNOSIS — Z20822 Contact with and (suspected) exposure to covid-19: Secondary | ICD-10-CM | POA: Diagnosis not present

## 2022-08-06 DIAGNOSIS — F32A Depression, unspecified: Secondary | ICD-10-CM | POA: Diagnosis not present

## 2022-08-06 DIAGNOSIS — E860 Dehydration: Secondary | ICD-10-CM

## 2022-08-06 DIAGNOSIS — F900 Attention-deficit hyperactivity disorder, predominantly inattentive type: Secondary | ICD-10-CM

## 2022-08-06 DIAGNOSIS — R0602 Shortness of breath: Secondary | ICD-10-CM | POA: Diagnosis not present

## 2022-08-06 DIAGNOSIS — G43909 Migraine, unspecified, not intractable, without status migrainosus: Secondary | ICD-10-CM

## 2022-08-06 DIAGNOSIS — E876 Hypokalemia: Secondary | ICD-10-CM | POA: Diagnosis not present

## 2022-08-06 DIAGNOSIS — Z79899 Other long term (current) drug therapy: Secondary | ICD-10-CM | POA: Diagnosis not present

## 2022-08-06 DIAGNOSIS — F909 Attention-deficit hyperactivity disorder, unspecified type: Secondary | ICD-10-CM | POA: Diagnosis not present

## 2022-08-06 DIAGNOSIS — R112 Nausea with vomiting, unspecified: Secondary | ICD-10-CM | POA: Diagnosis not present

## 2022-08-06 DIAGNOSIS — Z87891 Personal history of nicotine dependence: Secondary | ICD-10-CM | POA: Insufficient documentation

## 2022-08-06 DIAGNOSIS — F419 Anxiety disorder, unspecified: Secondary | ICD-10-CM | POA: Diagnosis present

## 2022-08-06 DIAGNOSIS — R1110 Vomiting, unspecified: Secondary | ICD-10-CM | POA: Diagnosis not present

## 2022-08-06 LAB — CBC WITH DIFFERENTIAL/PLATELET
Abs Immature Granulocytes: 0.07 10*3/uL (ref 0.00–0.07)
Basophils Absolute: 0 10*3/uL (ref 0.0–0.1)
Basophils Relative: 0 %
Eosinophils Absolute: 0 10*3/uL (ref 0.0–0.5)
Eosinophils Relative: 0 %
HCT: 48.9 % — ABNORMAL HIGH (ref 36.0–46.0)
Hemoglobin: 17.1 g/dL — ABNORMAL HIGH (ref 12.0–15.0)
Immature Granulocytes: 0 %
Lymphocytes Relative: 7 %
Lymphs Abs: 1.2 10*3/uL (ref 0.7–4.0)
MCH: 30.4 pg (ref 26.0–34.0)
MCHC: 35 g/dL (ref 30.0–36.0)
MCV: 86.9 fL (ref 80.0–100.0)
Monocytes Absolute: 0.6 10*3/uL (ref 0.1–1.0)
Monocytes Relative: 3 %
Neutro Abs: 15.5 10*3/uL — ABNORMAL HIGH (ref 1.7–7.7)
Neutrophils Relative %: 90 %
Platelets: 363 10*3/uL (ref 150–400)
RBC: 5.63 MIL/uL — ABNORMAL HIGH (ref 3.87–5.11)
RDW: 12.3 % (ref 11.5–15.5)
WBC: 17.5 10*3/uL — ABNORMAL HIGH (ref 4.0–10.5)
nRBC: 0 % (ref 0.0–0.2)

## 2022-08-06 LAB — URINALYSIS, ROUTINE W REFLEX MICROSCOPIC
Glucose, UA: NEGATIVE mg/dL
Hgb urine dipstick: NEGATIVE
Ketones, ur: >160 mg/dL — AB
Leukocytes,Ua: NEGATIVE
Nitrite: NEGATIVE
Protein, ur: 100 mg/dL — AB
Specific Gravity, Urine: 1.025 (ref 1.005–1.030)
pH: 5.5 (ref 5.0–8.0)

## 2022-08-06 LAB — SARS CORONAVIRUS 2 BY RT PCR: SARS Coronavirus 2 by RT PCR: NEGATIVE

## 2022-08-06 LAB — BASIC METABOLIC PANEL
Anion gap: 10 (ref 5–15)
BUN: 18 mg/dL (ref 6–20)
CO2: 25 mmol/L (ref 22–32)
Calcium: 9.4 mg/dL (ref 8.9–10.3)
Chloride: 103 mmol/L (ref 98–111)
Creatinine, Ser: 0.7 mg/dL (ref 0.44–1.00)
GFR, Estimated: >60 mL/min (ref >60)
Glucose, Bld: 94 mg/dL (ref 70–99)
Potassium: 3.4 mmol/L — ABNORMAL LOW (ref 3.5–5.1)
Sodium: 138 mmol/L (ref 135–145)

## 2022-08-06 LAB — COMPREHENSIVE METABOLIC PANEL
ALT: 20 U/L (ref 0–44)
AST: 30 U/L (ref 15–41)
Albumin: 5.8 g/dL — ABNORMAL HIGH (ref 3.5–5.0)
Alkaline Phosphatase: 55 U/L (ref 38–126)
Anion gap: 20 — ABNORMAL HIGH (ref 5–15)
BUN: 23 mg/dL — ABNORMAL HIGH (ref 6–20)
CO2: 19 mmol/L — ABNORMAL LOW (ref 22–32)
Calcium: 10.9 mg/dL — ABNORMAL HIGH (ref 8.9–10.3)
Chloride: 102 mmol/L (ref 98–111)
Creatinine, Ser: 0.87 mg/dL (ref 0.44–1.00)
GFR, Estimated: >60 mL/min (ref >60)
Glucose, Bld: 127 mg/dL — ABNORMAL HIGH (ref 70–99)
Potassium: 3.6 mmol/L (ref 3.5–5.1)
Sodium: 141 mmol/L (ref 135–145)
Total Bilirubin: 1.5 mg/dL — ABNORMAL HIGH (ref 0.3–1.2)
Total Protein: 9.4 g/dL — ABNORMAL HIGH (ref 6.5–8.1)

## 2022-08-06 LAB — TROPONIN I (HIGH SENSITIVITY)
Troponin I (High Sensitivity): 2 ng/L (ref <18)
Troponin I (High Sensitivity): 2 ng/L (ref <18)

## 2022-08-06 LAB — MAGNESIUM: Magnesium: 2 mg/dL (ref 1.7–2.4)

## 2022-08-06 LAB — LIPASE, BLOOD: Lipase: 26 U/L (ref 11–51)

## 2022-08-06 LAB — HCG, QUANTITATIVE, PREGNANCY: hCG, Beta Chain, Quant, S: <1 m[IU]/mL (ref <5)

## 2022-08-06 MED ORDER — ENOXAPARIN SODIUM 40 MG/0.4ML IJ SOSY
40.0000 mg | PREFILLED_SYRINGE | INTRAMUSCULAR | Status: DC
  Administered 2022-08-06: 40 mg via SUBCUTANEOUS
  Filled 2022-08-06: qty 0.4

## 2022-08-06 MED ORDER — SODIUM CHLORIDE 0.9 % IV SOLN
12.5000 mg | Freq: Once | INTRAVENOUS | Status: AC
  Administered 2022-08-06: 12.5 mg via INTRAVENOUS
  Filled 2022-08-06: qty 0.5

## 2022-08-06 MED ORDER — ONDANSETRON HCL 4 MG/2ML IJ SOLN
4.0000 mg | Freq: Once | INTRAMUSCULAR | Status: AC
  Administered 2022-08-06: 4 mg via INTRAVENOUS
  Filled 2022-08-06: qty 2

## 2022-08-06 MED ORDER — LACTATED RINGERS IV BOLUS
1000.0000 mL | Freq: Once | INTRAVENOUS | Status: AC
  Administered 2022-08-06: 1000 mL via INTRAVENOUS

## 2022-08-06 MED ORDER — AMPHETAMINE-DEXTROAMPHET ER 30 MG PO CP24
30.0000 mg | ORAL_CAPSULE | Freq: Every day | ORAL | Status: DC
  Administered 2022-08-07: 30 mg via ORAL
  Filled 2022-08-06: qty 6
  Filled 2022-08-06: qty 1

## 2022-08-06 MED ORDER — DROPERIDOL 2.5 MG/ML IJ SOLN
2.5000 mg | Freq: Once | INTRAMUSCULAR | Status: AC
  Administered 2022-08-06: 2.5 mg via INTRAVENOUS
  Filled 2022-08-06: qty 2

## 2022-08-06 MED ORDER — MAGNESIUM HYDROXIDE 400 MG/5ML PO SUSP: 30.0000 mL | Freq: Every day | ORAL | Status: DC | PRN

## 2022-08-06 MED ORDER — METOCLOPRAMIDE HCL 5 MG/ML IJ SOLN
10.0000 mg | Freq: Three times a day (TID) | INTRAMUSCULAR | Status: DC
  Administered 2022-08-06 – 2022-08-07 (×2): 10 mg via INTRAVENOUS
  Filled 2022-08-06 (×2): qty 2

## 2022-08-06 MED ORDER — IOHEXOL 300 MG/ML  SOLN
100.0000 mL | Freq: Once | INTRAMUSCULAR | Status: AC | PRN
  Administered 2022-08-06: 100 mL via INTRAVENOUS

## 2022-08-06 MED ORDER — ACETAMINOPHEN 325 MG PO TABS: 650.0000 mg | ORAL_TABLET | Freq: Four times a day (QID) | ORAL | Status: DC | PRN

## 2022-08-06 MED ORDER — POTASSIUM CHLORIDE 20 MEQ PO PACK
40.0000 meq | PACK | Freq: Once | ORAL | Status: AC
  Administered 2022-08-06: 40 meq via ORAL
  Filled 2022-08-06: qty 2

## 2022-08-06 MED ORDER — ONDANSETRON HCL 4 MG PO TABS: 4.0000 mg | ORAL_TABLET | Freq: Four times a day (QID) | ORAL | Status: DC | PRN

## 2022-08-06 MED ORDER — DIAZEPAM 2 MG PO TABS: 2.0000 mg | ORAL_TABLET | Freq: Four times a day (QID) | ORAL | Status: DC | PRN

## 2022-08-06 MED ORDER — ACETAMINOPHEN 650 MG RE SUPP: 650.0000 mg | Freq: Four times a day (QID) | RECTAL | Status: DC | PRN

## 2022-08-06 MED ORDER — TRAZODONE HCL 50 MG PO TABS
25.0000 mg | ORAL_TABLET | Freq: Every evening | ORAL | Status: DC | PRN
  Administered 2022-08-07: 25 mg via ORAL
  Filled 2022-08-06: qty 1

## 2022-08-06 MED ORDER — PANTOPRAZOLE SODIUM 40 MG IV SOLR
40.0000 mg | Freq: Two times a day (BID) | INTRAVENOUS | Status: DC
  Administered 2022-08-06 – 2022-08-07 (×2): 40 mg via INTRAVENOUS
  Filled 2022-08-06 (×2): qty 10

## 2022-08-06 MED ORDER — POTASSIUM CHLORIDE IN NACL 20-0.9 MEQ/L-% IV SOLN
INTRAVENOUS | Status: DC
  Filled 2022-08-06 (×3): qty 1000

## 2022-08-06 MED ORDER — ONDANSETRON HCL 4 MG/2ML IJ SOLN
4.0000 mg | Freq: Four times a day (QID) | INTRAMUSCULAR | Status: DC | PRN
  Administered 2022-08-07 (×2): 4 mg via INTRAVENOUS
  Filled 2022-08-06 (×2): qty 2

## 2022-08-06 MED ORDER — SUMATRIPTAN SUCCINATE 50 MG PO TABS: 100.0000 mg | ORAL_TABLET | ORAL | Status: DC | PRN

## 2022-08-06 NOTE — ED Notes (Signed)
Pt transported to CT ?

## 2022-08-06 NOTE — ED Provider Notes (Signed)
Emergency department handoff note  Care of this patient was signed out to me at the end of the previous provider shift.  All pertinent patient information was conveyed and all questions were answered.  Patient pending p.o. tolerance.  Despite Zofran and Phenergan as well as droperidol, patient still complains of persistent nausea/vomiting.  CT of the abdomen and pelvis with IV contrast was performed with no evidence of acute abnormalities.  I spoke to the on-call hospitalist service who agreed to accept this patient for further evaluation and management.  Dispo: Admit to medicine   Naaman Plummer, MD 08/06/22 [phone]

## 2022-08-06 NOTE — Assessment & Plan Note (Signed)
-   We will continue Valium.

## 2022-08-06 NOTE — Assessment & Plan Note (Addendum)
-   The patient will be a medical telemetry observation bed. - This is likely secondary to Ozempic that is stopped. - We will continue hydration with IV normal saline with added potassium chloride. - We will place her on scheduled IV Reglan. - We will add IV PPI therapy with Protonix. - She will be on clear liquids to be advanced as tolerated.Marland Kitchen

## 2022-08-06 NOTE — ED Notes (Addendum)
Pt states unable to tolerate PO challenge. MD aware.

## 2022-08-06 NOTE — ED Notes (Signed)
Lab called for update on hcg

## 2022-08-06 NOTE — Assessment & Plan Note (Signed)
-   She will be placed on as needed Imitrex.

## 2022-08-06 NOTE — ED Triage Notes (Signed)
Took first injection of wegovy on Wednesday.  STates since, has not eaten anything and vomiting after drinking any fluid.  Arrives hyperventilating.  AAOx3

## 2022-08-06 NOTE — Assessment & Plan Note (Signed)
-   We will continue Adderall.

## 2022-08-06 NOTE — ED Notes (Signed)
Pt stating nausea has gotten better but is still present. Pt concerned to go home due to symptoms getting worse upon discharge.

## 2022-08-06 NOTE — ED Provider Notes (Signed)
[facility] Provider Note    Event Date/Time   First MD Initiated Contact with Patient 08/06/22 [phone]     (approximate)   History   Chief Complaint Nausea   HPI  Cynthia Glover is a 31 y.o. female with past medical history of anxiety and migraines who presents to the ED complaining of nausea and vomiting.  Patient reports that she received a Wegovy shot 2 days ago for weight loss.  Within 2 hours of receiving this medication, she began to feel nauseous with multiple episodes of vomiting.  She reports some rash and swelling around her face yesterday, which has since resolved.  She continued to feel nauseous with multiple episodes of vomiting, was prescribed Zofran but has been unable to keep this down.  She denies any associated abdominal pain or diarrhea, did wake up feeling somewhat short of breath earlier today.  She denies any fevers, cough, or chest pain.  She has not noticed any pain or swelling in her legs.     Physical Exam   Triage Vital Signs: ED Triage Vitals  Enc Vitals Group     BP 08/06/22 0950 (!) 130/90     Pulse Rate 08/06/22 0950 (!) 105     Resp 08/06/22 0943 (!) 58     Temp 08/06/22 0950 97.8 F (36.6 C)     Temp Source 08/06/22 0950 Axillary     SpO2 08/06/22 0950 100 %     Weight 08/06/22 0941 190 lb (86.2 kg)     Height 08/06/22 0941 '5\' 7"'$  (1.702 m)     Head Circumference --      Peak Flow --      Pain Score 08/06/22 0940 7     Pain Loc --      Pain Edu? --      Excl. in [place]? --     Most recent vital signs: Vitals:   08/06/22 1600 08/06/22 1602  BP: 113/87   Pulse: (!) 115 96  Resp: (!) 24 17  Temp:    SpO2: 98% 97%    Constitutional: Alert and oriented. Eyes: Conjunctivae are normal. Head: Atraumatic.  No facial rash or edema noted. Nose: No congestion/rhinnorhea. Mouth/Throat: Mucous membranes are moist.  Oropharynx clear without edema. Cardiovascular: Normal rate, regular rhythm. Grossly normal heart sounds.  2+  radial pulses bilaterally. Respiratory: Tachypneic with normal respiratory effort.  No retractions. Lungs CTAB. Gastrointestinal: Soft and nontender. No distention. Musculoskeletal: No lower extremity tenderness nor edema.  Neurologic:  Normal speech and language. No gross focal neurologic deficits are appreciated.    ED Results / Procedures / Treatments   Labs (all labs ordered are listed, but only abnormal results are displayed) Labs Reviewed  CBC WITH DIFFERENTIAL/PLATELET - Abnormal; Notable for the following components:      Result Value   WBC 17.5 (*)    RBC 5.63 (*)    Hemoglobin 17.1 (*)    HCT 48.9 (*)    Neutro Abs 15.5 (*)    All other components within normal limits  COMPREHENSIVE METABOLIC PANEL - Abnormal; Notable for the following components:   CO2 19 (*)    Glucose, Bld 127 (*)    BUN 23 (*)    Calcium 10.9 (*)    Total Protein 9.4 (*)    Albumin 5.8 (*)    Total Bilirubin 1.5 (*)    Anion gap 20 (*)    All other components within normal limits  URINALYSIS, ROUTINE  W REFLEX MICROSCOPIC - Abnormal; Notable for the following components:   Color, Urine YELLOW (*)    APPearance HAZY (*)    Bilirubin Urine SMALL (*)    Ketones, ur >160 (*)    Protein, ur 100 (*)    Bacteria, UA FEW (*)    All other components within normal limits  LIPASE, BLOOD  HCG, QUANTITATIVE, PREGNANCY  BASIC METABOLIC PANEL  TROPONIN I (HIGH SENSITIVITY)  TROPONIN I (HIGH SENSITIVITY)     EKG  ED ECG REPORT I, Blake Divine, the attending physician, personally viewed and interpreted this ECG.   Date: 08/06/2022  EKG Time: 9:43  Rate: 120  Rhythm: sinus tachycardia  Axis: Normal  Intervals:none  ST&T Change: None  RADIOLOGY Chest x-ray reviewed and interpreted by me with no infiltrate, edema, or effusion.  PROCEDURES:  Critical Care performed: No  Procedures   MEDICATIONS ORDERED IN ED: Medications  lactated ringers bolus 1,000 mL (0 mLs Intravenous Stopped  08/06/22 1245)  ondansetron (ZOFRAN) injection 4 mg (4 mg Intravenous Given 08/06/22 1034)  promethazine (PHENERGAN) 12.5 mg in sodium chloride 0.9 % 50 mL IVPB (0 mg Intravenous Stopped 08/06/22 1513)  lactated ringers bolus 1,000 mL (0 mLs Intravenous Stopped 08/06/22 1513)     IMPRESSION / MDM / ASSESSMENT AND PLAN / ED COURSE  I reviewed the triage vital signs and the nursing notes.                              31 y.o. female with past medical history of anxiety and migraines who presents to the ED complaining of 2 days of nausea and vomiting after receiving a semaglutide shot for weight loss, now reports some difficulty breathing starting earlier this morning.  Patient's presentation is most consistent with acute presentation with potential threat to life or bodily function.  Differential diagnosis includes, but is not limited to, dehydration, electrolyte abnormality, AKI, medication side effect, ACS, PE, pneumonia, aspiration, anxiety, anaphylaxis.  Patient tachypneic and hyperventilating on my assessment, also noted to be tachycardic, but no increased work of breathing and she is maintaining oxygen saturations at 100% on room air.  EKG shows sinus tachycardia with no ischemic changes and there may be a significant component of anxiety contributing to her tachypnea.  We will screen troponin and chest x-ray, low suspicion for ACS, PE, or aspiration at this time.  Plan to hydrate with IV fluids and treat symptomatically with IV Zofran, labs and pregnancy testing are pending.  Nausea, vomiting, and GI upset are known significant side effect of semaglutide, which is likely the source of her symptoms.  Although she reports some facial rash and swelling yesterday, this appears to have completely resolved and no findings concerning for anaphylaxis currently.  On reassessment following Zofran and IV fluids, patient's work of breathing is much improved, she is no longer tachypneic and continues to  maintain oxygen saturations on room air.  Labs remarkable for anion gap acidosis without significant AKI or electrolyte abnormality.  Patient also noted to to have leukocytosis with elevated hemoglobin, most consistent with vomiting and hemoconcentration.  Urinalysis does show significant ketones and suspect starvation ketosis with her frequent vomiting.  We will give 2 L of IV fluids and repeat BMP to see if acidosis improves.  2 sets of troponin are within normal limits and I doubt ACS or PE, chest x-ray is also unremarkable.  Patient does report ongoing nausea on reassessment, has been  unable to tolerate liquids.  We will treat with IV Phenergan and reassess.  Patient turned over to oncoming provider pending reassessment and p.o. challenge.      FINAL CLINICAL IMPRESSION(S) / ED DIAGNOSES   Final diagnoses:  Nausea and vomiting, unspecified vomiting type  Dehydration     Rx / DC Orders   ED Discharge Orders     None        Note:  This document was prepared using Dragon voice recognition software and may include unintentional dictation errors.   Blake Divine, MD 08/06/22 [phone]

## 2022-08-06 NOTE — Telephone Encounter (Addendum)
  Chief Complaint: vomiting non stop since Thursday morning after starting the [facility] on Wed.   Also has a rash on her face and neck since starting the [facility]. Symptoms: Non stop vomiting.   Can't keep anything down.   Dizzy when gets up from sitting and feels very weak.   "I'm hardly peeing".  Also has a sore throat.     Rash on face and neck Frequency: Since Thur. morning Pertinent Negatives: Patient denies diarrhea or fever.    Disposition: '[x]'$ ED /'[]'$ Urgent Care (no appt availability in office) / '[]'$ Appointment(In office/virtual)/ '[]'$  [facility]/ '[]'$ Home Care/ '[]'$ Refused Recommended Disposition /'[]'$ [facility]/ '[]'$  Follow-up with PCP Additional Notes: Referred to the ED due to severe dehydration symptoms per protocol.    This information sent to Tally Joe, FNP.   I attempted to call [facility] to let them know of the ED referral but no answer.   I sent my notes high priority.

## 2022-08-06 NOTE — ED Notes (Signed)
Pt provided water for PO challenge

## 2022-08-06 NOTE — H&P (Addendum)
[facility]   PATIENT NAME: Cynthia Glover    MR#:  [id number]  DATE OF BIRTH:  1991/01/27  DATE OF ADMISSION:  08/06/2022  PRIMARY CARE PHYSICIAN: Gwyneth Sprout, FNP   Patient is coming from: Home  REQUESTING/REFERRING PHYSICIAN: Valora Piccolo, MD   CHIEF COMPLAINT:   Chief Complaint  Patient presents with   Nausea    HISTORY OF PRESENT ILLNESS:  Cynthia Glover is a 31 y.o. Caucasian female with medical history significant for anxiety and depression as well as migraine, who presented to emergency room with a [place] intractable nausea and vomiting since Wednesday night.  The patient took Ozempic prior to that for weight loss.  She denies any diarrhea.  She denies any bilious vomitus or hematemesis.  She admits to chills but denies any fever no abdominal pain or melena or bright red blood per rectum.  No dysuria, oliguria or hematuria or flank pain.  She denies any chest pain or palpitations.  No cough or wheezing or dyspnea.  ED Course: When she came to the ER, BP was 130/90 with heart rate 105 respiratory rate of 39 and later with hydration heart rate was 101 with respiratory rate of 17.  Labs revealed a BUN of 23 and later 18 and potassium of 3.6 and later mild hypokalemia with a potassium of 3.4 with otherwise unremarkable BMP.  Seems 10.9 and later 9.4 anion gap 20 and later 10.  LFTs showed total protein of 9.4 with albumin of 5.8 and total bili 1.5.  High sensitive troponin I was 2 twice and CBC showed leukocytosis 17.5 with neutrophilia and hemoconcentration.  Serum pregnancy test was negative UA showed 100 protein and more than 160 ketones and 0-5 WBCs with few bacteria. EKG as reviewed by me :  EKG showed sinus tachycardia with rate of 120 with poor R wave progression. Imaging: Portable chest x-ray showed no acute cardiopulmonary disease.  Abdominal pelvic CT scan revealed possible submucosal versus endometrial mass along the right uterine corpus suggesting correlation  with nonemergent pelvic ultrasound.  There is no CT evidence for acute intra abdominal or pelvic abnormality.  The patient was given 1 L bolus of IV lactated Ringer twice, 4 mg of IV Zofran, 12.5 mg of IV Phenergan and 2.5 mg of IV Inapsine.  She will be admitted to a medical telemetry observation bed for further evaluation and management. PAST MEDICAL HISTORY:   Past Medical History:  Diagnosis Date   Anxiety    Depression   -Migraine  PAST SURGICAL HISTORY:  History reviewed. No pertinent surgical history. No reported previous surgeries. SOCIAL HISTORY:   Social History   Tobacco Use   Smoking status: Former   Smokeless tobacco: Never   Tobacco comments:    Quit in 2015. Smoked for 5 years.  Substance Use Topics   Alcohol use: No    FAMILY HISTORY:   Family History  Problem Relation Age of Onset   Depression Father    Diabetes Father    Depression Brother    Anxiety disorder Brother     DRUG ALLERGIES:   Allergies  Allergen Reactions   Penicillins Rash    REVIEW OF SYSTEMS:   ROS As per history of present illness. All pertinent systems were reviewed above. Constitutional, HEENT, cardiovascular, respiratory, GI, GU, musculoskeletal, neuro, psychiatric, endocrine, integumentary and hematologic systems were reviewed and are otherwise negative/unremarkable except for positive findings mentioned above in the HPI.   MEDICATIONS AT HOME:  Prior to Admission medications   Medication Sig Start Date End Date Taking? Authorizing Provider  amphetamine-dextroamphetamine (ADDERALL XR) 30 MG 24 hr capsule Take 1 capsule (30 mg total) by mouth daily. 07/06/22   Gwyneth Sprout, FNP  busPIRone (BUSPAR) 7.5 MG tablet Take 1 tablet (7.5 mg total) by mouth 3 (three) times daily. Patient not taking: Reported on 07/14/2022 03/24/22   Gwyneth Sprout, FNP  diazepam (VALIUM) 2 MG tablet Take 1 tablet (2 mg total) by mouth every 6 (six) hours as needed for anxiety. 07/06/22   Gwyneth Sprout, FNP  FLUoxetine (PROZAC) 20 MG tablet Take 1 tablet (20 mg total) by mouth daily. Patient not taking: Reported on 07/14/2022 02/03/22   Gwyneth Sprout, FNP  ondansetron (ZOFRAN) 4 MG tablet Take 1 tablet (4 mg total) by mouth every 8 (eight) hours as needed for nausea or vomiting. 08/04/22   Gwyneth Sprout, FNP  Semaglutide-Weight Management 1.7 MG/0.75ML SOAJ Inject 1.7 mg into the skin once a week. 08/04/22   Gwyneth Sprout, FNP  SUMAtriptan (IMITREX) 100 MG tablet Take 1 tablet (100 mg total) by mouth every 2 (two) hours as needed for migraine. May repeat in 2 hours if headache persists or recurs. 02/03/22   Gwyneth Sprout, FNP      VITAL SIGNS:  Blood pressure (!) 112/51, pulse 96, temperature 98.8 F (37.1 C), temperature source Oral, resp. rate 18, height '5\' 7"'$  (1.702 m), weight 86.2 kg, last menstrual period 07/20/2022, SpO2 98 %.  PHYSICAL EXAMINATION:  Physical Exam  GENERAL:  31 y.o.-year-old Caucasian female patient lying in the bed with no acute distress.  EYES: Pupils equal, round, reactive to light and accommodation. No scleral icterus. Extraocular muscles intact.  HEENT: Head atraumatic, normocephalic. Oropharynx with dry mucous membranes and tongue and nasopharynx clear.  NECK:  Supple, no jugular venous distention. No thyroid enlargement, no tenderness.  LUNGS: Normal breath sounds bilaterally, no wheezing, rales,rhonchi or crepitation. No use of accessory muscles of respiration.  CARDIOVASCULAR: Regular rate and rhythm, S1, S2 normal. No murmurs, rubs, or gallops.  ABDOMEN: Soft, nondistended, nontender. Bowel sounds present. No organomegaly or mass.  EXTREMITIES: No pedal edema, cyanosis, or clubbing.  NEUROLOGIC: Cranial nerves II through XII are intact. Muscle strength 5/5 in all extremities. Sensation intact. Gait not checked.  PSYCHIATRIC: The patient is alert and oriented x 3.  Normal affect and good eye contact. SKIN: No obvious rash, lesion, or ulcer.    LABORATORY PANEL:   CBC Recent Labs  Lab 08/06/22 0951  WBC 17.5*  HGB 17.1*  HCT 48.9*  PLT 363   ------------------------------------------------------------------------------------------------------------------  Chemistries  Recent Labs  Lab 08/06/22 0951 08/06/22 1519  NA 141 138  K 3.6 3.4*  CL 102 103  CO2 19* 25  GLUCOSE 127* 94  BUN 23* 18  CREATININE 0.87 0.70  CALCIUM 10.9* 9.4  AST 30  --   ALT 20  --   ALKPHOS 55  --   BILITOT 1.5*  --    ------------------------------------------------------------------------------------------------------------------  Cardiac Enzymes No results for input(s): "TROPONINI" in the last 168 hours. ------------------------------------------------------------------------------------------------------------------  RADIOLOGY:  CT Abdomen Pelvis W Contrast  Result Date: 08/06/2022 CLINICAL DATA:  Nausea vomiting EXAM: CT ABDOMEN AND PELVIS WITH CONTRAST TECHNIQUE: Multidetector CT imaging of the abdomen and pelvis was performed using the standard protocol following bolus administration of intravenous contrast. RADIATION DOSE REDUCTION: This exam was performed according to the departmental dose-optimization program which includes automated exposure control, adjustment  of the mA and/or kV according to patient size and/or use of iterative reconstruction technique. CONTRAST:  160m OMNIPAQUE IOHEXOL 300 MG/ML  SOLN COMPARISON:  None Available. FINDINGS: Lower chest: No acute abnormality. Hepatobiliary: No focal liver abnormality is seen. No gallstones, gallbladder wall thickening, or biliary dilatation. Pancreas: Unremarkable. No pancreatic ductal dilatation or surrounding inflammatory changes. Spleen: Normal in size without focal abnormality. Adrenals/Urinary Tract: Adrenal glands are unremarkable. Kidneys are normal, without renal calculi, focal lesion, or hydronephrosis. Bladder is unremarkable. Stomach/Bowel: Stomach is within normal  limits. Appendix appears normal. No evidence of bowel wall thickening, distention, or inflammatory changes. Vascular/Lymphatic: No significant vascular findings are present. No enlarged abdominal or pelvic lymph nodes. Reproductive: No adnexal mass. Endometrial versus sub mucosal mass along the right uterine corpus, series 2, image 86. Other: No abdominal wall hernia or abnormality. No abdominopelvic ascites. Musculoskeletal: No acute or significant osseous findings. IMPRESSION: 1. No CT evidence for acute intra-abdominal or pelvic abnormality. 2. Possible submucosal versus endometrial mass along the right uterine corpus. Suggest correlation with nonemergent pelvic ultrasound Electronically Signed   By: KDonavan FoilM.D.   On: 08/06/2022 18:45   DG Chest Portable 1 View  Result Date: 08/06/2022 CLINICAL DATA:  Shortness of breath EXAM: PORTABLE CHEST 1 VIEW COMPARISON:  None Available. FINDINGS: The heart size and mediastinal contours are within normal limits. Both lungs are clear. The visualized skeletal structures are unremarkable. IMPRESSION: Negative. Electronically Signed   By: KRolm BaptiseM.D.   On: 08/06/2022 10:41      IMPRESSION AND PLAN:  Assessment and Plan: * Intractable nausea and vomiting - The patient will be a medical telemetry observation bed. - This is likely secondary to Ozempic that is stopped. - We will continue hydration with IV normal saline with added potassium chloride. - We will place her on scheduled IV Reglan. - We will add IV PPI therapy with Protonix. - She will be on clear liquids to be advanced as tolerated..   Hypokalemia - Potassium will be replaced and magnesium level will be checked.  Anxiety and depression - We will continue Valium.  ADHD - We will continue Adderall.  Migraine - She will be placed on as needed Imitrex.   DVT prophylaxis: Lovenox.  Advanced Care Planning:  Code Status: full code.  Family Communication:  The plan of care was  discussed in details with the patient (and family). I answered all questions. The patient agreed to proceed with the above mentioned plan. Further management will depend upon hospital course. Disposition Plan: Back to previous home environment Consults called: none.  All the records are reviewed and case discussed with ED provider.  Status is: Observation   I certify that at the time of admission, it is my clinical judgment that the patient will require  hospital care extending less than 2 midnights.                            Dispo: The patient is from: Home              Anticipated d/c is to: Home              Patient currently is not medically stable to d/c.              Difficult to place patient: No  JChristel MormonM.D on 08/06/2022 at 9:18 PM  [facility]   From 7 PM-7 AM, contact night-coverage [email or web address]  CC: Primary care physician; Gwyneth Sprout, FNP

## 2022-08-06 NOTE — Assessment & Plan Note (Signed)
-   Potassium will be replaced and magnesium level will be checked. ?

## 2022-08-06 NOTE — Telephone Encounter (Signed)
Reason for Disposition  [1] Drinking very little AND [2] dehydration suspected (e.g., no urine > 12 hours, very dry mouth, very lightheaded)  Answer Assessment - Initial Assessment Questions 1. VOMITING SEVERITY: "How many times have you vomited in the past 24 hours?"     - MILD:  1 - 2 times/day    - MODERATE: 3 - 5 times/day, decreased oral intake without significant weight loss or symptoms of dehydration    - SEVERE: 6 or more times/day, vomits everything or nearly everything, with significant weight loss, symptoms of dehydration      Susie, mother on line with [place].   Yesterday she vomited and she has a rash on face and neck.   She has started the shots on Wed. She came home because she was vomiting.   I think it's due to the shot.  Farin in background.    She started the [facility]. 2. ONSET: "When did the vomiting begin?"      Wed. Started it.    Thur. Morning woke up with sore throat, vomiting, and a rash on face on neck and face.   Sore throat but no fever.   3. FLUIDS: "What fluids or food have you vomited up today?" "Have you been able to keep any fluids down?"     Can't keep any fluids or food down since Wed 4. ABDOMEN PAIN: "Are your having any abdomen pain?" If Yes : "How bad is it and what does it feel like?" (e.g., crampy, dull, intermittent, constant)      She is dehydrated and her feet are cramping. 5. DIARRHEA: "Is there any diarrhea?" If Yes, ask: "How many times today?"      No diarrhea 6. CONTACTS: "Is there anyone else in the family with the same symptoms?"      Not asked 7. CAUSE: "What do you think is causing your vomiting?"     The Wegovy shots 8. HYDRATION STATUS: "Any signs of dehydration?" (e.g., dry mouth [not only dry lips], too weak to stand) "When did you last urinate?"     She is hardly peeing.    Can't keep anything down.    Dizzy when gets up.    I instructed them to go to the ED at this point.   She is in the background vomiting and can't hardly talk which is  why her mother is on the line. 9. OTHER SYMPTOMS: "Do you have any other symptoms?" (e.g., fever, headache, vertigo, vomiting blood or coffee grounds, recent head injury)     Dizziness when she gets up.   Weak.   Can't keep anything down food or water.   Denies diarrhea or fever.    Does have a sore throat.   Also has a rash on her face and neck since starting the [facility]. 10. PREGNANCY: "Is there any chance you are pregnant?" "When was your last menstrual period?"       Not asked  Protocols used: Vomiting-A-AH

## 2022-08-07 DIAGNOSIS — D259 Leiomyoma of uterus, unspecified: Secondary | ICD-10-CM | POA: Diagnosis not present

## 2022-08-07 DIAGNOSIS — E876 Hypokalemia: Secondary | ICD-10-CM | POA: Diagnosis not present

## 2022-08-07 DIAGNOSIS — R112 Nausea with vomiting, unspecified: Secondary | ICD-10-CM | POA: Diagnosis not present

## 2022-08-07 LAB — CBC
HCT: 42.6 % (ref 36.0–46.0)
Hemoglobin: 14.5 g/dL (ref 12.0–15.0)
MCH: 30.3 pg (ref 26.0–34.0)
MCHC: 34 g/dL (ref 30.0–36.0)
MCV: 89.1 fL (ref 80.0–100.0)
Platelets: 212 10*3/uL (ref 150–400)
RBC: 4.78 MIL/uL (ref 3.87–5.11)
RDW: 12.5 % (ref 11.5–15.5)
WBC: 7.1 10*3/uL (ref 4.0–10.5)
nRBC: 0 % (ref 0.0–0.2)

## 2022-08-07 LAB — BASIC METABOLIC PANEL
Anion gap: 10 (ref 5–15)
BUN: 13 mg/dL (ref 6–20)
CO2: 24 mmol/L (ref 22–32)
Calcium: 9.1 mg/dL (ref 8.9–10.3)
Chloride: 105 mmol/L (ref 98–111)
Creatinine, Ser: 0.66 mg/dL (ref 0.44–1.00)
GFR, Estimated: >60 mL/min (ref >60)
Glucose, Bld: 92 mg/dL (ref 70–99)
Potassium: 3.3 mmol/L — ABNORMAL LOW (ref 3.5–5.1)
Sodium: 139 mmol/L (ref 135–145)

## 2022-08-07 LAB — HIV ANTIBODY (ROUTINE TESTING W REFLEX): HIV Screen 4th Generation wRfx: NONREACTIVE

## 2022-08-07 MED ORDER — POTASSIUM CHLORIDE CRYS ER 20 MEQ PO TBCR
40.0000 meq | EXTENDED_RELEASE_TABLET | Freq: Once | ORAL | Status: AC
  Administered 2022-08-07: 40 meq via ORAL
  Filled 2022-08-07: qty 2

## 2022-08-07 NOTE — Progress Notes (Signed)
Patient discharged to home with all belongings. Discharge instructions reviewed with patient.

## 2022-08-07 NOTE — Plan of Care (Signed)

## 2022-08-07 NOTE — Discharge Summary (Signed)
Physician Discharge Summary  GENESIA CASLIN MVE:[id number] DOB: 02-23-1991 DOA: 08/06/2022  PCP: Gwyneth Sprout, FNP  Admit date: 08/06/2022 Discharge date: 08/07/2022  Admitted From: home  Disposition:  home   Recommendations for Outpatient Follow-up:  Follow up with PCP in 1-2 weeks F/u w/ GYN to get pelvic US to evaluate for possible uterine fibroid(s)  Home Health:no  Equipment/Devices:  Discharge Condition: stable  CODE STATUS: full  Diet recommendation: Regular  Brief/Interim Summary: HPI was taken from Dr. Sidney Ace: [name] is a 31 y.o. Caucasian female with medical history significant for anxiety and depression as well as migraine, who presented to emergency room with a [place] intractable nausea and vomiting since Wednesday night.  The patient took Ozempic prior to that for weight loss.  She denies any diarrhea.  She denies any bilious vomitus or hematemesis.  She admits to chills but denies any fever no abdominal pain or melena or bright red blood per rectum.  No dysuria, oliguria or hematuria or flank pain.  She denies any chest pain or palpitations.  No cough or wheezing or dyspnea.   ED Course: When she came to the ER, BP was 130/90 with heart rate 105 respiratory rate of 39 and later with hydration heart rate was 101 with respiratory rate of 17.  Labs revealed a BUN of 23 and later 18 and potassium of 3.6 and later mild hypokalemia with a potassium of 3.4 with otherwise unremarkable BMP.  Seems 10.9 and later 9.4 anion gap 20 and later 10.  LFTs showed total protein of 9.4 with albumin of 5.8 and total bili 1.5.  High sensitive troponin I was 2 twice and CBC showed leukocytosis 17.5 with neutrophilia and hemoconcentration.  Serum pregnancy test was negative UA showed 100 protein and more than 160 ketones and 0-5 WBCs with few bacteria. EKG as reviewed by me :  EKG showed sinus tachycardia with rate of 120 with poor R wave progression. Imaging: Portable chest x-ray showed no  acute cardiopulmonary disease.  Abdominal pelvic CT scan revealed possible submucosal versus endometrial mass along the right uterine corpus suggesting correlation with nonemergent pelvic ultrasound.  There is no CT evidence for acute intra abdominal or pelvic abnormality.   The patient was given 1 L bolus of IV lactated Ringer twice, 4 mg of IV Zofran, 12.5 mg of IV Phenergan and 2.5 mg of IV Inapsine.  She will be admitted to a medical telemetry observation bed for further evaluation and management.   As per Dr. Jimmye Norman 08/07/22: Pt's vomiting has resolved by the time I started seeing the pt but c/o intermittent nausea. The nausea improved w/ zofran and pt was tolerating a po diet prior to d/c. Pt was home dose of ozempic was d/c. Of note, pt was found to have a possible submucosal vs endometrial mass on CT abd/pelvis which is likely a fibroid concerning pt's age and will need to have an outpatient pelvic US to further evaluate. Pt verbalized her understanding   Discharge Diagnoses:  Principal Problem:   Intractable nausea and vomiting Active Problems:   Hypokalemia   ADHD   Anxiety and depression   Migraine  Intractable nausea and vomiting: etiology unclear, possible secondary to ozempic use. Continue on IVFs. Continue on PPI. Zofran prn. Vomiting resolved and nausea improved today   Possible submucosal vs endometrial mass: along right uterine corpus. ?fibroid. Will need to f/u outpatient w/ GYN for a pelvic US. Pt verbalized her understanding   Hypokalemia: potassium ordered  Anxiety: severity unknown. Valium prn   ADHD: continue on home dose of adderall    Migraine: sumatriptan prn   Discharge Instructions  Discharge Instructions     Diet general   Complete by: As directed    Discharge instructions   Complete by: As directed    F/u w/ PCP in 1-2 weeks   Discharge instructions   Complete by: As directed    F/u w/ GYN to get a pelvic ultrasound for evaluate for likely  uterine fibroid   Increase activity slowly   Complete by: As directed       Allergies as of 08/07/2022       Reactions   Penicillins Rash        Medication List     STOP taking these medications    busPIRone 7.5 MG tablet Commonly known as: BUSPAR   FLUoxetine 20 MG tablet Commonly known as: PROZAC   Semaglutide-Weight Management 1.7 MG/0.75ML Soaj       TAKE these medications    amphetamine-dextroamphetamine 30 MG 24 hr capsule Commonly known as: Adderall XR Take 1 capsule (30 mg total) by mouth daily.   diazepam 2 MG tablet Commonly known as: Valium Take 1 tablet (2 mg total) by mouth every 6 (six) hours as needed for anxiety.   ondansetron 4 MG tablet Commonly known as: Zofran Take 1 tablet (4 mg total) by mouth every 8 (eight) hours as needed for nausea or vomiting.   SUMAtriptan 100 MG tablet Commonly known as: Imitrex Take 1 tablet (100 mg total) by mouth every 2 (two) hours as needed for migraine. May repeat in 2 hours if headache persists or recurs.        Allergies  Allergen Reactions   Penicillins Rash    Consultations:    Procedures/Studies: CT Abdomen Pelvis W Contrast  Result Date: 08/06/2022 CLINICAL DATA:  Nausea vomiting EXAM: CT ABDOMEN AND PELVIS WITH CONTRAST TECHNIQUE: Multidetector CT imaging of the abdomen and pelvis was performed using the standard protocol following bolus administration of intravenous contrast. RADIATION DOSE REDUCTION: This exam was performed according to the departmental dose-optimization program which includes automated exposure control, adjustment of the mA and/or kV according to patient size and/or use of iterative reconstruction technique. CONTRAST:  173m OMNIPAQUE IOHEXOL 300 MG/ML  SOLN COMPARISON:  None Available. FINDINGS: Lower chest: No acute abnormality. Hepatobiliary: No focal liver abnormality is seen. No gallstones, gallbladder wall thickening, or biliary dilatation. Pancreas: Unremarkable. No  pancreatic ductal dilatation or surrounding inflammatory changes. Spleen: Normal in size without focal abnormality. Adrenals/Urinary Tract: Adrenal glands are unremarkable. Kidneys are normal, without renal calculi, focal lesion, or hydronephrosis. Bladder is unremarkable. Stomach/Bowel: Stomach is within normal limits. Appendix appears normal. No evidence of bowel wall thickening, distention, or inflammatory changes. Vascular/Lymphatic: No significant vascular findings are present. No enlarged abdominal or pelvic lymph nodes. Reproductive: No adnexal mass. Endometrial versus sub mucosal mass along the right uterine corpus, series 2, image 86. Other: No abdominal wall hernia or abnormality. No abdominopelvic ascites. Musculoskeletal: No acute or significant osseous findings. IMPRESSION: 1. No CT evidence for acute intra-abdominal or pelvic abnormality. 2. Possible submucosal versus endometrial mass along the right uterine corpus. Suggest correlation with nonemergent pelvic ultrasound Electronically Signed   By: KDonavan FoilM.D.   On: 08/06/2022 18:45   DG Chest Portable 1 View  Result Date: 08/06/2022 CLINICAL DATA:  Shortness of breath EXAM: PORTABLE CHEST 1 VIEW COMPARISON:  None Available. FINDINGS: The heart size and mediastinal contours are  within normal limits. Both lungs are clear. The visualized skeletal structures are unremarkable. IMPRESSION: Negative. Electronically Signed   By: Rolm Baptise M.D.   On: 08/06/2022 10:41   (Echo, Carotid, EGD, Colonoscopy, ERCP)    Subjective: Pt c/o intermittent nausea that improves w/ zofran   Discharge Exam: Vitals:   08/07/22 0440 08/07/22 0828  BP: (!) 105/53 98/62  Pulse: 82 86  Resp: 16 14  Temp: 97.7 F (36.5 C) 98.5 F (36.9 C)  SpO2: 96% 93%   Vitals:   08/06/22 1944 08/06/22 2004 08/07/22 0440 08/07/22 0828  BP:  (!) 112/51 (!) 105/53 98/62  Pulse:  96 82 86  Resp:  '18 16 14  '$ Temp: (!) 100.6 F (38.1 C) 98.8 F (37.1 C) 97.7 F  (36.5 C) 98.5 F (36.9 C)  TempSrc: Oral Oral Oral Oral  SpO2:  98% 96% 93%  Weight:      Height:        General: Pt is alert, awake, not in acute distress Cardiovascular: S1/S2 +, no rubs, no gallops Respiratory: CTA bilaterally, no wheezing, no rhonchi Abdominal: Soft, NT,obese, bowel sounds + Extremities: no edema, no cyanosis    The results of significant diagnostics from this hospitalization (including imaging, microbiology, ancillary and laboratory) are listed below for reference.     Microbiology: Recent Results (from the past 240 hour(s))  SARS Coronavirus 2 by RT PCR (hospital order, performed in [facility] hospital lab) *cepheid single result test* Anterior Nasal Swab     Status: None   Collection Time: 08/06/22  8:35 PM   Specimen: Anterior Nasal Swab  Result Value Ref Range Status   SARS Coronavirus 2 by RT PCR NEGATIVE NEGATIVE Final    Comment: (NOTE) SARS-CoV-2 target nucleic acids are NOT DETECTED.  The SARS-CoV-2 RNA is generally detectable in upper and lower respiratory specimens during the acute phase of infection. The lowest concentration of SARS-CoV-2 viral copies this assay can detect is 250 copies / mL. A negative result does not preclude SARS-CoV-2 infection and should not be used as the sole basis for treatment or other patient management decisions.  A negative result may occur with improper specimen collection / handling, submission of specimen other than nasopharyngeal swab, presence of viral mutation(s) within the areas targeted by this assay, and inadequate number of viral copies (<250 copies / mL). A negative result must be combined with clinical observations, patient history, and epidemiological information.  Fact Sheet for Patients:   https://www.patel.info/  Fact Sheet for Healthcare Providers: https://hall.com/  This test is not yet approved or  cleared by the [place] FDA and has been  authorized for detection and/or diagnosis of SARS-CoV-2 by FDA under an Emergency Use Authorization (EUA).  This EUA will remain in effect (meaning this test can be used) for the duration of the COVID-19 declaration under Section 564(b)(1) of the Act, 21 U.S.C. section 360bbb-3(b)(1), unless the authorization is terminated or revoked sooner.  Performed at [facility], [address] [place], [place] [address]      Labs: BNP (last 3 results) No results for input(s): "BNP" in the last 8760 hours. Basic Metabolic Panel: Recent Labs  Lab 08/06/22 0951 08/06/22 1519 08/07/22 0431  NA 141 138 139  K 3.6 3.4* 3.3*  CL 102 103 105  CO2 19* 25 24  GLUCOSE 127* 94 92  BUN 23* 18 13  CREATININE 0.87 0.70 0.66  CALCIUM 10.9* 9.4 9.1  MG  --  2.0  --  Liver Function Tests: Recent Labs  Lab 08/06/22 0951  AST 30  ALT 20  ALKPHOS 55  BILITOT 1.5*  PROT 9.4*  ALBUMIN 5.8*   Recent Labs  Lab 08/06/22 0951  LIPASE 26   No results for input(s): "AMMONIA" in the last 168 hours. CBC: Recent Labs  Lab 08/06/22 0951 08/07/22 0431  WBC 17.5* 7.1  NEUTROABS 15.5*  --   HGB 17.1* 14.5  HCT 48.9* 42.6  MCV 86.9 89.1  PLT 363 212   Cardiac Enzymes: No results for input(s): "CKTOTAL", "CKMB", "CKMBINDEX", "TROPONINI" in the last 168 hours. BNP: Invalid input(s): "POCBNP" CBG: No results for input(s): "GLUCAP" in the last 168 hours. D-Dimer No results for input(s): "DDIMER" in the last 72 hours. Hgb A1c No results for input(s): "HGBA1C" in the last 72 hours. Lipid Profile No results for input(s): "CHOL", "HDL", "LDLCALC", "TRIG", "CHOLHDL", "LDLDIRECT" in the last 72 hours. Thyroid function studies No results for input(s): "TSH", "T4TOTAL", "T3FREE", "THYROIDAB" in the last 72 hours.  Invalid input(s): "FREET3" Anemia work up No results for input(s): "VITAMINB12", "FOLATE", "FERRITIN", "TIBC", "IRON", "RETICCTPCT" in the last 72 hours. Urinalysis     Component Value Date/Time   COLORURINE YELLOW (A) 08/06/2022 1018   APPEARANCEUR HAZY (A) 08/06/2022 1018   APPEARANCEUR Clear 10/23/2014 1748   LABSPEC 1.025 08/06/2022 1018   LABSPEC 1.025 10/23/2014 1748   PHURINE 5.5 08/06/2022 1018   GLUCOSEU NEGATIVE 08/06/2022 1018   GLUCOSEU Negative 10/23/2014 1748   HGBUR NEGATIVE 08/06/2022 1018   BILIRUBINUR SMALL (A) 08/06/2022 1018   BILIRUBINUR Negative 10/23/2014 1748   KETONESUR >160 (A) 08/06/2022 1018   PROTEINUR 100 (A) 08/06/2022 1018   NITRITE NEGATIVE 08/06/2022 1018   LEUKOCYTESUR NEGATIVE 08/06/2022 1018   LEUKOCYTESUR Negative 10/23/2014 1748   Sepsis Labs Recent Labs  Lab 08/06/22 0951 08/07/22 0431  WBC 17.5* 7.1   Microbiology Recent Results (from the past 240 hour(s))  SARS Coronavirus 2 by RT PCR (hospital order, performed in [facility] lab) *cepheid single result test* Anterior Nasal Swab     Status: None   Collection Time: 08/06/22  8:35 PM   Specimen: Anterior Nasal Swab  Result Value Ref Range Status   SARS Coronavirus 2 by RT PCR NEGATIVE NEGATIVE Final    Comment: (NOTE) SARS-CoV-2 target nucleic acids are NOT DETECTED.  The SARS-CoV-2 RNA is generally detectable in upper and lower respiratory specimens during the acute phase of infection. The lowest concentration of SARS-CoV-2 viral copies this assay can detect is 250 copies / mL. A negative result does not preclude SARS-CoV-2 infection and should not be used as the sole basis for treatment or other patient management decisions.  A negative result may occur with improper specimen collection / handling, submission of specimen other than nasopharyngeal swab, presence of viral mutation(s) within the areas targeted by this assay, and inadequate number of viral copies (<250 copies / mL). A negative result must be combined with clinical observations, patient history, and epidemiological information.  Fact Sheet for Patients:    https://www.patel.info/  Fact Sheet for Healthcare Providers: https://hall.com/  This test is not yet approved or  cleared by the [place] FDA and has been authorized for detection and/or diagnosis of SARS-CoV-2 by FDA under an Emergency Use Authorization (EUA).  This EUA will remain in effect (meaning this test can be used) for the duration of the COVID-19 declaration under Section 564(b)(1) of the Act, 21 U.S.C. section 360bbb-3(b)(1), unless the authorization is terminated or revoked  sooner.  Performed at [facility], [address], [place], [place] [address]      Time coordinating discharge: Over 30 minutes  SIGNED:   Wyvonnia Dusky, MD  [facility] 08/07/2022, 3:50 PM Pager   If 7PM-7AM, please contact night-coverage [email or web address]

## 2022-08-07 NOTE — Plan of Care (Signed)
  Problem: Education: Goal: Knowledge of General Education information will improve Description: Including pain rating scale, medication(s)/side effects and non-pharmacologic comfort measures 08/07/2022 1738 by Evelena Peat, RN Outcome: Completed/Met 08/07/2022 1045 by Evelena Peat, RN Outcome: Progressing   Problem: Health Behavior/Discharge Planning: Goal: Ability to manage health-related needs will improve 08/07/2022 1738 by Evelena Peat, RN Outcome: Completed/Met 08/07/2022 1045 by Evelena Peat, RN Outcome: Progressing   Problem: Clinical Measurements: Goal: Ability to maintain clinical measurements within normal limits will improve 08/07/2022 1738 by Evelena Peat, RN Outcome: Completed/Met 08/07/2022 1045 by Evelena Peat, RN Outcome: Progressing Goal: Will remain free from infection 08/07/2022 1738 by Evelena Peat, RN Outcome: Completed/Met 08/07/2022 1045 by Evelena Peat, RN Outcome: Progressing Goal: Diagnostic test results will improve 08/07/2022 1738 by Evelena Peat, RN Outcome: Completed/Met 08/07/2022 1045 by Evelena Peat, RN Outcome: Progressing Goal: Respiratory complications will improve 08/07/2022 1738 by Evelena Peat, RN Outcome: Completed/Met 08/07/2022 1045 by Evelena Peat, RN Outcome: Progressing Goal: Cardiovascular complication will be avoided 08/07/2022 1738 by Evelena Peat, RN Outcome: Completed/Met 08/07/2022 1045 by Evelena Peat, RN Outcome: Progressing

## 2022-08-14 ENCOUNTER — Other Ambulatory Visit: Payer: Self-pay | Admitting: Family Medicine

## 2022-08-14 DIAGNOSIS — F410 Panic disorder [episodic paroxysmal anxiety] without agoraphobia: Secondary | ICD-10-CM

## 2022-08-14 DIAGNOSIS — F900 Attention-deficit hyperactivity disorder, predominantly inattentive type: Secondary | ICD-10-CM

## 2022-08-16 MED ORDER — DIAZEPAM 2 MG PO TABS: 2.0000 mg | ORAL_TABLET | Freq: Four times a day (QID) | ORAL | 0 refills | Status: DC | PRN

## 2022-08-16 MED ORDER — AMPHETAMINE-DEXTROAMPHET ER 30 MG PO CP24: 30.0000 mg | ORAL_CAPSULE | Freq: Every day | ORAL | 0 refills | Status: DC

## 2022-08-18 ENCOUNTER — Telehealth: Payer: Self-pay | Admitting: Family Medicine

## 2022-08-18 ENCOUNTER — Other Ambulatory Visit: Payer: Self-pay | Admitting: Family Medicine

## 2022-08-18 DIAGNOSIS — F900 Attention-deficit hyperactivity disorder, predominantly inattentive type: Secondary | ICD-10-CM

## 2022-08-18 NOTE — Telephone Encounter (Signed)
PA was created. Waiting on decision. Patient can pay out of pocket also.

## 2022-08-18 NOTE — Telephone Encounter (Signed)
Pt states the pharmacy has her amphetamine-dextroamphetamine (ADDERALL XR) 30 MG 24 hr capsule Has her Rx on hold, and it needs a prior auth.  The pharmacy said the dr needs to contact pharmacy.  [place], [place]

## 2022-08-18 NOTE — Telephone Encounter (Signed)
PA was created. Just waiting on response.

## 2022-09-22 ENCOUNTER — Ambulatory Visit: Payer: BC Managed Care – PPO | Admitting: Family Medicine

## 2022-09-27 ENCOUNTER — Ambulatory Visit: Payer: BC Managed Care – PPO | Admitting: Family Medicine

## 2022-09-27 NOTE — Progress Notes (Unsigned)
Established patient visit   Patient: Cynthia Glover   DOB: October 25, 1991   31 y.o. Female  MRN: [id number] Visit Date: 09/29/2022  Today's healthcare provider: Gwyneth Sprout, FNP  Re Introduced to nurse practitioner role and practice setting.  All questions answered.  Discussed provider/patient relationship and expectations.  I,Uthman Mroczkowski J Kallee Nam,acting as a scribe for Gwyneth Sprout, FNP.,have documented all relevant documentation on the behalf of Gwyneth Sprout, FNP,as directed by  Gwyneth Sprout, FNP while in the presence of Gwyneth Sprout, FNP.   Chief Complaint  Patient presents with   Migraine    States imitrex is working for migraines, has no complaints.    ADHD    Has no complains of adderall, no side effects.    Back Pain    Patient complains of back pain between shoulder blades, going from neck down to tailbone.    Anxiety   Subjective    HPI HPI     Migraine    Additional comments: States imitrex is working for migraines, has no complaints.         ADHD    Additional comments: Has no complains of adderall, no side effects.         Back Pain    Additional comments: Patient complains of back pain between shoulder blades, going from neck down to tailbone.       Last edited by Smitty Knudsen, CMA on 09/29/2022  9:21 AM.      Anxiety, Follow-up  She was last seen for anxiety 3 months ago. Changes made at last visit include no changes.   She reports excellent compliance with treatment. She reports excellent tolerance of treatment. She is not having side effects.   She feels her anxiety is mild and Improved since last visit.  Symptoms: No chest pain No difficulty concentrating  No dizziness Yes fatigue  No feelings of losing control Yes insomnia  No irritable No palpitations  Yes panic attacks Yes racing thoughts  No shortness of breath No sweating  No tremors/shakes    GAD-7 Results    02/03/2022    1:52 PM  GAD-7 Generalized Anxiety Disorder  Screening Tool  1. Feeling Nervous, Anxious, or on Edge 3  2. Not Being Able to Stop or Control Worrying 3  3. Worrying Too Much About Different Things 3  4. Trouble Relaxing 2  5. Being So Restless it's Hard To Sit Still 1  6. Becoming Easily Annoyed or Irritable 3  7. Feeling Afraid As If Something Awful Might Happen 2  Total GAD-7 Score 17  Difficulty At Work, Home, or Getting  Along With Others? Very difficult    PHQ-9 Scores    09/29/2022    9:21 AM 07/14/2022    2:25 PM 02/03/2022    1:49 PM  PHQ9 SCORE ONLY  PHQ-9 Total Score '12 5 16    '$ ---------------------------------------------------------------------------------------------------   Medications: Outpatient Medications Prior to Visit  Medication Sig   ondansetron (ZOFRAN) 4 MG tablet Take 1 tablet (4 mg total) by mouth every 8 (eight) hours as needed for nausea or vomiting.   [DISCONTINUED] amphetamine-dextroamphetamine (ADDERALL XR) 30 MG 24 hr capsule Take 1 capsule (30 mg total) by mouth daily. Due for virtual/in office visit to discuss further use.   [DISCONTINUED] diazepam (VALIUM) 2 MG tablet Take 1 tablet (2 mg total) by mouth every 6 (six) hours as needed for anxiety. Due for follow up office visit to discuss.   [  DISCONTINUED] SUMAtriptan (IMITREX) 100 MG tablet Take 1 tablet (100 mg total) by mouth every 2 (two) hours as needed for migraine. May repeat in 2 hours if headache persists or recurs.   No facility-administered medications prior to visit.    Review of Systems    Objective    BP 109/76 (BP Location: Right Arm, Patient Position: Sitting, Cuff Size: Normal)   Pulse 78   Resp 16   Ht '5\' 7"'$  (1.702 m)   Wt 166 lb (75.3 kg)   SpO2 99%   BMI 26.00 kg/m   BP Readings from Last 3 Encounters:  09/29/22 109/76  08/07/22 98/62  07/14/22 117/82   Wt Readings from Last 3 Encounters:  09/29/22 166 lb (75.3 kg)  08/06/22 190 lb (86.2 kg)  07/14/22 198 lb (89.8 kg)   Physical Exam Vitals and nursing  note reviewed.  Constitutional:      General: She is not in acute distress.    Appearance: Normal appearance. She is overweight. She is not ill-appearing, toxic-appearing or diaphoretic.  HENT:     Head: Normocephalic and atraumatic.  Cardiovascular:     Rate and Rhythm: Normal rate and regular rhythm.     Pulses: Normal pulses.     Heart sounds: Normal heart sounds. No murmur heard.    No friction rub. No gallop.  Pulmonary:     Effort: Pulmonary effort is normal. No respiratory distress.     Breath sounds: Normal breath sounds. No stridor. No wheezing, rhonchi or rales.  Chest:     Chest wall: No tenderness.  Musculoskeletal:        General: Tenderness present. No swelling, deformity or signs of injury. Normal range of motion.       Back:     Right lower leg: No edema.     Left lower leg: No edema.  Skin:    General: Skin is warm and dry.     Capillary Refill: Capillary refill takes less than 2 seconds.     Coloration: Skin is not jaundiced or pale.     Findings: No bruising, erythema, lesion or rash.  Neurological:     General: No focal deficit present.     Mental Status: She is alert and oriented to person, place, and time. Mental status is at baseline.     Cranial Nerves: No cranial nerve deficit.     Sensory: No sensory deficit.     Motor: No weakness.     Coordination: Coordination normal.  Psychiatric:        Mood and Affect: Mood normal.        Behavior: Behavior normal.        Thought Content: Thought content normal.        Judgment: Judgment normal.     No results found for any visits on 09/29/22.  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    Chronic, worsening Not improved with weight loss Concerned for weight of breasts causing back pain; worried it will worsen with age Use of Aleve back to assist; does not always remove pain Working on posture correction; however, 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       Relevant Orders   Ambulatory referral to Plastic Surgery   Macromastia    Chronic, not improved with weight loss Currently wearing 38 EEE bras; but typically wears two sports bras to assist with back pain Complaints of mid upper back pain; not relieved with use of NSAIDs Notes popping in her back can assist with pain relief Complaints of skin irritation and indentation to shoulders from weight of breasts and pendulous presentation Wishes to be consulted with plastics for possible reduction if it will assist with back pain       Relevant Orders   Ambulatory referral to Plastic Surgery   Panic attacks    Chronic, improving Request for continued use of valium to assist Patient is aware of risks of benzo medication use to include increased sedation, respiratory suppression, falls, dependence and cardiovascular events.  Patient would like to continue treatment as benefit determined to outweigh risk.         Relevant Medications   diazepam (VALIUM) 2 MG tablet   Return in about 6 months (around 03/30/2023) for annual examination.     Vonna Kotyk, FNP, have reviewed all documentation for this visit. The documentation on 09/29/22 for the exam, diagnosis, procedures, and orders are all accurate and complete.  Gwyneth Sprout, [place] [phone] (phone) [phone] (fax)  [place]

## 2022-09-29 ENCOUNTER — Encounter: Payer: Self-pay | Admitting: Family Medicine

## 2022-09-29 ENCOUNTER — Ambulatory Visit (INDEPENDENT_AMBULATORY_CARE_PROVIDER_SITE_OTHER): Payer: BC Managed Care – PPO | Admitting: Family Medicine

## 2022-09-29 VITALS — BP 109/76 | HR 78 | Resp 16 | Ht 67.0 in | Wt 166.0 lb

## 2022-09-29 DIAGNOSIS — G8929 Other chronic pain: Secondary | ICD-10-CM

## 2022-09-29 DIAGNOSIS — G43101 Migraine with aura, not intractable, with status migrainosus: Secondary | ICD-10-CM | POA: Diagnosis not present

## 2022-09-29 DIAGNOSIS — F410 Panic disorder [episodic paroxysmal anxiety] without agoraphobia: Secondary | ICD-10-CM

## 2022-09-29 DIAGNOSIS — M546 Pain in thoracic spine: Secondary | ICD-10-CM | POA: Diagnosis not present

## 2022-09-29 DIAGNOSIS — F900 Attention-deficit hyperactivity disorder, predominantly inattentive type: Secondary | ICD-10-CM | POA: Diagnosis not present

## 2022-09-29 DIAGNOSIS — N62 Hypertrophy of breast: Secondary | ICD-10-CM | POA: Diagnosis not present

## 2022-09-29 MED ORDER — AMPHETAMINE-DEXTROAMPHET ER 30 MG PO CP24: 30.0000 mg | ORAL_CAPSULE | Freq: Every day | ORAL | 0 refills | Status: DC

## 2022-09-29 MED ORDER — DIAZEPAM 2 MG PO TABS: 2.0000 mg | ORAL_TABLET | Freq: Two times a day (BID) | ORAL | 5 refills | Status: DC | PRN

## 2022-09-29 MED ORDER — SUMATRIPTAN SUCCINATE 100 MG PO TABS: 100.0000 mg | ORAL_TABLET | ORAL | 3 refills | Status: DC | PRN

## 2022-09-29 NOTE — Assessment & Plan Note (Signed)
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

## 2022-09-29 NOTE — Assessment & Plan Note (Signed)
Chronic, improving Request for continued use of valium to assist Patient is aware of risks of benzo medication use to include increased sedation, respiratory suppression, falls, dependence and cardiovascular events.  Patient would like to continue treatment as benefit determined to outweigh risk.

## 2022-09-29 NOTE — Assessment & Plan Note (Signed)
Chronic, stable  Continue Adderall XR 30 mg to assist Continue every 6 month visits

## 2022-09-29 NOTE — Assessment & Plan Note (Signed)
Chronic, not improved with weight loss Currently wearing 38 EEE bras; but typically wears two sports bras to assist with back pain Complaints of mid upper back pain; not relieved with use of NSAIDs Notes popping in her back can assist with pain relief Complaints of skin irritation and indentation to shoulders from weight of breasts and pendulous presentation Wishes to be consulted with plastics for possible reduction if it will assist with back pain

## 2022-09-29 NOTE — Assessment & Plan Note (Signed)
Chronic, intermittent Use of imitrex to assist Denies concerns

## 2022-10-01 ENCOUNTER — Encounter: Payer: Self-pay | Admitting: Family Medicine

## 2022-10-12 DIAGNOSIS — N858 Other specified noninflammatory disorders of uterus: Secondary | ICD-10-CM | POA: Insufficient documentation

## 2022-11-04 ENCOUNTER — Ambulatory Visit: Payer: Medicaid Other | Admitting: Plastic Surgery

## 2022-11-04 ENCOUNTER — Encounter: Payer: Self-pay | Admitting: Plastic Surgery

## 2022-11-04 VITALS — BP 132/88 | HR 95 | Ht 67.0 in | Wt 160.8 lb

## 2022-11-04 DIAGNOSIS — Z6825 Body mass index (BMI) 25.0-25.9, adult: Secondary | ICD-10-CM

## 2022-11-04 DIAGNOSIS — M546 Pain in thoracic spine: Secondary | ICD-10-CM

## 2022-11-04 DIAGNOSIS — N62 Hypertrophy of breast: Secondary | ICD-10-CM | POA: Diagnosis not present

## 2022-11-04 DIAGNOSIS — M542 Cervicalgia: Secondary | ICD-10-CM | POA: Diagnosis not present

## 2022-11-04 NOTE — Progress Notes (Signed)
Referring Provider Gwyneth Sprout, [place],  [place] [address]   CC:  Chief Complaint  Patient presents with   Advice Only      Cynthia Glover is an 31 y.o. female.  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  She is interested in breast reduction.  She states that she has tried multiple modalities including massage and nonsteroidal therapy for her upper back and neck pain.  She felt that her pain may be due to the large size of her breast because of her weight.  She has lost close to 70 pounds but still has upper back and neck pain.  Allergies  Allergen Reactions   Penicillins Rash    Outpatient Encounter Medications as of 11/04/2022  Medication Sig   amphetamine-dextroamphetamine (ADDERALL XR) 30 MG 24 hr capsule Take 1 capsule (30 mg total) by mouth daily.   diazepam (VALIUM) 2 MG tablet Take 1 tablet (2 mg total) by mouth every 12 (twelve) hours as needed for anxiety.   SUMAtriptan (IMITREX) 100 MG tablet Take 1 tablet (100 mg total) by mouth every 2 (two) hours as needed for migraine. May repeat in 2 hours if headache persists or recurs.   [DISCONTINUED] ondansetron (ZOFRAN) 4 MG tablet Take 1 tablet (4 mg total) by mouth every 8 (eight) hours as needed for nausea or vomiting. (Patient not taking: Reported on 11/04/2022)   No facility-administered encounter medications on file as of 11/04/2022.     Past Medical History:  Diagnosis Date   Anxiety    Depression     No past surgical history on file.  Family History  Problem Relation Age of Onset   Depression Father    Diabetes Father    Depression Brother    Anxiety disorder Brother     Social History   Social History Narrative   Not on file     Review of Systems General: Denies fevers, chills, weight loss CV: Denies chest pain, shortness of breath, palpitations Breast: Large breasts which she feels are  contributing to her upper back and neck pain and causing her to stoop forward.  Physical Exam    11/04/2022   11:31 AM 09/29/2022    9:22 AM 08/07/2022    8:28 AM  Vitals with BMI  Height '5\' 7"'$  '5\' 7"'$    Weight 160 lbs 13 oz 166 lbs   BMI 73.41 93.79   Systolic 024 097 98  Diastolic 88 76 62  Pulse 95 78 86    General:  No acute distress,  Alert and oriented, Non-Toxic, Normal speech and affect Breast: The patient has moderately large breasts with grade 3 ptosis.  There are no nipple abnormalities and no nipple discharge. Mammogram: Has not begun mammographic screening Assessment/Plan Symptomatic macromastia: Patient has very large breasts but as I discussed with her I am not convinced that they are the sole cause of her upper back and neck pain.  I still believe that she would benefit from a breast reduction as her breasts are out of proportion to her body size.  We discussed the procedure including the location of the incisions and the unpredictable nature of scarring.  We discussed the risks of bleeding, infection, seroma formation.  We discussed the specific risks of nipple loss due to nipple ischemia.  We also discussed the fact that there will be some changes in feeling in the nipples and in the  breast skin postoperatively this may include loss of feeling in either the nipples or the breast skin.  She understands that there will be drains postoperatively and will need to be removed the next day.  We also discussed the physical limitations including no heavy lifting greater than 20 pounds no vigorous activity and no submerging the incisions in water for 6 weeks.  She may return to work when she feels comfortable, probably 1 week postoperatively.  All questions were answered to her satisfaction I will schedule her for surgery at her request. I believe I can safely remove 450 g per breast as she is requesting that I make her on the smaller side.  Cynthia Glover 11/04/2022, 12:50 PM

## 2022-12-06 NOTE — Progress Notes (Signed)
I,Connie R Striblin,acting as a Education administrator for Gwyneth Sprout, FNP.,have documented all relevant documentation on the behalf of Gwyneth Sprout, FNP,as directed by  Gwyneth Sprout, FNP while in the presence of Gwyneth Sprout, FNP.  Established patient visit  Patient: Cynthia Glover   DOB: 1991/10/12   32 y.o. Female  MRN: [id number] Visit Date: 12/07/2022  Today's healthcare provider: Gwyneth Sprout, FNP  Re Introduced to nurse practitioner role and practice setting.  All questions answered.  Discussed provider/patient relationship and expectations.  Subjective    HPI  Follow up for Medication management   The patient was last seen for this 5 months ago. Pt was taking wegovey 1.5 due to pharmacy availability, discontinued due to side effects.   Pt is requesting a lower does, now that pharmacy has it in stock.  -----------------------------------------------------------------------------------------  Medications: Outpatient Medications Prior to Visit  Medication Sig   amphetamine-dextroamphetamine (ADDERALL XR) 30 MG 24 hr capsule Take 1 capsule (30 mg total) by mouth daily.   [DISCONTINUED] diazepam (VALIUM) 2 MG tablet Take 1 tablet (2 mg total) by mouth every 12 (twelve) hours as needed for anxiety.   [DISCONTINUED] SUMAtriptan (IMITREX) 100 MG tablet Take 1 tablet (100 mg total) by mouth every 2 (two) hours as needed for migraine. May repeat in 2 hours if headache persists or recurs.   No facility-administered medications prior to visit.   Review of Systems    Objective    BP 117/81 (BP Location: Right Arm, Patient Position: Sitting, Cuff Size: Normal)   Pulse 90   Temp 98.3 F (36.8 C) (Oral)   Wt 162 lb 14.4 oz (73.9 kg)   SpO2 100%   BMI 25.51 kg/m   Physical Exam Vitals and nursing note reviewed.  Constitutional:      General: She is not in acute distress.    Appearance: Normal appearance. She is overweight. She is not ill-appearing, toxic-appearing or diaphoretic.   HENT:     Head: Normocephalic and atraumatic.  Cardiovascular:     Rate and Rhythm: Normal rate and regular rhythm.     Pulses: Normal pulses.     Heart sounds: Normal heart sounds. No murmur heard.    No friction rub. No gallop.  Pulmonary:     Effort: Pulmonary effort is normal. No respiratory distress.     Breath sounds: Normal breath sounds. No stridor. No wheezing, rhonchi or rales.  Chest:     Chest wall: No tenderness.  Musculoskeletal:        General: No swelling, tenderness, deformity or signs of injury. Normal range of motion.     Right lower leg: No edema.     Left lower leg: No edema.  Skin:    General: Skin is warm and dry.     Capillary Refill: Capillary refill takes less than 2 seconds.     Coloration: Skin is not jaundiced or pale.     Findings: No bruising, erythema, lesion or rash.  Neurological:     General: No focal deficit present.     Mental Status: She is alert and oriented to person, place, and time. Mental status is at baseline.     Cranial Nerves: No cranial nerve deficit.     Sensory: No sensory deficit.     Motor: No weakness.     Coordination: Coordination normal.  Psychiatric:        Mood and Affect: Mood normal.        Behavior:  Behavior normal.        Thought Content: Thought content normal.        Judgment: Judgment normal.    No results found for any visits on 12/07/22.  Assessment & Plan     Problem List Items Addressed This Visit       Cardiovascular and Mediastinum   Migraine with aura and with status migrainosus, not intractable    Chronic, stable Continue imitrex 100 mg to assist Denies concerns with current regimen       Relevant Medications   SUMAtriptan (IMITREX) 100 MG tablet     Other   ADHD    Chronic, stable Continue Adderall XR 30 mg PDMP reviewed 10/23; unable to load today at time of OV       Overweight - Primary    Chronic, stable Wishes to restart Wegovy to assist  Advised that given current BMI of  25.5 despite previous hx of obesity that she may not be covered given lapse of use due to dose availability      Panic attacks    Chronic, improved Pt is aware of risks of psychoactive medication use to include increased sedation, respiratory suppression, falls, extrapyramidal movements,  dependence and cardiovascular events.  Pt would like to continue treatment as benefit determined to outweigh risk.   Continue low dose valium 2 mg up to BID PRN Unable to review PDMP d/t system error at this time       Relevant Medications   diazepam (VALIUM) 2 MG tablet   Return in about 6 months (around 06/07/2023) for annual examination.     Vonna Kotyk, FNP, have reviewed all documentation for this visit. The documentation on 12/07/22 for the exam, diagnosis, procedures, and orders are all accurate and complete.  Gwyneth Sprout, [place] [phone] (phone) [phone] (fax)  [place]

## 2022-12-07 ENCOUNTER — Encounter: Payer: Self-pay | Admitting: *Deleted

## 2022-12-07 ENCOUNTER — Ambulatory Visit (INDEPENDENT_AMBULATORY_CARE_PROVIDER_SITE_OTHER): Payer: BC Managed Care – PPO | Admitting: Family Medicine

## 2022-12-07 ENCOUNTER — Encounter: Payer: Self-pay | Admitting: Family Medicine

## 2022-12-07 VITALS — BP 117/81 | HR 90 | Temp 98.3°F | Wt 162.9 lb

## 2022-12-07 DIAGNOSIS — G43101 Migraine with aura, not intractable, with status migrainosus: Secondary | ICD-10-CM | POA: Diagnosis not present

## 2022-12-07 DIAGNOSIS — F900 Attention-deficit hyperactivity disorder, predominantly inattentive type: Secondary | ICD-10-CM | POA: Diagnosis not present

## 2022-12-07 DIAGNOSIS — E669 Obesity, unspecified: Secondary | ICD-10-CM | POA: Insufficient documentation

## 2022-12-07 DIAGNOSIS — F410 Panic disorder [episodic paroxysmal anxiety] without agoraphobia: Secondary | ICD-10-CM

## 2022-12-07 DIAGNOSIS — E663 Overweight: Secondary | ICD-10-CM | POA: Diagnosis not present

## 2022-12-07 MED ORDER — SEMAGLUTIDE-WEIGHT MANAGEMENT 0.5 MG/0.5ML ~~LOC~~ SOAJ: 0.5000 mg | SUBCUTANEOUS | 0 refills | Status: DC

## 2022-12-07 MED ORDER — SUMATRIPTAN SUCCINATE 100 MG PO TABS: 100.0000 mg | ORAL_TABLET | ORAL | 3 refills | Status: DC | PRN

## 2022-12-07 MED ORDER — DIAZEPAM 2 MG PO TABS: 2.0000 mg | ORAL_TABLET | Freq: Two times a day (BID) | ORAL | 5 refills | Status: DC | PRN

## 2022-12-07 NOTE — Assessment & Plan Note (Addendum)
Chronic, stable Wishes to restart Wegovy to assist  Advised that given current BMI of 25.5 despite previous hx of obesity that she may not be covered given lapse of use due to dose availability

## 2022-12-07 NOTE — Assessment & Plan Note (Signed)
Chronic, improved Pt is aware of risks of psychoactive medication use to include increased sedation, respiratory suppression, falls, extrapyramidal movements,  dependence and cardiovascular events.  Pt would like to continue treatment as benefit determined to outweigh risk.   Continue low dose valium 2 mg up to BID PRN Unable to review PDMP d/t system error at this time

## 2022-12-07 NOTE — Assessment & Plan Note (Addendum)
Chronic, stable Continue Adderall XR 30 mg PDMP reviewed 10/23; unable to load today at time of OV

## 2022-12-07 NOTE — Assessment & Plan Note (Signed)
Chronic, stable Continue imitrex 100 mg to assist Denies concerns with current regimen

## 2022-12-09 ENCOUNTER — Telehealth: Payer: Self-pay

## 2022-12-09 NOTE — Telephone Encounter (Signed)
Copied from [place] [id number]. Topic: General - Other >> Dec 09, 2022  2:41 PM Everette C wrote: Reason for CRM: The patient has been directed to contact their PCP and request completion of prior authorization for their Semaglutide-Weight Management 0.5 MG/0.5ML SOAJ [[id number]]  prescription   [facility]ase contact the patient further if needed

## 2022-12-10 NOTE — Telephone Encounter (Signed)
PA was denied. Letter in media tab.

## 2022-12-10 NOTE — Telephone Encounter (Signed)
PA started today. Waiting on insurance.

## 2022-12-13 ENCOUNTER — Telehealth: Payer: Self-pay | Admitting: *Deleted

## 2022-12-13 NOTE — Telephone Encounter (Signed)
Patient has bcbs [place] primary which will require PT before an auth can be submitted for breast reduction.  Notified Dr. Lovena Le and Joss, asked that order be entered and patient be notified or PT Needs.

## 2022-12-14 NOTE — Addendum Note (Signed)
Addended by: Lilli Light on: 12/14/2022 01:44 PM   Modules accepted: Orders

## 2023-01-05 ENCOUNTER — Ambulatory Visit: Payer: BC Managed Care – PPO | Attending: Family Medicine

## 2023-01-05 DIAGNOSIS — N62 Hypertrophy of breast: Secondary | ICD-10-CM | POA: Insufficient documentation

## 2023-01-05 DIAGNOSIS — M546 Pain in thoracic spine: Secondary | ICD-10-CM | POA: Insufficient documentation

## 2023-01-05 DIAGNOSIS — R293 Abnormal posture: Secondary | ICD-10-CM | POA: Diagnosis not present

## 2023-01-05 DIAGNOSIS — M5459 Other low back pain: Secondary | ICD-10-CM | POA: Insufficient documentation

## 2023-01-05 DIAGNOSIS — M6281 Muscle weakness (generalized): Secondary | ICD-10-CM | POA: Insufficient documentation
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
## 2023-01-11 ENCOUNTER — Encounter: Payer: BC Managed Care – PPO | Admitting: Physical Therapy

## 2023-01-13 ENCOUNTER — Ambulatory Visit: Payer: BC Managed Care – PPO

## 2023-01-13 DIAGNOSIS — M546 Pain in thoracic spine: Secondary | ICD-10-CM

## 2023-01-13 DIAGNOSIS — M5459 Other low back pain: Secondary | ICD-10-CM | POA: Diagnosis not present

## 2023-01-13 DIAGNOSIS — M6281 Muscle weakness (generalized): Secondary | ICD-10-CM

## 2023-01-13 DIAGNOSIS — R293 Abnormal posture: Secondary | ICD-10-CM

## 2023-01-13 DIAGNOSIS — N62 Hypertrophy of breast: Secondary | ICD-10-CM | POA: Diagnosis not present
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
## 2023-01-18 ENCOUNTER — Ambulatory Visit: Payer: BC Managed Care – PPO | Attending: Plastic Surgery

## 2023-01-18 DIAGNOSIS — M6281 Muscle weakness (generalized): Secondary | ICD-10-CM | POA: Insufficient documentation

## 2023-01-18 DIAGNOSIS — M542 Cervicalgia: Secondary | ICD-10-CM | POA: Diagnosis not present

## 2023-01-18 DIAGNOSIS — R293 Abnormal posture: Secondary | ICD-10-CM | POA: Diagnosis not present

## 2023-01-18 DIAGNOSIS — M5459 Other low back pain: Secondary | ICD-10-CM | POA: Diagnosis not present

## 2023-01-18 DIAGNOSIS — M546 Pain in thoracic spine: Secondary | ICD-10-CM | POA: Diagnosis not present
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
## 2023-01-24 ENCOUNTER — Ambulatory Visit: Payer: BC Managed Care – PPO | Admitting: Physical Therapy

## 2023-01-24 DIAGNOSIS — M546 Pain in thoracic spine: Secondary | ICD-10-CM

## 2023-01-24 DIAGNOSIS — M6281 Muscle weakness (generalized): Secondary | ICD-10-CM

## 2023-01-24 DIAGNOSIS — M5459 Other low back pain: Secondary | ICD-10-CM

## 2023-01-24 DIAGNOSIS — R293 Abnormal posture: Secondary | ICD-10-CM | POA: Diagnosis not present

## 2023-01-24 DIAGNOSIS — M542 Cervicalgia: Secondary | ICD-10-CM
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
## 2023-01-26 ENCOUNTER — Encounter: Payer: BC Managed Care – PPO | Admitting: Physical Therapy

## 2023-02-01 ENCOUNTER — Ambulatory Visit: Payer: BC Managed Care – PPO

## 2023-02-01 DIAGNOSIS — M6281 Muscle weakness (generalized): Secondary | ICD-10-CM

## 2023-02-01 DIAGNOSIS — M542 Cervicalgia: Secondary | ICD-10-CM | POA: Diagnosis not present

## 2023-02-01 DIAGNOSIS — R293 Abnormal posture: Secondary | ICD-10-CM | POA: Diagnosis not present

## 2023-02-01 DIAGNOSIS — M546 Pain in thoracic spine: Secondary | ICD-10-CM | POA: Diagnosis not present

## 2023-02-01 DIAGNOSIS — M5459 Other low back pain: Secondary | ICD-10-CM
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
## 2023-02-09 ENCOUNTER — Ambulatory Visit: Payer: BC Managed Care – PPO | Admitting: Physical Therapy

## 2023-02-09 DIAGNOSIS — M5459 Other low back pain: Secondary | ICD-10-CM | POA: Diagnosis not present

## 2023-02-09 DIAGNOSIS — M542 Cervicalgia: Secondary | ICD-10-CM

## 2023-02-09 DIAGNOSIS — R293 Abnormal posture: Secondary | ICD-10-CM | POA: Diagnosis not present

## 2023-02-09 DIAGNOSIS — M6281 Muscle weakness (generalized): Secondary | ICD-10-CM | POA: Diagnosis not present

## 2023-02-09 DIAGNOSIS — M546 Pain in thoracic spine: Secondary | ICD-10-CM | POA: Diagnosis not present
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
## 2023-02-10 ENCOUNTER — Encounter: Payer: Self-pay | Admitting: Physical Therapy

## 2023-02-14 ENCOUNTER — Ambulatory Visit: Payer: BC Managed Care – PPO | Attending: Plastic Surgery

## 2023-02-16 ENCOUNTER — Encounter: Payer: BC Managed Care – PPO | Admitting: Physical Therapy

## 2023-02-21 ENCOUNTER — Telehealth: Payer: Self-pay

## 2023-02-21 ENCOUNTER — Ambulatory Visit: Payer: BC Managed Care – PPO | Admitting: Physical Therapy

## 2023-02-21 NOTE — Telephone Encounter (Signed)
Patient states having abnormal bleeding starting on Date of onset: 02/15/23 . Patient states the bleeding is spotting, light.  Patient states moderate severe abdominal pain and cramping along with pain in her back. Patientis pregnant based off of a home pregnancy test with lmp 01/06/23  ([redacted]w[redacted]d)   PLAN:  Patient has been scheduled for a nurse visit to confirm pregnancy for 02/22/23 at 4:15. Depending on results, 2 Beta labs may be needed and/or ultrasound to determine viability.

## 2023-02-22 ENCOUNTER — Ambulatory Visit: Payer: BC Managed Care – PPO

## 2023-02-28 ENCOUNTER — Ambulatory Visit: Payer: BC Managed Care – PPO | Admitting: Physical Therapy

## 2023-03-02 DIAGNOSIS — N912 Amenorrhea, unspecified: Secondary | ICD-10-CM | POA: Diagnosis not present

## 2023-03-02 DIAGNOSIS — O418X10 Other specified disorders of amniotic fluid and membranes, first trimester, not applicable or unspecified: Secondary | ICD-10-CM | POA: Diagnosis not present

## 2023-03-02 DIAGNOSIS — O468X1 Other antepartum hemorrhage, first trimester: Secondary | ICD-10-CM | POA: Diagnosis not present

## 2023-03-07 ENCOUNTER — Encounter: Payer: BC Managed Care – PPO | Admitting: Physical Therapy

## 2023-03-10 ENCOUNTER — Telehealth: Payer: Self-pay | Admitting: Plastic Surgery

## 2023-03-10 NOTE — Telephone Encounter (Signed)
[employer] Pending Ref# [id number] for BIL Breast Reduction. Faxed clinicals to [phone]

## 2023-03-11 ENCOUNTER — Telehealth: Payer: Self-pay | Admitting: *Deleted

## 2023-03-11 NOTE — Telephone Encounter (Signed)
LVM for [facility] [phone] that patient has tried heat/cold therapies per PT notes

## 2023-03-14 ENCOUNTER — Encounter: Payer: BC Managed Care – PPO | Admitting: Physical Therapy

## 2023-03-14 ENCOUNTER — Telehealth: Payer: Self-pay | Admitting: Plastic Surgery

## 2023-03-14 NOTE — Telephone Encounter (Signed)
LVM and my chart message that insurance was approved and scheduler would be reaching out soon to get her scheduled.  If any questions to please contact our office.

## 2023-03-21 DIAGNOSIS — Z3483 Encounter for supervision of other normal pregnancy, third trimester: Secondary | ICD-10-CM | POA: Insufficient documentation

## 2023-03-28 ENCOUNTER — Encounter: Payer: BC Managed Care – PPO | Admitting: Physical Therapy

## 2023-04-06 DIAGNOSIS — O468X1 Other antepartum hemorrhage, first trimester: Secondary | ICD-10-CM | POA: Diagnosis not present

## 2023-04-06 DIAGNOSIS — O418X10 Other specified disorders of amniotic fluid and membranes, first trimester, not applicable or unspecified: Secondary | ICD-10-CM | POA: Diagnosis not present

## 2023-04-06 DIAGNOSIS — Z3481 Encounter for supervision of other normal pregnancy, first trimester: Secondary | ICD-10-CM | POA: Diagnosis not present

## 2023-04-25 ENCOUNTER — Telehealth: Payer: Self-pay | Admitting: Plastic Surgery

## 2023-04-25 NOTE — Telephone Encounter (Signed)
Pt. Called. She saw Dr. Ladona Ridgel for a breast reduction consult on 11/04/2022.  She received a call that her insurance has approved procedure.  She is currently pregnant and due in December.  She would like to know if she will still be able to have the procedure in the beginning of 2025.  Please call her at [phone].

## 2023-04-25 NOTE — Telephone Encounter (Signed)
I spoke with Cynthia Glover and explained she would need to be at minimum 3 months past breastfeeding and any milk production before we could consider rescheduling her surgery,. Also that we will need to resubmit her case to insurance for authorization at that time. She verbalized understanding of the above and stated she will call when she is done breastfeeding and ready to schedule surgery.

## 2023-06-06 DIAGNOSIS — Z3482 Encounter for supervision of other normal pregnancy, second trimester: Secondary | ICD-10-CM | POA: Diagnosis not present

## 2023-07-19 DIAGNOSIS — N760 Acute vaginitis: Secondary | ICD-10-CM | POA: Diagnosis not present

## 2023-08-02 DIAGNOSIS — Z3482 Encounter for supervision of other normal pregnancy, second trimester: Secondary | ICD-10-CM | POA: Diagnosis not present

## 2023-08-04 ENCOUNTER — Other Ambulatory Visit: Payer: Self-pay | Admitting: Family Medicine

## 2023-08-04 DIAGNOSIS — F900 Attention-deficit hyperactivity disorder, predominantly inattentive type: Secondary | ICD-10-CM

## 2023-08-15 DIAGNOSIS — O26893 Other specified pregnancy related conditions, third trimester: Secondary | ICD-10-CM | POA: Diagnosis not present

## 2023-08-15 DIAGNOSIS — R102 Pelvic and perineal pain: Secondary | ICD-10-CM | POA: Diagnosis not present

## 2023-08-15 DIAGNOSIS — N898 Other specified noninflammatory disorders of vagina: Secondary | ICD-10-CM | POA: Diagnosis not present

## 2023-08-29 DIAGNOSIS — Z3483 Encounter for supervision of other normal pregnancy, third trimester: Secondary | ICD-10-CM | POA: Diagnosis not present

## 2023-08-29 DIAGNOSIS — O26843 Uterine size-date discrepancy, third trimester: Secondary | ICD-10-CM | POA: Insufficient documentation

## 2023-09-13 ENCOUNTER — Inpatient Hospital Stay
Admission: EM | Admit: 2023-09-13 | Discharge: 2023-09-15 | DRG: 786 | Disposition: A | Discharge status: Home / Self Care | Payer: BC Managed Care – PPO | Attending: Obstetrics and Gynecology | Admitting: Obstetrics and Gynecology

## 2023-09-13 ENCOUNTER — Other Ambulatory Visit: Payer: Self-pay

## 2023-09-13 ENCOUNTER — Inpatient Hospital Stay: Payer: BC Managed Care – PPO | Admitting: Anesthesiology

## 2023-09-13 ENCOUNTER — Encounter: Payer: Self-pay | Admitting: Obstetrics and Gynecology

## 2023-09-13 ENCOUNTER — Telehealth: Payer: Self-pay

## 2023-09-13 DIAGNOSIS — O3663X0 Maternal care for excessive fetal growth, third trimester, not applicable or unspecified: Secondary | ICD-10-CM | POA: Diagnosis not present

## 2023-09-13 DIAGNOSIS — Z833 Family history of diabetes mellitus: Secondary | ICD-10-CM | POA: Diagnosis not present

## 2023-09-13 DIAGNOSIS — Z87891 Personal history of nicotine dependence: Secondary | ICD-10-CM

## 2023-09-13 DIAGNOSIS — O458X3 Other premature separation of placenta, third trimester: Secondary | ICD-10-CM | POA: Diagnosis not present

## 2023-09-13 DIAGNOSIS — Z2911 Encounter for prophylactic immunotherapy for respiratory syncytial virus (RSV): Secondary | ICD-10-CM | POA: Diagnosis not present

## 2023-09-13 DIAGNOSIS — Z880 Allergy status to penicillin: Secondary | ICD-10-CM | POA: Diagnosis not present

## 2023-09-13 DIAGNOSIS — D259 Leiomyoma of uterus, unspecified: Secondary | ICD-10-CM | POA: Diagnosis present

## 2023-09-13 DIAGNOSIS — Z3A34 34 weeks gestation of pregnancy: Secondary | ICD-10-CM

## 2023-09-13 DIAGNOSIS — Z1152 Encounter for screening for COVID-19: Secondary | ICD-10-CM | POA: Diagnosis not present

## 2023-09-13 DIAGNOSIS — O3413 Maternal care for benign tumor of corpus uteri, third trimester: Secondary | ICD-10-CM | POA: Diagnosis present

## 2023-09-13 DIAGNOSIS — Z23 Encounter for immunization: Secondary | ICD-10-CM | POA: Diagnosis not present

## 2023-09-13 DIAGNOSIS — O3443 Maternal care for other abnormalities of cervix, third trimester: Secondary | ICD-10-CM | POA: Diagnosis present

## 2023-09-13 DIAGNOSIS — O403XX0 Polyhydramnios, third trimester, not applicable or unspecified: Secondary | ICD-10-CM | POA: Diagnosis not present

## 2023-09-13 DIAGNOSIS — O365930 Maternal care for other known or suspected poor fetal growth, third trimester, not applicable or unspecified: Secondary | ICD-10-CM | POA: Diagnosis not present

## 2023-09-13 DIAGNOSIS — N841 Polyp of cervix uteri: Secondary | ICD-10-CM | POA: Diagnosis present

## 2023-09-13 DIAGNOSIS — Z818 Family history of other mental and behavioral disorders: Secondary | ICD-10-CM | POA: Diagnosis not present

## 2023-09-13 LAB — CBC
HCT: 35.8 % — ABNORMAL LOW (ref 36.0–46.0)
Hemoglobin: 12.7 g/dL (ref 12.0–15.0)
MCH: 31.4 pg (ref 26.0–34.0)
MCHC: 35.5 g/dL (ref 30.0–36.0)
MCV: 88.4 fL (ref 80.0–100.0)
Platelets: 183 10*3/uL (ref 150–400)
RBC: 4.05 MIL/uL (ref 3.87–5.11)
RDW: 13.2 % (ref 11.5–15.5)
WBC: 9.6 10*3/uL (ref 4.0–10.5)
nRBC: 0 % (ref 0.0–0.2)

## 2023-09-13 LAB — RESP PANEL BY RT-PCR (RSV, FLU A&B, COVID)  RVPGX2
Influenza A by PCR: NEGATIVE
Influenza B by PCR: NEGATIVE
Resp Syncytial Virus by PCR: NEGATIVE
SARS Coronavirus 2 by RT PCR: NEGATIVE

## 2023-09-13 LAB — CHLAMYDIA/NGC RT PCR (ARMC ONLY)
Chlamydia Tr: NOT DETECTED
N gonorrhoeae: NOT DETECTED

## 2023-09-13 LAB — URINALYSIS, COMPLETE (UACMP) WITH MICROSCOPIC
Bilirubin Urine: NEGATIVE
Glucose, UA: NEGATIVE mg/dL
Hgb urine dipstick: NEGATIVE
Ketones, ur: NEGATIVE mg/dL
Leukocytes,Ua: NEGATIVE
Nitrite: NEGATIVE
Protein, ur: NEGATIVE mg/dL
Specific Gravity, Urine: 1.003 — ABNORMAL LOW (ref 1.005–1.030)
pH: 7 (ref 5.0–8.0)

## 2023-09-13 LAB — TYPE AND SCREEN
ABO/RH(D): O POS
Antibody Screen: NEGATIVE

## 2023-09-13 LAB — GROUP B STREP BY PCR: Group B strep by PCR: NEGATIVE

## 2023-09-13 LAB — WET PREP, GENITAL
Clue Cells Wet Prep HPF POC: NONE SEEN
Sperm: NONE SEEN
Trich, Wet Prep: NONE SEEN
WBC, Wet Prep HPF POC: <10 (ref <10)
Yeast Wet Prep HPF POC: NONE SEEN

## 2023-09-13 MED ORDER — DIPHENHYDRAMINE HCL 50 MG/ML IJ SOLN: 12.5000 mg | INTRAMUSCULAR | Status: DC | PRN

## 2023-09-13 MED ORDER — LIDOCAINE HCL (PF) 1 % IJ SOLN: 30.0000 mL | INTRAMUSCULAR | Status: DC | PRN

## 2023-09-13 MED ORDER — CLINDAMYCIN PHOSPHATE 900 MG/50ML IV SOLN: 900.0000 mg | Freq: Once | INTRAVENOUS | Status: DC

## 2023-09-13 MED ORDER — OXYCODONE-ACETAMINOPHEN 5-325 MG PO TABS: 2.0000 | ORAL_TABLET | ORAL | Status: DC | PRN

## 2023-09-13 MED ORDER — PHENYLEPHRINE 80 MCG/ML (10ML) SYRINGE FOR IV PUSH (FOR BLOOD PRESSURE SUPPORT): 80.0000 ug | PREFILLED_SYRINGE | INTRAVENOUS | Status: DC | PRN

## 2023-09-13 MED ORDER — ONDANSETRON HCL 4 MG/2ML IJ SOLN
4.0000 mg | Freq: Four times a day (QID) | INTRAMUSCULAR | Status: DC | PRN
Start: 2023-09-13 — End: 2023-09-13
  Administered 2023-09-13: 4 mg via INTRAVENOUS
  Filled 2023-09-13: qty 2

## 2023-09-13 MED ORDER — FENTANYL CITRATE (PF) 100 MCG/2ML IJ SOLN: 50.0000 ug | INTRAMUSCULAR | Status: DC | PRN

## 2023-09-13 MED ORDER — OXYCODONE-ACETAMINOPHEN 5-325 MG PO TABS: 1.0000 | ORAL_TABLET | ORAL | Status: DC | PRN

## 2023-09-13 MED ORDER — CALCIUM CARBONATE ANTACID 500 MG PO CHEW: 2.0000 | CHEWABLE_TABLET | ORAL | Status: DC | PRN

## 2023-09-13 MED ORDER — CEFAZOLIN SODIUM-DEXTROSE 1-4 GM/50ML-% IV SOLN: 1.0000 g | Freq: Three times a day (TID) | INTRAVENOUS | Status: DC

## 2023-09-13 MED ORDER — EPHEDRINE 5 MG/ML INJ: 10.0000 mg | INTRAVENOUS | Status: DC | PRN

## 2023-09-13 MED ORDER — SODIUM CHLORIDE 0.9 % IV SOLN
25.0000 mg | Freq: Three times a day (TID) | INTRAVENOUS | Status: DC | PRN
  Administered 2023-09-13 – 2023-09-14 (×2): 25 mg via INTRAVENOUS
  Filled 2023-09-13 (×3): qty 1

## 2023-09-13 MED ORDER — BETAMETHASONE SOD PHOS & ACET 6 (3-3) MG/ML IJ SUSP
12.0000 mg | INTRAMUSCULAR | Status: DC
Start: 2023-09-13 — End: 2023-09-15
  Administered 2023-09-13: 12 mg via INTRAMUSCULAR
  Filled 2023-09-13: qty 5

## 2023-09-13 MED ORDER — LACTATED RINGERS IV SOLN
INTRAVENOUS | Status: DC
  Administered 2023-09-14: 1000 mL via INTRAVENOUS

## 2023-09-13 MED ORDER — LACTATED RINGERS IV SOLN
500.0000 mL | INTRAVENOUS | Status: DC | PRN
  Administered 2023-09-13: 500 mL via INTRAVENOUS

## 2023-09-13 MED ORDER — CEFAZOLIN SODIUM-DEXTROSE 2-4 GM/100ML-% IV SOLN
2.0000 g | Freq: Once | INTRAVENOUS | Status: DC
  Filled 2023-09-13: qty 100

## 2023-09-13 MED ORDER — OXYTOCIN BOLUS FROM INFUSION: 333.0000 mL | Freq: Once | INTRAVENOUS | Status: DC

## 2023-09-13 MED ORDER — FENTANYL-BUPIVACAINE-NACL 0.5-0.125-0.9 MG/250ML-% EP SOLN
12.0000 mL/h | EPIDURAL | Status: DC | PRN
  Administered 2023-09-13 – 2023-09-14 (×2): 12 mL/h via EPIDURAL
  Filled 2023-09-13: qty 250

## 2023-09-13 MED ORDER — LIDOCAINE-EPINEPHRINE (PF) 1.5 %-1:200000 IJ SOLN
INTRAMUSCULAR | Status: DC | PRN
  Administered 2023-09-13: 3 mL via EPIDURAL

## 2023-09-13 MED ORDER — CALCIUM CARBONATE ANTACID 500 MG PO CHEW
CHEWABLE_TABLET | ORAL | Status: AC
  Administered 2023-09-13: 400 mg via ORAL
  Filled 2023-09-13: qty 2

## 2023-09-13 MED ORDER — FENTANYL-BUPIVACAINE-NACL 0.5-0.125-0.9 MG/250ML-% EP SOLN
EPIDURAL | Status: AC
  Filled 2023-09-13: qty 250

## 2023-09-13 MED ORDER — LIDOCAINE HCL (PF) 1 % IJ SOLN
INTRAMUSCULAR | Status: DC | PRN
  Administered 2023-09-13: 4 mL via SUBCUTANEOUS

## 2023-09-13 MED ORDER — SODIUM CHLORIDE 0.9 % IV SOLN
INTRAVENOUS | Status: DC | PRN
  Administered 2023-09-13: 5 mL via EPIDURAL
  Administered 2023-09-13: 4 mL via EPIDURAL

## 2023-09-13 MED ORDER — ACETAMINOPHEN 325 MG PO TABS
650.0000 mg | ORAL_TABLET | ORAL | Status: DC | PRN
Start: 2023-09-13 — End: 2023-09-14

## 2023-09-13 MED ORDER — SOD CITRATE-CITRIC ACID 500-334 MG/5ML PO SOLN: 30.0000 mL | ORAL | Status: DC | PRN

## 2023-09-13 MED ORDER — OXYTOCIN-SODIUM CHLORIDE 30-0.9 UT/500ML-% IV SOLN
2.5000 [IU]/h | INTRAVENOUS | Status: DC
  Filled 2023-09-13: qty 500

## 2023-09-13 NOTE — Anesthesia Preprocedure Evaluation (Signed)
Anesthesia Evaluation  Patient identified by MRN, date of birth, ID band Patient awake    Reviewed: Allergy & Precautions, H&P , NPO status , Patient's Chart, lab work & pertinent test results, reviewed documented beta blocker date and time   Airway Mallampati: II  TM Distance: >3 FB Neck ROM: full    Dental no notable dental hx. (+) Teeth Intact   Pulmonary neg pulmonary ROS, former smoker   Pulmonary exam normal breath sounds clear to auscultation       Cardiovascular Exercise Tolerance: Good negative cardio ROS  Rhythm:regular Rate:Normal     Neuro/Psych  Headaches PSYCHIATRIC DISORDERS Anxiety Depression       GI/Hepatic negative GI ROS, Neg liver ROS,,,  Endo/Other  negative endocrine ROSdiabetes, Well Controlled    Renal/GU      Musculoskeletal   Abdominal   Peds  Hematology negative hematology ROS (+)   Anesthesia Other Findings   Reproductive/Obstetrics (+) Pregnancy                             Anesthesia Physical Anesthesia Plan  ASA: 2  Anesthesia Plan: Epidural   Post-op Pain Management:    Induction:   PONV Risk Score and Plan:   Airway Management Planned:   Additional Equipment:   Intra-op Plan:   Post-operative Plan:   Informed Consent: I have reviewed the patients History and Physical, chart, labs and discussed the procedure including the risks, benefits and alternatives for the proposed anesthesia with the patient or authorized representative who has indicated his/her understanding and acceptance.       Plan Discussed with:   Anesthesia Plan Comments:        Anesthesia Quick Evaluation

## 2023-09-13 NOTE — Progress Notes (Signed)
FHT: from 2156 to 2222 Strip is broke however overall reassuring. Moderate variability  Baseline 130 Accelerations present 1 possible deceleration  Cynthia Glover 09/13/2023 10:32 PM

## 2023-09-13 NOTE — Consult Note (Signed)
   30 + Weeks  Neonatology Consult  Note:  At the request of the patients obstetrician Dr. Dalbert Garnet,   I met with  Ms. Golis  who is at  34.3 wks currently with preg complicated by  preterm labor with cervical change and intact membranes. We discussed betamethasone for fetal lung maturity.  We reviewed initial delivery room management, including CPAP, [place], and low but certainly possible need for intubation for surfactant administration.  We discussed feeding immaturity and need for full po intake with multiple days of good weight gain and no apnea or bradycardia before discharge.  We reviewed increased risk of jaundice, infection, and temperature instability.  She desires supplementation with preterm formula rather than DBM as a bridge until adequate milk supply is established.  Discussed likely length of stay.  Thank you for allowing [place] to participate in her care.  Please call with questions. Jodeci Rini P. Benjaman Kindler NNP-BC Neonatal Nurse Practitioner      The total length of face-to-face or floor / unit time for this encounter was 30 minutes.  Counseling and / or coordination of care was greater than fifty percent of the time.

## 2023-09-13 NOTE — Progress Notes (Signed)
Labor Progress Note  ARIANN LAURAIN is a 32 y.o. G3P2002 at [redacted]w[redacted]d by LMP admitted for Preterm labor  Subjective: Pt is comfortable with an epidural  Objective: BP 121/68   Pulse 87   Temp 98.6 F (37 C) (Oral)   Resp 18   Ht 5\' 7"  (1.702 m)   Wt 93 kg   BMI 32.11 kg/m   Fetal Assessment: Difficult tracing. Changing monitor system, to see if it works better SVE:    Dilation: 3cm  Effacement: Long  Station:  Floating  Consistency: medium  Position: middle  Membrane status: Intact Amniotic color: n/a  Labs: Lab Results  Component Value Date   WBC 9.6 09/13/2023   HGB 12.7 09/13/2023   HCT 35.8 (L) 09/13/2023   MCV 88.4 09/13/2023   PLT 183 09/13/2023    Assessment / Plan: Spontaneous labor In the office 1/50/high 1836 4/50/-3 BSUS confirmed vtx 2049 Epidural placed 2112 BMZ given 2130 3/long/high   Labor: Progressing normally Preeclampsia:   121/68 Fetal Wellbeing:   difficulty tracing, attempting to get baby on the monitor Pain Control:  Epidural I/D:   Afebrile, GBS neg, Intact Anticipated MOD:  NSVD  Cyril Mourning, CNM 09/13/2023, 9:29 PM

## 2023-09-13 NOTE — Anesthesia Procedure Notes (Signed)
Epidural Patient location during procedure: OB End time: 09/13/2023 8:49 PM  Staffing Performed: anesthesiologist   Preanesthetic Checklist Completed: patient identified, IV checked, site marked, risks and benefits discussed, surgical consent, monitors and equipment checked, pre-op evaluation and timeout performed  Epidural Patient position: sitting Prep: Betadine Patient monitoring: heart rate, continuous pulse ox and blood pressure Approach: midline Location: L4-L5 Injection technique: LOR saline  Needle:  Needle type: Tuohy  Needle gauge: 17 G Needle length: 9 cm and 9 Needle insertion depth: 6 cm Catheter type: closed end flexible Catheter size: 19 Gauge Catheter at skin depth: 12 cm Test dose: negative and 1.5% lidocaine with Epi 1:200 K  Assessment Events: blood not aspirated, no cerebrospinal fluid, injection not painful, no injection resistance, no paresthesia and negative IV test  Additional Notes   Patient tolerated the insertion well without complications.Reason for block:procedure for pain

## 2023-09-13 NOTE — Progress Notes (Signed)
Pt presents to L/D triage from office for labor eval. Pt reports feeling occasional "tightening", rated 7/10. CTX palpate mild. SVE in office 1 cm. Pt PO hydrating. Pt reports no bleeding or LOF and positive fetal movement. Pt reports no atypical discharge or urinary discomfort.  Monitors applied and assessing. Initial FHT 135. VSS. CNM notified of patient's arrival.

## 2023-09-13 NOTE — H&P (Signed)
OB History & Physical   History of Present Illness:  Chief Complaint:   HPI:  Cynthia Glover is a 32 y.o. G65P2002 female at [redacted]w[redacted]d dated by LMP.  She presents to L&D for Preterm labor with active cervical change. She was sent from the office for abdominal tightening. Ultrasound was completed at the office and indicated AFI of 41.8cm, EFW of 4th% and AC of 2nd%, and elevated S/D ratio with adequate end diastolic flow. SVE in the office was 1/50/OOP vtx.   She reports:  -active fetal movement -no leakage of fluid -no vaginal bleeding -contractions currently every 2-4 minutes  Pregnancy Issues: FGR Polyhydramnios  Cervical polyp Anxiety, depression, ADHD Uterine fibroid   Maternal Medical History:   Past Medical History:  Diagnosis Date   Anxiety    Depression     History reviewed. No pertinent surgical history.  Allergies  Allergen Reactions   Penicillins Rash    Prior to Admission medications   Medication Sig Start Date End Date Taking? Authorizing Provider  amphetamine-dextroamphetamine (ADDERALL XR) 30 MG 24 hr capsule Take 1 capsule (30 mg total) by mouth daily. 09/29/22 09/13/23 Yes Jacky Kindle, FNP  Prenatal Vit-Fe Fumarate-FA (PRENATAL MULTIVITAMIN) TABS tablet Take 1 tablet by mouth daily at 12 noon.   Yes [provider]  diazepam (VALIUM) 2 MG tablet Take 1 tablet (2 mg total) by mouth every 12 (twelve) hours as needed for anxiety. Patient not taking: Reported on 09/13/2023 12/07/22   Jacky Kindle, FNP  Semaglutide-Weight Management 0.5 MG/0.5ML SOAJ Inject 0.5 mg into the skin once a week. Patient not taking: Reported on 09/13/2023 12/07/22   Jacky Kindle, FNP  SUMAtriptan (IMITREX) 100 MG tablet Take 1 tablet (100 mg total) by mouth every 2 (two) hours as needed for migraine. May repeat in 2 hours if headache persists or recurs. Patient not taking: Reported on 09/13/2023 12/07/22   Jacky Kindle, FNP     Prenatal care site: [facility]    Social History: She  reports that she has quit smoking. She has never used smokeless tobacco. She reports that she does not drink alcohol and does not use drugs.  Family History: family history includes Anxiety disorder in her brother; Depression in her brother and father; Diabetes in her father.   Review of Systems: A full review of systems was performed and negative except as noted in the HPI.    Physical Exam:  Vital Signs: BP 121/68   Pulse 87   Temp 98.6 F (37 C) (Oral)   Resp 18   Ht 5\' 7"  (1.702 m)   Wt 93 kg   BMI 32.11 kg/m   General:   alert and cooperative  Skin:  normal  Neurologic:    Alert & oriented x 3  Lungs:    Nl effort  Heart:   regular rate and rhythm  Abdomen:  soft, non-tender; bowel sounds normal; no masses,  no organomegaly  Extremities: : non-tender, symmetric, no edema bilaterally.       Results for orders placed or performed during the hospital encounter of 09/13/23 (from the past 24 hour(s))  Wet prep, genital     Status: None   Collection Time: 09/13/23  4:52 PM   Specimen: Urine, Clean Catch  Result Value Ref Range   Yeast Wet Prep HPF POC NONE SEEN NONE SEEN   Trich, Wet Prep NONE SEEN NONE SEEN   Clue Cells Wet Prep HPF POC NONE SEEN NONE SEEN  WBC, Wet Prep HPF POC <10 <10   Sperm NONE SEEN   Urinalysis, Complete w Microscopic -Urine, Clean Catch     Status: Abnormal   Collection Time: 09/13/23  4:58 PM  Result Value Ref Range   Color, Urine STRAW (A) YELLOW   APPearance CLEAR (A) CLEAR   Specific Gravity, Urine 1.003 (L) 1.005 - 1.030   pH 7.0 5.0 - 8.0   Glucose, UA NEGATIVE NEGATIVE mg/dL   Hgb urine dipstick NEGATIVE NEGATIVE   Bilirubin Urine NEGATIVE NEGATIVE   Ketones, ur NEGATIVE NEGATIVE mg/dL   Protein, ur NEGATIVE NEGATIVE mg/dL   Nitrite NEGATIVE NEGATIVE   Leukocytes,Ua NEGATIVE NEGATIVE   RBC / HPF 0-5 0 - 5 RBC/hpf   WBC, UA 0-5 0 - 5 WBC/hpf   Bacteria, UA RARE (A) NONE SEEN   Squamous Epithelial / HPF 0-5 0  - 5 /HPF    Pertinent Results:  Prenatal Labs: Blood type/Rh O pos  Antibody screen neg  Rubella Non-Immune  Varicella Immune  RPR NR  HBsAg Neg  HIV NR  GC neg  Chlamydia neg  Genetic screening negative  1 hour GTT 107  3 hour GTT   GBS pending   FHT: FHR: 120 bpm, variability: moderate,  accelerations:  Present,  decelerations:  Absent Category/reactivity:  Category I TOCO: regular, every 2-4 minutes SVE: Dilation: 4 / Effacement (%): 50 / Station: -3       Assessment:  Cynthia Glover is a 32 y.o. G49P2002 female at [redacted]w[redacted]d with preterm labor and Poly.   Plan:  1. Admit to Labor & Delivery; consents reviewed and obtained - Dr. Dalbert Garnet notified and POC discussed  2. Fetal Well being  - Fetal Tracing: Cat I - GBS pending - Presentation: vtx confirmed by u/s   3. Routine OB: - Prenatal labs reviewed, as above - Rh pos - CBC & T&S on admit - Clear fluids, IVF  4. Preterm Labor -  Contractions by external toco in place -  Pelvis proven to 3110g -  Plan for continuous fetal monitoring  -  Maternal pain control as desired: IVPM, nitrous, regional anesthesia - Anticipate vaginal delivery  5. Post Partum Planning: - Infant feeding: Breastfeeding - Contraception: IUD - Needs rubella vaccine  - Tdap: given 09/13/23  - Flu: declined - RSV: given 09/13/23   Haroldine Laws, CNM 09/13/2023 7:25 PM

## 2023-09-14 ENCOUNTER — Encounter: Payer: Self-pay | Admitting: Obstetrics and Gynecology

## 2023-09-14 ENCOUNTER — Other Ambulatory Visit: Payer: Self-pay | Admitting: *Deleted

## 2023-09-14 ENCOUNTER — Encounter
Admission: EM | Disposition: A | Discharge status: Home / Self Care | Payer: Self-pay | Source: Home / Self Care | Attending: Obstetrics and Gynecology

## 2023-09-14 ENCOUNTER — Other Ambulatory Visit: Payer: Self-pay

## 2023-09-14 DIAGNOSIS — O409XX0 Polyhydramnios, unspecified trimester, not applicable or unspecified: Secondary | ICD-10-CM

## 2023-09-14 DIAGNOSIS — O365939 Maternal care for other known or suspected poor fetal growth, third trimester, other fetus: Secondary | ICD-10-CM

## 2023-09-14 LAB — CBC
HCT: 26.1 % — ABNORMAL LOW (ref 36.0–46.0)
Hemoglobin: 9.4 g/dL — ABNORMAL LOW (ref 12.0–15.0)
MCH: 31.9 pg (ref 26.0–34.0)
MCHC: 36 g/dL (ref 30.0–36.0)
MCV: 88.5 fL (ref 80.0–100.0)
Platelets: 166 10*3/uL (ref 150–400)
RBC: 2.95 MIL/uL — ABNORMAL LOW (ref 3.87–5.11)
RDW: 13.3 % (ref 11.5–15.5)
WBC: 14.5 10*3/uL — ABNORMAL HIGH (ref 4.0–10.5)
nRBC: 0 % (ref 0.0–0.2)

## 2023-09-14 LAB — CREATININE, SERUM
Creatinine, Ser: 0.55 mg/dL (ref 0.44–1.00)
GFR, Estimated: >60 mL/min (ref >60)

## 2023-09-14 LAB — RPR: RPR Ser Ql: NONREACTIVE

## 2023-09-14 SURGERY — Surgical Case
Anesthesia: Epidural

## 2023-09-14 MED ORDER — KETOROLAC TROMETHAMINE 30 MG/ML IJ SOLN
INTRAMUSCULAR | Status: AC
  Filled 2023-09-14: qty 1

## 2023-09-14 MED ORDER — AMPHETAMINE-DEXTROAMPHET ER 30 MG PO CP24
30.0000 mg | ORAL_CAPSULE | Freq: Every day | ORAL | Status: DC
  Administered 2023-09-15: 30 mg via ORAL
  Filled 2023-09-14: qty 1

## 2023-09-14 MED ORDER — SIMETHICONE 80 MG PO CHEW: 80.0000 mg | CHEWABLE_TABLET | ORAL | Status: DC | PRN

## 2023-09-14 MED ORDER — DIAZEPAM 2 MG PO TABS: 2.0000 mg | ORAL_TABLET | Freq: Two times a day (BID) | ORAL | Status: DC | PRN

## 2023-09-14 MED ORDER — PHENYLEPHRINE HCL (PRESSORS) 10 MG/ML IV SOLN
INTRAVENOUS | Status: DC | PRN
  Administered 2023-09-14: 160 ug via INTRAVENOUS

## 2023-09-14 MED ORDER — BUPIVACAINE IN DEXTROSE 0.75-8.25 % IT SOLN
INTRATHECAL | Status: DC | PRN
  Administered 2023-09-14: 1.2 mL via INTRATHECAL

## 2023-09-14 MED ORDER — ACETAMINOPHEN 500 MG PO TABS
1000.0000 mg | ORAL_TABLET | Freq: Four times a day (QID) | ORAL | Status: DC
  Administered 2023-09-14 – 2023-09-15 (×3): 1000 mg via ORAL
  Filled 2023-09-14 (×3): qty 2

## 2023-09-14 MED ORDER — KETOROLAC TROMETHAMINE 30 MG/ML IJ SOLN: 30.0000 mg | Freq: Four times a day (QID) | INTRAMUSCULAR | Status: DC | PRN

## 2023-09-14 MED ORDER — NALOXONE HCL 4 MG/10ML IJ SOLN: 1.0000 ug/kg/h | INTRAVENOUS | Status: DC | PRN

## 2023-09-14 MED ORDER — DEXMEDETOMIDINE HCL IN NACL 200 MCG/50ML IV SOLN
INTRAVENOUS | Status: DC | PRN
  Administered 2023-09-14: 8 ug via INTRAVENOUS

## 2023-09-14 MED ORDER — FLEET ENEMA RE ENEM: 1.0000 | ENEMA | Freq: Every day | RECTAL | Status: DC | PRN

## 2023-09-14 MED ORDER — OXYTOCIN-SODIUM CHLORIDE 30-0.9 UT/500ML-% IV SOLN
1.0000 m[IU]/min | INTRAVENOUS | Status: DC
  Administered 2023-09-14: 2 m[IU]/min via INTRAVENOUS
  Filled 2023-09-14: qty 500

## 2023-09-14 MED ORDER — OXYTOCIN-SODIUM CHLORIDE 30-0.9 UT/500ML-% IV SOLN: 2.5000 [IU]/h | INTRAVENOUS | Status: DC

## 2023-09-14 MED ORDER — WITCH HAZEL-GLYCERIN EX PADS: 1.0000 | MEDICATED_PAD | CUTANEOUS | Status: DC | PRN

## 2023-09-14 MED ORDER — PRENATAL MULTIVITAMIN CH
1.0000 | ORAL_TABLET | Freq: Every day | ORAL | Status: DC
  Administered 2023-09-15: 1 via ORAL
  Filled 2023-09-14: qty 1

## 2023-09-14 MED ORDER — SOD CITRATE-CITRIC ACID 500-334 MG/5ML PO SOLN
ORAL | Status: AC
  Filled 2023-09-14: qty 15

## 2023-09-14 MED ORDER — MENTHOL 3 MG MT LOZG: 1.0000 | LOZENGE | OROMUCOSAL | Status: DC | PRN

## 2023-09-14 MED ORDER — PHENYLEPHRINE HCL-NACL 20-0.9 MG/250ML-% IV SOLN
INTRAVENOUS | Status: AC
  Filled 2023-09-14: qty 250

## 2023-09-14 MED ORDER — ONDANSETRON HCL 4 MG/2ML IJ SOLN
4.0000 mg | Freq: Three times a day (TID) | INTRAMUSCULAR | Status: DC | PRN
Start: 2023-09-14 — End: 2023-09-15

## 2023-09-14 MED ORDER — SOD CITRATE-CITRIC ACID 500-334 MG/5ML PO SOLN
30.0000 mL | ORAL | Status: AC
  Administered 2023-09-14: 30 mL via ORAL

## 2023-09-14 MED ORDER — ACETAMINOPHEN 500 MG PO TABS
1000.0000 mg | ORAL_TABLET | Freq: Four times a day (QID) | ORAL | Status: DC
  Administered 2023-09-14: 1000 mg via ORAL
  Filled 2023-09-14: qty 2

## 2023-09-14 MED ORDER — PHENYLEPHRINE HCL-NACL 20-0.9 MG/250ML-% IV SOLN
INTRAVENOUS | Status: DC | PRN
  Administered 2023-09-14: 40 ug/min via INTRAVENOUS

## 2023-09-14 MED ORDER — KETOROLAC TROMETHAMINE 30 MG/ML IJ SOLN
30.0000 mg | Freq: Four times a day (QID) | INTRAMUSCULAR | Status: DC
Start: 2023-09-15 — End: 2023-09-16
  Administered 2023-09-15 (×2): 30 mg via INTRAVENOUS
  Filled 2023-09-14 (×2): qty 1

## 2023-09-14 MED ORDER — FERROUS SULFATE 325 (65 FE) MG PO TABS
325.0000 mg | ORAL_TABLET | Freq: Two times a day (BID) | ORAL | Status: DC
  Administered 2023-09-15: 325 mg via ORAL
  Filled 2023-09-14 (×2): qty 1

## 2023-09-14 MED ORDER — SUMATRIPTAN SUCCINATE 50 MG PO TABS: 100.0000 mg | ORAL_TABLET | ORAL | Status: DC | PRN

## 2023-09-14 MED ORDER — CEFAZOLIN SODIUM-DEXTROSE 2-4 GM/100ML-% IV SOLN
2.0000 g | INTRAVENOUS | Status: AC
  Administered 2023-09-14: 2 g via INTRAVENOUS

## 2023-09-14 MED ORDER — GABAPENTIN 300 MG PO CAPS
300.0000 mg | ORAL_CAPSULE | Freq: Every day | ORAL | Status: DC
  Administered 2023-09-14: 300 mg via ORAL
  Filled 2023-09-14: qty 1

## 2023-09-14 MED ORDER — DIPHENHYDRAMINE HCL 25 MG PO CAPS
25.0000 mg | ORAL_CAPSULE | ORAL | Status: DC | PRN
Start: 2023-09-14 — End: 2023-09-15

## 2023-09-14 MED ORDER — SENNOSIDES-DOCUSATE SODIUM 8.6-50 MG PO TABS
2.0000 | ORAL_TABLET | ORAL | Status: DC
  Administered 2023-09-14: 2 via ORAL
  Filled 2023-09-14: qty 2

## 2023-09-14 MED ORDER — BUPIVACAINE HCL (PF) 0.25 % IJ SOLN
INTRAMUSCULAR | Status: AC
  Filled 2023-09-14: qty 30

## 2023-09-14 MED ORDER — LACTATED RINGERS AMNIOINFUSION
INTRAVENOUS | Status: DC
  Filled 2023-09-14: qty 1000

## 2023-09-14 MED ORDER — DIBUCAINE (PERIANAL) 1 % EX OINT: 1.0000 | TOPICAL_OINTMENT | CUTANEOUS | Status: DC | PRN

## 2023-09-14 MED ORDER — FENTANYL CITRATE (PF) 100 MCG/2ML IJ SOLN
INTRAMUSCULAR | Status: DC | PRN
  Administered 2023-09-14: 15 ug via INTRATHECAL

## 2023-09-14 MED ORDER — OXYCODONE HCL 5 MG PO TABS
5.0000 mg | ORAL_TABLET | Freq: Four times a day (QID) | ORAL | Status: DC | PRN
  Administered 2023-09-14: 5 mg via ORAL
  Filled 2023-09-14: qty 1

## 2023-09-14 MED ORDER — NALOXONE HCL 0.4 MG/ML IJ SOLN: 0.4000 mg | INTRAMUSCULAR | Status: DC | PRN

## 2023-09-14 MED ORDER — COCONUT OIL OIL: 1.0000 | TOPICAL_OIL | Status: DC | PRN

## 2023-09-14 MED ORDER — SCOPOLAMINE 1 MG/3DAYS TD PT72: 1.0000 | MEDICATED_PATCH | Freq: Once | TRANSDERMAL | Status: DC

## 2023-09-14 MED ORDER — 0.9 % SODIUM CHLORIDE (POUR BTL) OPTIME
TOPICAL | Status: DC | PRN
  Administered 2023-09-14: 1000 mL

## 2023-09-14 MED ORDER — SIMETHICONE 80 MG PO CHEW
80.0000 mg | CHEWABLE_TABLET | Freq: Three times a day (TID) | ORAL | Status: DC
  Administered 2023-09-15 (×2): 80 mg via ORAL
  Filled 2023-09-14 (×3): qty 1

## 2023-09-14 MED ORDER — CEFAZOLIN SODIUM-DEXTROSE 2-4 GM/100ML-% IV SOLN
INTRAVENOUS | Status: AC
  Filled 2023-09-14: qty 100

## 2023-09-14 MED ORDER — KETOROLAC TROMETHAMINE 30 MG/ML IJ SOLN
INTRAMUSCULAR | Status: DC | PRN
  Administered 2023-09-14: 30 mg via INTRAVENOUS

## 2023-09-14 MED ORDER — SODIUM CHLORIDE 0.9 % IV SOLN
500.0000 mg | INTRAVENOUS | Status: AC
  Administered 2023-09-14: 500 mg via INTRAVENOUS
  Filled 2023-09-14: qty 5

## 2023-09-14 MED ORDER — BUPIVACAINE HCL (PF) 0.25 % IJ SOLN
INTRAMUSCULAR | Status: DC | PRN
  Administered 2023-09-14: 30 mL

## 2023-09-14 MED ORDER — DIPHENHYDRAMINE HCL 25 MG PO CAPS: 25.0000 mg | ORAL_CAPSULE | Freq: Four times a day (QID) | ORAL | Status: DC | PRN

## 2023-09-14 MED ORDER — BUPIVACAINE HCL (PF) 0.5 % IJ SOLN
INTRAMUSCULAR | Status: AC
  Filled 2023-09-14: qty 30

## 2023-09-14 MED ORDER — DIPHENHYDRAMINE HCL 50 MG/ML IJ SOLN: 12.5000 mg | INTRAMUSCULAR | Status: DC | PRN

## 2023-09-14 MED ORDER — FENTANYL CITRATE (PF) 100 MCG/2ML IJ SOLN
INTRAMUSCULAR | Status: AC
  Filled 2023-09-14: qty 2

## 2023-09-14 MED ORDER — SODIUM CHLORIDE 0.9% FLUSH: 3.0000 mL | INTRAVENOUS | Status: DC | PRN

## 2023-09-14 MED ORDER — ONDANSETRON HCL 4 MG/2ML IJ SOLN
INTRAMUSCULAR | Status: DC | PRN
  Administered 2023-09-14: 4 mg via INTRAVENOUS

## 2023-09-14 MED ORDER — IBUPROFEN 600 MG PO TABS
600.0000 mg | ORAL_TABLET | Freq: Four times a day (QID) | ORAL | Status: DC
Start: 2023-09-16 — End: 2023-09-16

## 2023-09-14 MED ORDER — BISACODYL 10 MG RE SUPP: 10.0000 mg | Freq: Every day | RECTAL | Status: DC | PRN

## 2023-09-14 MED ORDER — KETOROLAC TROMETHAMINE 30 MG/ML IJ SOLN
30.0000 mg | Freq: Four times a day (QID) | INTRAMUSCULAR | Status: DC | PRN
  Administered 2023-09-14: 30 mg via INTRAVENOUS
  Filled 2023-09-14: qty 1

## 2023-09-14 MED ORDER — OXYCODONE HCL 5 MG PO TABS
5.0000 mg | ORAL_TABLET | ORAL | Status: DC | PRN
  Administered 2023-09-14 – 2023-09-15 (×4): 5 mg via ORAL
  Filled 2023-09-14 (×4): qty 1

## 2023-09-14 MED ORDER — MEASLES, MUMPS & RUBELLA VAC IJ SOLR
0.5000 mL | Freq: Once | INTRAMUSCULAR | Status: DC
  Filled 2023-09-14: qty 0.5

## 2023-09-14 MED ORDER — LACTATED RINGERS IV SOLN: INTRAVENOUS | Status: DC

## 2023-09-14 MED ORDER — TERBUTALINE SULFATE 1 MG/ML IJ SOLN
0.2500 mg | Freq: Once | INTRAMUSCULAR | Status: AC | PRN
  Administered 2023-09-14: 0.25 mg via SUBCUTANEOUS
  Filled 2023-09-14: qty 1

## 2023-09-14 MED ORDER — TETANUS-DIPHTH-ACELL PERTUSSIS 5-2.5-18.5 LF-MCG/0.5 IM SUSY: 0.5000 mL | PREFILLED_SYRINGE | Freq: Once | INTRAMUSCULAR | Status: DC

## 2023-09-14 MED ORDER — ENOXAPARIN SODIUM 40 MG/0.4ML IJ SOSY
40.0000 mg | PREFILLED_SYRINGE | INTRAMUSCULAR | Status: DC
  Filled 2023-09-14: qty 0.4

## 2023-09-14 SURGICAL SUPPLY — 30 items
APL PRP STRL LF DISP 70% ISPRP (MISCELLANEOUS) ×1
CHLORAPREP W/TINT 26 (MISCELLANEOUS) ×1 IMPLANT
DRESSING TELFA 4X3 1S ST N-ADH (GAUZE/BANDAGES/DRESSINGS) IMPLANT
DRSG TELFA 3X8 NADH STRL (GAUZE/BANDAGES/DRESSINGS) ×1 IMPLANT
ELECT REM PT RETURN 9FT ADLT (ELECTROSURGICAL) ×1
ELECTRODE REM PT RTRN 9FT ADLT (ELECTROSURGICAL) ×1 IMPLANT
GAUZE SPONGE 4X4 12PLY STRL (GAUZE/BANDAGES/DRESSINGS) ×1 IMPLANT
GOWN STRL REUS W/ TWL LRG LVL3 (GOWN DISPOSABLE) ×3 IMPLANT
GOWN STRL REUS W/TWL LRG LVL3 (GOWN DISPOSABLE) ×3
MANIFOLD NEPTUNE II (INSTRUMENTS) ×1 IMPLANT
MAT PREVALON FULL STRYKER (MISCELLANEOUS) ×1 IMPLANT
NDL HYPO 25GX1X1/2 BEV (NEEDLE) ×1 IMPLANT
NEEDLE HYPO 25GX1X1/2 BEV (NEEDLE) ×1 IMPLANT
NS IRRIG 1000ML POUR BTL (IV SOLUTION) ×1 IMPLANT
PACK C SECTION AR (MISCELLANEOUS) ×1 IMPLANT
PAD OB MATERNITY 4.3X12.25 (PERSONAL CARE ITEMS) ×1 IMPLANT
PAD PREP OB/GYN DISP 24X41 (PERSONAL CARE ITEMS) ×1 IMPLANT
SCRUB CHG 4% DYNA-HEX 4OZ (MISCELLANEOUS) ×1 IMPLANT
SUT MNCRL 4-0 (SUTURE) ×1
SUT MNCRL 4-0 27XMFL (SUTURE) ×1
SUT VIC AB 0 CT1 36 (SUTURE) ×2 IMPLANT
SUT VIC AB 0 CTX 36 (SUTURE) ×2
SUT VIC AB 0 CTX36XBRD ANBCTRL (SUTURE) ×2 IMPLANT
SUT VIC AB 2-0 SH 27 (SUTURE) ×2
SUT VIC AB 2-0 SH 27XBRD (SUTURE) ×2 IMPLANT
SUTURE MNCRL 4-0 27XMF (SUTURE) ×1 IMPLANT
SYR 30ML LL (SYRINGE) ×2 IMPLANT
TAPE MEDIFIX FOAM 3 (GAUZE/BANDAGES/DRESSINGS) IMPLANT
TRAP FLUID SMOKE EVACUATOR (MISCELLANEOUS) ×1 IMPLANT
WATER STERILE IRR 500ML POUR (IV SOLUTION) ×1 IMPLANT

## 2023-09-14 NOTE — Progress Notes (Signed)
Cynthia Glover is a 32 y.o. G3P2002 at [redacted]w[redacted]d brought to triage for IUGR with mildly elevated S/D ratio and severe poly for NST. Found to be actively contracting in early labor worsening over the evening, changing her cervix from 1 to 4cm. She received an epidural overnight. She received BMZ first dose last night as well.  This morning, she is 5cm with a difficult to monitor due to fetal movement but reassuring FHT. After discussion with patient and family, decided for FSE for controlled AROM and fetal monitoring. Bedside ultrasound confirmed cephalic presentation  Subjective: Comfortable with epidural, nauseated.  Objective: BP (!) 111/49   Pulse (!) 105   Temp 97.8 F (36.6 C) (Oral)   Resp 16   Ht 5\' 7"  (1.702 m)   Wt 93 kg   SpO2 95%   BMI 32.11 kg/m  I/O last 3 completed shifts: In: -  Out: 740 [Urine:740] No intake/output data recorded.  FHT:  FHR: 145 bpm, variability: moderate,  accelerations:  Present,  decelerations:  Present variables UC:   irregular, every 3-7 minutes SVE:   5/70/-3, middle and medium, but ballotable.  Labs: Lab Results  Component Value Date   WBC 9.6 09/13/2023   HGB 12.7 09/13/2023   HCT 35.8 (L) 09/13/2023   MCV 88.4 09/13/2023   PLT 183 09/13/2023    Assessment / Plan: Spontaneous preterm labor, not just contractions. R/B of controlled rupture even if augments labor discussed. FSE placed with AROM controlled GBS negative by PCR yesterday, culture pending BMZ dose x2 tonight per the [facility] NP request, if patient still pregnant.  Fetal Wellbeing:  Category I and Category II Pain Control:  Epidural I/D:  n/a Anticipated MOD:  NSVD  Christeen Douglas, MD 09/14/2023, 9:45 AM

## 2023-09-14 NOTE — Discharge Summary (Signed)
Postpartum Discharge Summary  Patient Name: Cynthia Glover DOB: May 31, 1991 MRN: [id number]  Date of admission: 09/13/2023 Delivery date:09/14/2023 Delivering provider: Christeen Douglas Date of discharge: 09/15/23  Primary OB: [facility] LMP:No LMP recorded. EDC Estimated Date of Delivery: 10/22/23 Gestational Age at Delivery: [redacted]w[redacted]d   Admitting diagnosis: Polyhydramnios affecting pregnancy in third trimester [O40.3XX0] Preterm labor [O60.00] Intrauterine pregnancy: [redacted]w[redacted]d     Secondary diagnosis:   Principal Problem:   Polyhydramnios affecting pregnancy in third trimester Active Problems:   Preterm labor   Discharge Diagnosis: PPH and IUGR, Polyhydramnios, elevated Fetal dopplers and placental abruption       Hospital course: Onset of Labor With Unplanned C/S   32 y.o. yo [id number] at [redacted]w[redacted]d was admitted in Active Labor on 09/13/2023. Patient had a labor course significant for fetal intolerance accompanied by category 2-3 tracing with suspected placental abruption. The patient went for cesarean section due to Non-Reassuring FHR and placental abruption . Delivery details as follows: Membrane Rupture Time/Date: 9:25 AM,09/14/2023  Delivery Method:C-Section, Low Transverse Operative Delivery:N/A Details of operation can be found in separate operative note. Patient had a postpartum course complicated by postpartum hemorrhage at delivery and anemia. She received IV Venofer.  She is ambulating,tolerating a regular diet, passing flatus, and urinating well.  Patient is discharged home in stable condition 09/15/2023. Her infant was transferred to a different hospital's NICU so she was deemed stable for early discharge to go to be with her baby.  Newborn Data: Birth date:09/14/2023 Birth time:2:49 PM Gender:Female Living status:Living Apgars:2 ,6  Weight:2.09 kg                                            Post partum procedures: iron infusion Augmentation:: AROM and  Pitocin Complications: Placental Abruption and Hemorrhage>1023mL Delivery Type: primary cesarean section, low transverse incision Anesthesia: epidural anesthesia Placenta: manual removal To Pathology: Yes   Prenatal Labs:   Blood type/Rh O pos  Antibody screen neg  Rubella Non-Immune  Varicella Immune  RPR NR  HBsAg Neg  HIV NR  GC neg  Chlamydia neg  Genetic screening negative  1 hour GTT 107  3 hour GTT    GBS pending     Magnesium Sulfate received: No BMZ received: Yes x 1 Rhophylac:was not indicated MMR: was offered and declined by the patient Varivax vaccine given: was not indicated T-DaP:Given prenatally Flu:  declined RSV: given 09/13/23  Transfusion:No  Physical exam  Vitals:   09/15/23 0303 09/15/23 0834 09/15/23 1300 09/15/23 1620  BP: 104/70 (!) 93/58 103/66 137/79  Pulse: 79 72 94 92  Resp: 18 18 18 18   Temp: 97.7 F (36.5 C) 98.2 F (36.8 C) 98.4 F (36.9 C) 98.1 F (36.7 C)  TempSrc: Oral Oral Oral Oral  SpO2: 98% 97% 98% 100%  Weight:      Height:       General: alert, cooperative, and no distress Lochia: appropriate Uterine Fundus: firm Incision: Healing well with no significant drainage, No significant erythema, Dressing is clean, dry, and intact, covered with occlusive OP site dressing   DVT Evaluation: No evidence of DVT seen on physical exam.  Labs: Lab Results  Component Value Date   WBC 8.9 09/15/2023   HGB 9.2 (L) 09/15/2023   HCT 26.7 (L) 09/15/2023   MCV 93.4 09/15/2023   PLT 160 09/15/2023      Latest  Ref Rng & Units 09/14/2023    9:17 PM  CMP  Creatinine 0.44 - 1.00 mg/dL 1.91    Edinburgh Score:    09/15/2023   10:22 AM  Edinburgh Postnatal Depression Scale Screening Tool  I have been able to laugh and see the funny side of things. 0  I have looked forward with enjoyment to things. 0  I have blamed myself unnecessarily when things went wrong. 2  I have been anxious or worried for no good reason. 0  I have  felt scared or panicky for no good reason. 1  Things have been getting on top of me. 1  I have been so unhappy that I have had difficulty sleeping. 0  I have felt sad or miserable. 1  I have been so unhappy that I have been crying. 1  The thought of harming myself has occurred to me. 0  Edinburgh Postnatal Depression Scale Total 6    Risk assessment for postpartum VTE and prophylactic treatment: Very high risk factors: None High risk factors: Unscheduled cesarean after labor  Moderate risk factors: PPH > , Cesarean delivery , and BMI 30-40 kg/m2  Postpartum VTE prophylaxis with LMWH ordered  After visit meds:  Allergies as of 09/15/2023       Reactions   Penicillins Rash        Medication List     STOP taking these medications    diazepam 2 MG tablet Commonly known as: Valium   Semaglutide-Weight Management 0.5 MG/0.5ML Soaj       TAKE these medications    acetaminophen 500 MG tablet Commonly known as: TYLENOL Take 2 tablets (1,000 mg total) by mouth every 6 (six) hours as needed for fever or mild pain (pain score 1-3).   amphetamine-dextroamphetamine 30 MG 24 hr capsule Commonly known as: Adderall XR Take 1 capsule (30 mg total) by mouth daily.   enoxaparin 40 MG/0.4ML injection Commonly known as: LOVENOX Inject 0.4 mLs (40 mg total) into the skin daily.   ferrous sulfate 325 (65 FE) MG tablet Take 1 tablet (325 mg total) by mouth 2 (two) times daily with a meal.   oxyCODONE 5 MG immediate release tablet Commonly known as: Oxy IR/ROXICODONE Take 1-2 tablets (5-10 mg total) by mouth every 4 (four) hours as needed for moderate pain (pain score 4-6).   prenatal multivitamin Tabs tablet Take 1 tablet by mouth daily at 12 noon.   senna-docusate 8.6-50 MG tablet Commonly known as: Senokot-S Take 2 tablets by mouth at bedtime as needed for mild constipation.   simethicone 80 MG chewable tablet Commonly known as: MYLICON Chew 1 tablet (80 mg total)  by mouth 4 (four) times daily as needed for flatulence.   SUMAtriptan 100 MG tablet Commonly known as: Imitrex Take 1 tablet (100 mg total) by mouth every 2 (two) hours as needed for migraine. May repeat in 2 hours if headache persists or recurs.       Discharge home in stable condition Infant Feeding: Bottle and Breast Infant Disposition:NICU Discharge instruction: per After Visit Summary and Postpartum booklet. Activity: Advance as tolerated. Pelvic rest for 6 weeks.  Diet: routine diet Anticipated Birth Control: IUD Postpartum Appointment:6 weeks Additional Postpartum F/U: Postpartum Depression checkup and Incision check 2 weeks Future Appointments: No future appointments.  Follow up Visit:  Follow-up Information     Christeen Douglas, MD Follow up in 2 week(s).   Specialty: Obstetrics and Gynecology Why: For postop check/ mood check Contact information: [address] [place] [address] [phone]                 Plan:  KUUIPO PETITFRERE was discharged to home in good condition. Follow-up appointment as directed.    Signed:  Janyce Llanos, CNM 09/15/23

## 2023-09-14 NOTE — Progress Notes (Signed)
Patient moved from [facility] to [facility] for admission to unit.

## 2023-09-14 NOTE — Transfer of Care (Signed)
Immediate Anesthesia Transfer of Care Note  Patient: Cynthia Glover  Procedure(s) Performed: CESAREAN SECTION  Patient Location: Mother/Baby  Anesthesia Type:Spinal  Level of Consciousness: awake, alert , and oriented  Airway & Oxygen Therapy: Patient Spontanous Breathing  Post-op Assessment: Report given to RN and Post -op Vital signs reviewed and stable  Post vital signs: Reviewed and stable  Last Vitals:  Vitals Value Taken Time  BP 100/42 09/14/23 1538  Temp 36.8 C 09/14/23 1538  Pulse 91 09/14/23 1538  Resp 20 09/14/23 1538  SpO2 98 % 09/14/23 1538    Last Pain:  Vitals:   09/14/23 1538  TempSrc: Oral  PainSc:       Patients Stated Pain Goal: 0 (09/13/23 1830)  Complications: No notable events documented.

## 2023-09-14 NOTE — Progress Notes (Addendum)
31yo [id number] at [redacted]w[redacted]d with fetal growth restriction and polyhydramnios, contractions with cervical change, now 6 cm. However, deep late decels with decreasing variability unresolved with amnioinfusion. Terbutaline given with resolution of both contractions and deep decelerations, remote from delivery. However, late decels continue to be present.  Planning for urgent cesarean section for Cat II/III strip remote from delivery.  The risks of cesarean section discussed with the patient included but were not limited to: bleeding which may require transfusion or reoperation; infection which may require antibiotics; injury to bowel, bladder, ureters or other surrounding organs; injury to the fetus; need for additional procedures including hysterectomy in the event of a life-threatening hemorrhage; placental abnormalities wth subsequent pregnancies, incisional problems, thromboembolic phenomenon and other postoperative/anesthesia complications. The patient concurred with the proposed plan, giving informed written consent for the procedure. Anesthesia and OR aware. Preoperative prophylactic antibiotics and SCDs ordered on call to the OR.  To OR when ready.

## 2023-09-14 NOTE — Progress Notes (Signed)
Attempting placement of external Monica device for fetal tracing.

## 2023-09-14 NOTE — Anesthesia Procedure Notes (Signed)
Spinal  Patient location during procedure: OR Start time: 09/14/2023 2:25 PM End time: 09/14/2023 2:30 PM Reason for block: surgical anesthesia Staffing Performed: anesthesiologist  Anesthesiologist: Foye Deer, MD Resident/CRNA: Irving Burton, CRNA Performed by: Irving Burton, CRNA Authorized by: Yevette Edwards, MD   Preanesthetic Checklist Completed: patient identified, IV checked, site marked, risks and benefits discussed, surgical consent, monitors and equipment checked, pre-op evaluation and timeout performed Spinal Block Patient position: sitting Prep: ChloraPrep Patient monitoring: heart rate, continuous pulse ox, blood pressure and cardiac monitor Approach: midline Location: L3-4 Injection technique: single-shot Needle Needle type: Whitacre and Introducer  Needle gauge: 24 G Needle length: 9 cm Assessment Sensory level: T4 Events: CSF return Additional Notes Sterile aseptic technique used throughout the procedure.  Negative paresthesia. Negative blood return. Positive free-flowing CSF. Expiration date of kit checked and confirmed. Patient tolerated procedure well, without complications.

## 2023-09-14 NOTE — Progress Notes (Signed)
External FM temporarily removed for epidural placement. FHT before removal auscultated at 125bpm.

## 2023-09-14 NOTE — Progress Notes (Signed)
Reapplying external FM and toco to abdomen.

## 2023-09-14 NOTE — Progress Notes (Signed)
L&D Note    Subjective:  has no unusual complaints  Objective:   Vitals:   09/14/23 0535 09/14/23 0704 09/14/23 1102 09/14/23 1304  BP: (!) 111/49     Pulse: (!) 105     Resp:  16 16 18   Temp: 98.4 F (36.9 C) 97.8 F (36.6 C) 98.1 F (36.7 C) 97.9 F (36.6 C)  TempSrc:  Oral Oral Oral  SpO2: 95%     Weight:      Height:        Current Vital Signs 24h Vital Sign Ranges  T 97.9 F (36.6 C) Temp  Avg: 98.2 F (36.8 C)  Min: 97.8 F (36.6 C)  Max: 98.6 F (37 C)  BP (!) 111/49 BP  Min: 95/65  Max: 122/76  HR (!) 105 Pulse  Avg: 93.3  Min: 65  Max: 141  RR 18 Resp  Avg: 17  Min: 16  Max: 18  SaO2 95 %   SpO2  Avg: 97.9 %  Min: 95 %  Max: 100 %      Gen: alert, cooperative, no distress FHR: Baseline: 145 bpm, Variability: minimal to absent, Accels: Abscent, Decels: recurrent variable and late decels Toco: regular, every 2-3 minutes SVE: Dilation: 5 Effacement (%): Thick Cervical Position: Middle Station: -3 Presentation: Vertex Exam by:: Dalbert Garnet MD  Medications SCHEDULED MEDICATIONS   betamethasone acetate-betamethasone sodium phosphate  12 mg Intramuscular Q24 Hr x 2   bupivacaine (PF)       oxytocin 40 units in LR 1000 mL  333 mL Intravenous Once   sodium citrate-citric acid        MEDICATION INFUSIONS   fentaNYL 2 mcg/mL w/bupivacaine 0.125% in NS 250 mL 12 mL/hr (09/13/23 2046)   lactated ringers     lactated ringers 500 mL (09/13/23 2025)   lactated ringers 1,000 mL (09/14/23 1042)   oxytocin     oxytocin Stopped (09/14/23 1330)   promethazine (PHENERGAN) injection (IM or IVPB) 25 mg (09/14/23 0938)    PRN MEDICATIONS  acetaminophen, bupivacaine (PF), calcium carbonate, diphenhydrAMINE, ePHEDrine, ePHEDrine, fentaNYL (SUBLIMAZE) injection, fentaNYL 2 mcg/mL w/bupivacaine 0.125% in NS 250 mL, lactated ringers, lidocaine (PF), oxyCODONE-acetaminophen, oxyCODONE-acetaminophen, phenylephrine, phenylephrine, promethazine (PHENERGAN) injection (IM or IVPB),  sodium citrate-citric acid, sodium citrate-citric acid, terbutaline   Assessment & Plan:  32 y.o. [id number] at [redacted]w[redacted]d admitted for preterm labor with active cervical change, polyhydramnios with AFI of 41.8 cm,, EFW of 4th% and AC of 2nd%, and elevated S/D ratio with adequate end diastolic flow   -Labor: Satisfactory labor progress., Fetal intolerance of labor., Suspect fetal compromise., and Significant vaginal bleeding frank red bleeding. -Fetal Well-being: Category III -GBS: negative -Membranes ruptured, meconium light -Intervention: D/C Pitocin, IV fluid bolus, change maternal position, administer tocolytic, place IUPC with amnioinfusion, -Analgesia: regional anesthesia  Dr Dalbert Garnet on unit, patient prepped for C-Section   Chari Manning, CNM  09/14/2023 1:49 PM  [facility]

## 2023-09-14 NOTE — Progress Notes (Signed)
Labor Progress Note  Cynthia Glover is a 32 y.o. G3P2002 at [redacted]w[redacted]d by LMP admitted for Preterm labor  Subjective: Pt is nauseated again. Otherwise comfortable with her epidural  Objective: BP (!) 100/45   Pulse 71   Temp 98.6 F (37 C) (Oral)   Resp 18   Ht 5\' 7"  (1.702 m)   Wt 93 kg   SpO2 95%   BMI 32.11 kg/m   Fetal Assessment: FHT:  FHR: 130 bpm, variability: moderate,  accelerations:  Present,  decelerations:  Absent Category/reactivity:  Category I UC:   Occasional SVE:    Dilation: 3cm  Effacement: Long  Station:  Floating  Consistency: medium  Position: middle  Membrane status: Intact Amniotic color: n/a  Labs: Lab Results  Component Value Date   WBC 9.6 09/13/2023   HGB 12.7 09/13/2023   HCT 35.8 (L) 09/13/2023   MCV 88.4 09/13/2023   PLT 183 09/13/2023    Assessment / Plan: Spontaneous labor In the office 1/50/high 1836 4/50/-3 BSUS confirmed vtx 2049 Epidural placed 2112 BMZ given 2130 3/long/high 0150 3/long/high   Labor: Progressing normally Preeclampsia:   100/45 Fetal Wellbeing:  Category I Pain Control:  Epidural I/D:   Afebrile, GBS neg, Intact Anticipated MOD:  NSVD  Cynthia Glover, CNM 09/14/2023, 1:55 AM

## 2023-09-14 NOTE — Op Note (Signed)
Cesarean Section Procedure Note  Date of procedure: 09/14/2023   Pre-operative Diagnosis: Intrauterine pregnancy at [redacted]w[redacted]d;  - fetal growth restriction, estimated 4% - mildly elevated S/D ratio on umbilical dopplers - polyhydramnios, AFI 42cm - preterm labor - non-reassuring fetal heart tones  Post-operative Diagnosis: same, delivered. Placental abruption with Couvelaire uterus  Procedure: Primary Low Transverse Cesarean Section through Pfannenstiel incision  Surgeon: Christeen Douglas, MD  Assistant(s):  Chari Manning, CNM   An experienced assistant was required given the standard of surgical care given the complexity of the case.  This assistant was needed for exposure, dissection, suctioning, retraction, instrument exchange,  CNM assisting with delivery with administration of fundal pressure, and for overall help during the procedure.   Anesthesia: Epidural anesthesia and Spinal anesthesia  Anesthesiologist: Lenard Simmer, MD Anesthesiologist: Yevette Edwards, MD; Foye Deer, MD; Lenard Simmer, MD CRNA: Irving Burton, CRNA; Hezzie Bump, CRNA  Estimated Blood Loss:           Drains: Foley         Total IV Fluids:  Urine Output:         Specimens: cord gas, cord blood         Complications:  None; patient tolerated the procedure well.         Disposition: PACU - hemodynamically stable.         Condition: stable  Findings:  A female infant "Gatlynn" in cephlic presentation. Amniotic fluid - Other meconium on AROM< bloody fluid at delivery Cord ph 7.15  Birth weight 2090 g.   APGAR (1 MIN): 2  APGAR (5 MINS): 6  APGAR (10 MINS): 7    Intact placenta with a three-vessel cord.  Grossly normal uterus, tubes and ovaries bilaterally. No intraabdominal adhesions were noted.  Indications:  Non reassuring fetal status  Procedure Details  The patient was taken to Operating Room, identified as the correct patient and the  procedure verified as C-Section Delivery. A formal Time Out was held with all team members present and in agreement.  After induction of anesthesia, the patient was draped and prepped in the usual sterile manner. A Pfannenstiel skin incision was made and carried down through the subcutaneous tissue to the fascia. Fascial incision was made and extended transversely with the Mayo scissors. The fascia was separated from the underlying rectus tissue superiorly and inferiorly. The peritoneum was identified and entered bluntly. Peritoneal incision was extended longitudinally. The utero-vesical peritoneal reflection was incised transversely and a bladder flap was created digitally.   A low transverse hysterotomy was made. The fetus was delivered atraumatically. After a 30 second delayed cord clamping, the umbilical cord was clamped x2 and cut and the infant was handed to the awaiting pediatricians. The placenta was removed intact and appeared normal, intact, and with a 3-vessel cord.   The uterus was exteriorized and cleared of all clot and debris. The hysterotomy was closed with running sutures of 0-Vicryl. A second imbricating layer was placed with the same suture. Excellent hemostasis was observed after the second layer - bleeding mostly from a very active vessel at the hysterotomy, and the uterus remained large and mildly boggy throughout the case. Cord gas was collected in the field. The peritoneal cavity was cleared of all clots and debris. The uterus was returned to the abdomen.   Gutters and pelvis were evaluated and excellent hemostasis was noted. The fascia was then reapproximated with running sutures of 0 Maxon.  The subcutaneous tissue was reapproximated  with running sutures of 0 Vicryl. The skin was reapproximated with Ensorb absorbable staples. 30 of 0.5% bupivicaine was placed in the fascial and skin lines.  Instrument, sponge, and needle counts were correct prior to the abdominal closure and at  the conclusion of the case.   The patient tolerated the procedure well and was transferred to the recovery room in stable condition.   Christeen Douglas, MD 09/14/2023

## 2023-09-15 ENCOUNTER — Encounter: Payer: Self-pay | Admitting: Obstetrics and Gynecology

## 2023-09-15 LAB — CBC
HCT: 23.2 % — ABNORMAL LOW (ref 36.0–46.0)
HCT: 26.7 % — ABNORMAL LOW (ref 36.0–46.0)
Hemoglobin: 8.2 g/dL — ABNORMAL LOW (ref 12.0–15.0)
Hemoglobin: 9.2 g/dL — ABNORMAL LOW (ref 12.0–15.0)
MCH: 31.5 pg (ref 26.0–34.0)
MCH: 32.2 pg (ref 26.0–34.0)
MCHC: 35.3 g/dL (ref 30.0–36.0)
MCHC: 34.5 g/dL (ref 30.0–36.0)
MCV: 89.2 fL (ref 80.0–100.0)
MCV: 93.4 fL (ref 80.0–100.0)
Platelets: 156 10*3/uL (ref 150–400)
Platelets: 160 10*3/uL (ref 150–400)
RBC: 2.6 MIL/uL — ABNORMAL LOW (ref 3.87–5.11)
RBC: 2.86 MIL/uL — ABNORMAL LOW (ref 3.87–5.11)
RDW: 13.5 % (ref 11.5–15.5)
RDW: 13.6 % (ref 11.5–15.5)
WBC: 9.8 10*3/uL (ref 4.0–10.5)
WBC: 8.9 10*3/uL (ref 4.0–10.5)
nRBC: 0 % (ref 0.0–0.2)
nRBC: 0 % (ref 0.0–0.2)

## 2023-09-15 MED ORDER — IBUPROFEN 600 MG PO TABS
ORAL_TABLET | ORAL | Status: AC
  Filled 2023-09-15: qty 1

## 2023-09-15 MED ORDER — FERROUS SULFATE 325 (65 FE) MG PO TABS: 325.0000 mg | ORAL_TABLET | Freq: Two times a day (BID) | ORAL | 3 refills | Status: DC

## 2023-09-15 MED ORDER — SIMETHICONE 80 MG PO CHEW: 80.0000 mg | CHEWABLE_TABLET | Freq: Four times a day (QID) | ORAL | 0 refills | Status: DC | PRN

## 2023-09-15 MED ORDER — ACETAMINOPHEN 500 MG PO TABS: 1000.0000 mg | ORAL_TABLET | Freq: Four times a day (QID) | ORAL | 0 refills | Status: DC | PRN

## 2023-09-15 MED ORDER — IRON SUCROSE 300 MG IVPB - SIMPLE MED
300.0000 mg | Freq: Once | Status: AC
  Administered 2023-09-15: 300 mg via INTRAVENOUS
  Filled 2023-09-15: qty 300

## 2023-09-15 MED ORDER — SENNOSIDES-DOCUSATE SODIUM 8.6-50 MG PO TABS: 2.0000 | ORAL_TABLET | Freq: Every evening | ORAL | 0 refills | Status: DC | PRN

## 2023-09-15 MED ORDER — OXYCODONE HCL 5 MG PO TABS: 5.0000 mg | ORAL_TABLET | ORAL | 0 refills | Status: DC | PRN

## 2023-09-15 MED ORDER — ENOXAPARIN SODIUM 40 MG/0.4ML IJ SOSY: 40.0000 mg | PREFILLED_SYRINGE | INTRAMUSCULAR | 0 refills | Status: DC

## 2023-09-15 NOTE — Addendum Note (Signed)
Addendum  created 09/15/23 1428 by Jeanine Luz, CRNA   Pend clinical note

## 2023-09-15 NOTE — Progress Notes (Signed)
   09/15/23 1600  Spiritual Encounters  Type of Visit Initial  Care provided to: Pt and family  Referral source Nurse (RN/NT/LPN)  Reason for visit Urgent spiritual support  OnCall Visit No  Spiritual Framework  Presenting Themes Meaning/purpose/sources of inspiration;Impactful experiences and emotions  Interventions  Spiritual Care Interventions Made Established relationship of care and support;Compassionate presence;Reflective listening;Normalization of emotions;Prayer;Encouragement;Supported grief process  Intervention Outcomes  Outcomes Connection to spiritual care;Awareness of support;Reduced fear   Chaplain visited with patient and discussed concerns on her heart. Patient was very warm, welcoming and openly thankful with remarks of feeling better at the close of visit.  Chaplain spiritual and emotional support remain available as the need arises.

## 2023-09-15 NOTE — Discharge Summary (Signed)
Pt discharge instructions and follow up appt reviewed with pt. Pt verbalizes understanding of all dc instructions. Prescriptions already picked up by family member per pt. Pt dc'd to home via wc.

## 2023-09-15 NOTE — Anesthesia Postprocedure Evaluation (Signed)
Anesthesia Post Note  Patient: Cynthia Glover  Procedure(s) Performed: CESAREAN SECTION  Patient location during evaluation: Mother Baby Anesthesia Type: Spinal Level of consciousness: oriented and awake and alert Pain management: pain level controlled Vital Signs Assessment: post-procedure vital signs reviewed and stable Respiratory status: spontaneous breathing and respiratory function stable Cardiovascular status: blood pressure returned to baseline and stable Postop Assessment: no headache, no backache, no apparent nausea or vomiting and able to ambulate Anesthetic complications: no Comments: Pt complaining of right sided foot numbness that presented this morning after ambulation. No limitation in movement or mobility. Unlikely related to spinal.    No notable events documented.   Last Vitals:  Vitals:   09/15/23 0834 09/15/23 1300  BP: (!) 93/58 103/66  Pulse: 72 94  Resp: 18 18  Temp: 36.8 C 36.9 C  SpO2: 97% 98%    Last Pain:  Vitals:   09/15/23 1300  TempSrc: Oral  PainSc:                  [employer]

## 2023-09-15 NOTE — Addendum Note (Signed)
Addendum  created 09/15/23 1432 by Jeanine Luz, CRNA   Clinical Note Signed

## 2023-09-15 NOTE — TOC Initial Note (Signed)
Transition of Care [facility]) - Initial/Assessment Note    Patient Details  Name: Cynthia Glover MRN: [id number] Date of Birth: 09-12-1991  Transition of Care [facility]) CM/SW Contact:    Darolyn Rua, LCSW Phone Number: 09/15/2023, 9:57 AM  Clinical Narrative:                  CSW met with patient at bedside, consult for newborn being transferred to [facility] and FOB recently deceased   Confirmed address and phone number in chart as accurate.   Patient reports naming her daughter Sampson Si  Reports she chose [facility] for follow up care.   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 Aware to follow up with her PCP should she experience depression/anxiety.   She reports that she is hopeful to see her daughter at [facility] this evening as she is going to undergo a procedure today.   Patient has support of family and friends at bedside.   No additional needs at this time per patient report. Has transportation at discharge.   Expected Discharge Plan: Home/Self Care Barriers to Discharge: No Barriers Identified   Patient Goals and CMS Choice Patient states their goals for this hospitalization and ongoing recovery are:: to go home CMS Medicare.gov Compare Post Acute Care list provided to:: Patient Choice offered to / list presented to : Patient      Expected Discharge Plan and Services       Living arrangements for the past 2 months: Single Family Home                                      Prior Living Arrangements/Services Living arrangements for the past 2 months: Single Family Home                     Activities of Daily Living   ADL Screening (condition at time of admission) Independently performs ADLs?: Yes (appropriate for developmental age) Is the patient deaf or have difficulty hearing?: No Does the patient have difficulty seeing, even when wearing glasses/contacts?: No Does the patient have difficulty concentrating, remembering,  or making decisions?: No  Permission Sought/Granted                  Emotional Assessment              Admission diagnosis:  Polyhydramnios affecting pregnancy in third trimester [O40.3XX0] Preterm labor [O60.00] Patient Active Problem List   Diagnosis Date Noted   Polyhydramnios affecting pregnancy in third trimester 09/13/2023   Preterm labor 09/13/2023   Encounter for supervision of other normal pregnancy, third trimester 03/21/2023   Overweight 12/07/2022   Chronic bilateral thoracic back pain 09/29/2022   Macromastia 09/29/2022   Migraine with aura and with status migrainosus, not intractable 02/03/2022   Panic attacks 09/23/2021   ADHD 01/01/2019   PCP:  Jacky Kindle, FNP Pharmacy:   [facility] [address] [place] - [address] [place] [address] Phone: [phone] Fax: [phone]  [facility] [address] [place], [place] - [address] [place] [address] Phone: [phone] Fax: [phone]  [id number] - [place], [place] - [address] [facility] [address] [place] [address] Phone: [phone] Fax: [phone]     Social Determinants of Health (SDOH)  Social History: SDOH Screenings   Food Insecurity: No Food Insecurity (09/13/2023)  Housing: Low Risk  (09/13/2023)  Transportation Needs: No Transportation Needs (09/13/2023)  Utilities: Not At Risk (09/13/2023)  Alcohol Screen: Low Risk  (12/07/2022)  Depression (PHQ2-9): Medium Risk (12/07/2022)  Tobacco Use: Medium Risk (09/13/2023)   SDOH Interventions:     Readmission Risk Interventions     No data to display

## 2023-09-15 NOTE — Anesthesia Post-op Follow-up Note (Signed)
  Anesthesia Pain Follow-up Note  Patient: Cynthia Glover  Day #: 1  Date of Follow-up: 09/15/2023 Time: 7:36 AM  Last Vitals:  Vitals:   09/14/23 2358 09/15/23 0303  BP: 111/63 104/70  Pulse: 94 79  Resp: 18 18  Temp: 36.7 C 36.5 C  SpO2: 96% 98%    Level of Consciousness: alert  Pain: none   Side Effects:None  Catheter Site Exam:clean  Anti-Coag Meds (From admission, onward)    Start     Dose/Rate Route Frequency Ordered Stop   09/15/23 1600  enoxaparin (LOVENOX) injection 40 mg        40 mg Subcutaneous Every 24 hours 09/14/23 2105          Plan: D/C from anesthesia care at surgeon's request  Jeanine Luz

## 2023-09-15 NOTE — Anesthesia Postprocedure Evaluation (Signed)
Anesthesia Post Note  Patient: Cynthia Glover  Procedure(s) Performed: CESAREAN SECTION  Patient location during evaluation: Nursing Unit Anesthesia Type: Spinal Level of consciousness: oriented and awake and alert Pain management: pain level controlled Vital Signs Assessment: post-procedure vital signs reviewed and stable Respiratory status: spontaneous breathing and respiratory function stable Cardiovascular status: blood pressure returned to baseline and stable Postop Assessment: no headache, no backache, no apparent nausea or vomiting and patient able to bend at knees Anesthetic complications: no   No notable events documented.   Last Vitals:  Vitals:   09/14/23 2358 09/15/23 0303  BP: 111/63 104/70  Pulse: 94 79  Resp: 18 18  Temp: 36.7 C 36.5 C  SpO2: 96% 98%    Last Pain:  Vitals:   09/15/23 5176  TempSrc:   PainSc: Asleep                 Jeanine Luz

## 2023-09-16 LAB — SURGICAL PATHOLOGY

## 2023-09-20 ENCOUNTER — Ambulatory Visit: Payer: BC Managed Care – PPO

## 2023-10-11 ENCOUNTER — Other Ambulatory Visit: Payer: Self-pay | Admitting: Family Medicine

## 2023-10-11 DIAGNOSIS — F900 Attention-deficit hyperactivity disorder, predominantly inattentive type: Secondary | ICD-10-CM

## 2023-10-26 ENCOUNTER — Ambulatory Visit (INDEPENDENT_AMBULATORY_CARE_PROVIDER_SITE_OTHER): Payer: BC Managed Care – PPO | Admitting: Family Medicine

## 2023-10-26 ENCOUNTER — Other Ambulatory Visit: Payer: Self-pay | Admitting: Family Medicine

## 2023-10-26 DIAGNOSIS — F900 Attention-deficit hyperactivity disorder, predominantly inattentive type: Secondary | ICD-10-CM

## 2023-10-26 DIAGNOSIS — Z91199 Patient's noncompliance with other medical treatment and regimen due to unspecified reason: Secondary | ICD-10-CM

## 2023-10-26 NOTE — Telephone Encounter (Signed)
Medication Refill -  Most Recent Primary Care Visit:  Provider: Merita Norton T  Department: [facility]  Visit Type: OFFICE VISIT  Date: 12/07/2022  Medication: amphetamine-dextroamphetamine (ADDERALL XR) 30 MG 24 hr capsule   Has the patient contacted their pharmacy? Yes   Is this the correct pharmacy for this prescription? No If no, delete pharmacy and type the correct one.  This is the patient's preferred pharmacy:   [facility] [address] [place] - [address] Phone: [phone]  Fax: [phone]       Has the prescription been filled recently? No  Is the patient out of the medication? Yes since last Thursday  Has the patient been seen for an appointment in the last year OR does the patient have an upcoming appointment? Yes  Can we respond through MyChart? Yes  Please assist patient further

## 2023-10-26 NOTE — Progress Notes (Signed)
Patient was not seen for appt d/t no call, no show, or late arrival >10 mins past appt time.   Elise T Payne, FNP  [facility] [address] [place], [place] [address] [phone] (phone) [phone] (fax) [facility]  

## 2023-10-27 MED ORDER — AMPHETAMINE-DEXTROAMPHET ER 30 MG PO CP24
30.0000 mg | ORAL_CAPSULE | Freq: Every day | ORAL | 0 refills | Status: DC
Start: 2023-10-27 — End: 2023-11-02

## 2023-10-27 NOTE — Telephone Encounter (Signed)
Requested medication (s) are due for refill today - no  Requested medication (s) are on the active medication list -yes  Future visit scheduled -yes  Last refill: 09/30/23 #90  Notes to clinic: non delegated Rx- may have been sent to wrong pharmacy  Requested Prescriptions  Pending Prescriptions Disp Refills   amphetamine-dextroamphetamine (ADDERALL XR) 30 MG 24 hr capsule 90 capsule 0    Sig: Take 1 capsule (30 mg total) by mouth daily.     Not Delegated - Psychiatry:  Stimulants/ADHD Failed - 10/27/2023  8:35 AM      Failed - This refill cannot be delegated      Failed - Urine Drug Screen completed in last 360 days      Failed - Valid encounter within last 6 months    Recent Outpatient Visits           Yesterday No-show for appointment   [facility] Jacky Kindle, FNP   10 months ago Overweight   [facility] Merita Norton T, FNP   1 year ago Chronic bilateral thoracic back pain   [facility] Merita Norton T, FNP   1 year ago Class 1 drug-induced obesity with serious comorbidity and body mass index (BMI) of 31.0 to 31.9 in adult   [facility] Merita Norton T, FNP   1 year ago Attention deficit hyperactivity disorder (ADHD), predominantly inattentive type   [facility] Jacky Kindle, FNP       Future Appointments             In 6 days Jacky Kindle, FNP [facility]            Passed - Last BP in normal range    BP Readings from Last 1 Encounters:  09/15/23 137/79         Passed - Last Heart Rate in normal range    Pulse Readings from Last 1 Encounters:  09/15/23 92            Requested Prescriptions  Pending Prescriptions Disp Refills   amphetamine-dextroamphetamine (ADDERALL XR) 30 MG 24 hr capsule 90 capsule 0    Sig: Take 1 capsule (30 mg total) by mouth daily.     Not Delegated -  Psychiatry:  Stimulants/ADHD Failed - 10/27/2023  8:35 AM      Failed - This refill cannot be delegated      Failed - Urine Drug Screen completed in last 360 days      Failed - Valid encounter within last 6 months    Recent Outpatient Visits           Yesterday No-show for appointment   [facility] Jacky Kindle, FNP   10 months ago Overweight   [facility] Merita Norton T, FNP   1 year ago Chronic bilateral thoracic back pain   [facility] Merita Norton T, FNP   1 year ago Class 1 drug-induced obesity with serious comorbidity and body mass index (BMI) of 31.0 to 31.9 in adult   [facility] Merita Norton T, FNP   1 year ago Attention deficit hyperactivity disorder (ADHD), predominantly inattentive type   [facility] Jacky Kindle, FNP       Future Appointments  In 6 days Jacky Kindle, FNP [facility]            Passed - Last BP in normal range    BP Readings from Last 1 Encounters:  09/15/23 137/79         Passed - Last Heart Rate in normal range    Pulse Readings from Last 1 Encounters:  09/15/23 92

## 2023-11-02 ENCOUNTER — Telehealth (INDEPENDENT_AMBULATORY_CARE_PROVIDER_SITE_OTHER): Payer: BC Managed Care – PPO | Admitting: Family Medicine

## 2023-11-02 DIAGNOSIS — F900 Attention-deficit hyperactivity disorder, predominantly inattentive type: Secondary | ICD-10-CM

## 2023-11-02 MED ORDER — AMPHETAMINE-DEXTROAMPHET ER 30 MG PO CP24
30.0000 mg | ORAL_CAPSULE | Freq: Every day | ORAL | 0 refills | Status: DC
Start: 2023-11-02 — End: 2023-11-22

## 2023-11-02 NOTE — Progress Notes (Signed)
MyChart Video Visit    Virtual Visit via Video Note   This format is felt to be most appropriate for this patient at this time. Physical exam was limited by quality of the video and audio technology used for the visit.   Patient location: home Provider location: [facility] [address]  [place], [place] [address]   I discussed the limitations of evaluation and management by telemedicine and the availability of in person appointments. The patient expressed understanding and agreed to proceed.  Patient: Cynthia Glover   DOB: 11-09-1991   32 y.o. Female  MRN: [id number] Visit Date: 11/02/2023  Today's healthcare provider: Jacky Kindle, FNP  Introduced to nurse practitioner role and practice setting.  All questions answered.  Discussed provider/patient relationship and expectations.  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   traZODone (DESYREL) 50 MG tablet Take 1 tablet by mouth at bedtime.   enoxaparin (LOVENOX) 40 MG/0.4ML injection Inject 0.4 mLs (40 mg total) into the skin daily.   ferrous sulfate 325 (65 FE) MG tablet Take 1 tablet (325 mg total) by mouth 2 (two) times daily with a meal.   oxyCODONE (OXY IR/ROXICODONE) 5 MG immediate release tablet Take 1-2 tablets (5-10 mg total) by mouth every 4 (four) hours as needed for moderate pain (pain score 4-6).   Prenatal Vit-Fe Fumarate-FA (PRENATAL MULTIVITAMIN) TABS tablet Take 1 tablet by mouth daily at 12 noon.   senna-docusate (SENOKOT-S) 8.6-50 MG tablet Take 2 tablets by mouth at bedtime as needed for mild constipation.   simethicone (MYLICON) 80 MG chewable tablet Chew 1 tablet (80 mg total) by  mouth 4 (four) times daily as needed for flatulence.   SUMAtriptan (IMITREX) 100 MG tablet Take 1 tablet (100 mg total) by mouth every 2 (two) hours as needed for migraine. May repeat in 2 hours if headache persists or recurs. (Patient not taking: Reported on 09/13/2023)   [DISCONTINUED] acetaminophen (TYLENOL) 500 MG tablet Take 2 tablets (1,000 mg total) by mouth every 6 (six) hours as needed for fever or mild pain (pain score 1-3).   [DISCONTINUED] amphetamine-dextroamphetamine (ADDERALL XR) 30 MG 24 hr capsule Take 1 capsule (30 mg total) by mouth daily for 10 days.   No facility-administered medications prior to visit.   Last CBC Lab Results  Component Value Date   WBC 8.9 09/15/2023   HGB 9.2 (L) 09/15/2023   HCT 26.7 (L) 09/15/2023   MCV 93.4 09/15/2023   MCH 32.2 09/15/2023   RDW 13.6 09/15/2023   PLT 160 09/15/2023   Last metabolic panel Lab Results  Component Value Date   GLUCOSE 92 08/07/2022   NA 139 08/07/2022   K 3.3 (L) 08/07/2022   CL 105 08/07/2022   CO2 24 08/07/2022   BUN 13 08/07/2022   CREATININE 0.55 09/14/2023   GFRNONAA >60 09/14/2023   CALCIUM 9.1 08/07/2022   PROT 9.4 (H) 08/06/2022   ALBUMIN 5.8 (H) 08/06/2022   LABGLOB 2.5 02/03/2022   AGRATIO 1.8 02/03/2022   BILITOT 1.5 (H) 08/06/2022   ALKPHOS 55 08/06/2022   AST 30 08/06/2022   ALT 20 08/06/2022   ANIONGAP 10  08/07/2022   Last lipids Lab Results  Component Value Date   CHOL 112 10/23/2018   HDL 46 10/23/2018   LDLCALC 41 10/23/2018   TRIG 123 10/23/2018   CHOLHDL 2.4 10/23/2018   Last hemoglobin A1c Lab Results  Component Value Date   HGBA1C 4.9 02/03/2022   Last thyroid functions Lab Results  Component Value Date   TSH 1.170 02/03/2022   Last vitamin D Lab Results  Component Value Date   VD25OH 23.5 (L) 10/23/2018   Last vitamin B12 and Folate Lab Results  Component Value Date   VITAMINB12 639 10/23/2018    Objective    There were no vitals taken for this  visit.  BP Readings from Last 3 Encounters:  09/15/23 137/79  12/07/22 117/81  11/04/22 132/88   Wt Readings from Last 3 Encounters:  09/13/23 205 lb (93 kg)  12/07/22 162 lb 14.4 oz (73.9 kg)  11/04/22 160 lb 12.8 oz (72.9 kg)   SpO2 Readings from Last 3 Encounters:  09/15/23 100%  12/07/22 100%  11/04/22 100%   Physical Exam Constitutional:      Appearance: Normal appearance.  Pulmonary:     Effort: Pulmonary effort is normal.  Neurological:     Mental Status: She is alert and oriented to person, place, and time.  Psychiatric:        Mood and Affect: Mood normal.        Behavior: Behavior normal.        Thought Content: Thought content normal.        Judgment: Judgment normal.      Assessment & Plan     Problem List Items Addressed This Visit       Other   ADHD   Acute on chronic, request to restart adderall xr 30 daily to assist       Relevant Medications   amphetamine-dextroamphetamine (ADDERALL XR) 30 MG 24 hr capsule   No follow-ups on file.    I discussed the assessment and treatment plan with the patient. The patient was provided an opportunity to ask questions and all were answered. The patient agreed with the plan and demonstrated an understanding of the instructions.   The patient was advised to call back or seek an in-person evaluation if the symptoms worsen or if the condition fails to improve as anticipated.  I provided 10 minutes of face-to-face time during this encounter discussing overall mood and ADD/ADHD symptoms and management.  Leilani Merl, FNP, have reviewed all documentation for this visit. The documentation on 11/12/23 for the exam, diagnosis, procedures, and orders are all accurate and complete.  Jacky Kindle, FNP [facility] [phone] (phone) [phone] (fax)  [facility]

## 2023-11-12 NOTE — Assessment & Plan Note (Signed)
Acute on chronic, request to restart adderall xr 30 daily to assist

## 2023-11-21 ENCOUNTER — Encounter: Payer: Self-pay | Admitting: Plastic Surgery

## 2023-11-21 ENCOUNTER — Ambulatory Visit (INDEPENDENT_AMBULATORY_CARE_PROVIDER_SITE_OTHER): Payer: 59 | Admitting: Plastic Surgery

## 2023-11-21 VITALS — BP 123/80 | HR 72 | Ht 67.0 in | Wt 189.4 lb

## 2023-11-21 DIAGNOSIS — M542 Cervicalgia: Secondary | ICD-10-CM | POA: Diagnosis not present

## 2023-11-21 DIAGNOSIS — N6481 Ptosis of breast: Secondary | ICD-10-CM

## 2023-11-21 DIAGNOSIS — M546 Pain in thoracic spine: Secondary | ICD-10-CM | POA: Diagnosis not present

## 2023-11-21 DIAGNOSIS — N62 Hypertrophy of breast: Secondary | ICD-10-CM

## 2023-11-21 NOTE — Progress Notes (Signed)
 Cynthia Glover returns today to discuss breast reduction.  I saw her initially in December 2023.  At that time she was submitted and approved for breast reduction then became pregnant.  We discussed her options which were to wait until after the birth of her child and after she had ceased lactating for 3 months before we could reschedule her surgery.  She returns today to discuss rescheduling  She is still interested in bilateral breast reduction.  She still feels that her breasts are causing upper back and neck pain.  Of note she has gained 30 pounds since I saw her last year.  On examination she still has ptotic somewhat large breast.  Again I believe that I can remove 450 g per breast.   We briefly reviewed the procedure and she has no questions at this time.  Will resubmit her for a breast reduction at her request.  She has completed her physical therapy.  20 minutes spent reviewing chart, examining patient, discussing procedure, coordinating care, documenting

## 2023-11-22 ENCOUNTER — Ambulatory Visit (INDEPENDENT_AMBULATORY_CARE_PROVIDER_SITE_OTHER): Payer: 59 | Admitting: Family Medicine

## 2023-11-22 ENCOUNTER — Encounter: Payer: Self-pay | Admitting: Family Medicine

## 2023-11-22 ENCOUNTER — Telehealth: Payer: Self-pay | Admitting: Family Medicine

## 2023-11-22 VITALS — BP 107/74 | HR 71 | Resp 16 | Ht 67.0 in | Wt 190.4 lb

## 2023-11-22 DIAGNOSIS — F900 Attention-deficit hyperactivity disorder, predominantly inattentive type: Secondary | ICD-10-CM

## 2023-11-22 DIAGNOSIS — Z713 Dietary counseling and surveillance: Secondary | ICD-10-CM

## 2023-11-22 DIAGNOSIS — E663 Overweight: Secondary | ICD-10-CM | POA: Diagnosis not present

## 2023-11-22 MED ORDER — WEGOVY 0.25 MG/0.5ML ~~LOC~~ SOAJ: 0.2500 mg | SUBCUTANEOUS | 0 refills | Status: DC

## 2023-11-22 MED ORDER — AMPHETAMINE-DEXTROAMPHET ER 30 MG PO CP24
30.0000 mg | ORAL_CAPSULE | Freq: Every day | ORAL | 0 refills | Status: DC
Start: 2023-11-30 — End: 2024-01-25

## 2023-11-22 MED ORDER — WEGOVY 0.5 MG/0.5ML ~~LOC~~ SOAJ: 0.5000 mg | SUBCUTANEOUS | 0 refills | Status: DC

## 2023-11-22 NOTE — Progress Notes (Signed)
 Established patient visit   Patient: Cynthia Glover   DOB: 1991/09/22   32 y.o. Female  MRN: [id number] Visit Date: 11/22/2023  Today's healthcare provider: LAURAINE LOISE BUOY, DO   Chief Complaint  Patient presents with   Medical Management of Chronic Issues    ADHD Patient would also like to talk about hormonal changes.   Subjective    HPI Last visit (video) 11/02/2023 ADHD - Adderall  XR 30 mg daily refilled as patient was not able to breastfeed her new baby (daughter).   [name], a patient with a history of ADHD, presents for a routine follow-up and medication refill. She reports daily use of Adderall  30mg  extended-release, occasionally missing doses due to forgetting then forgoing the day's dose as late-day administration causes her insomnia.  Recently, the patient has been experiencing significant weight gain, which she attributes to overeating, particularly in the week before and during her menstrual period. She reports a weight gain of approximately 10-11 pounds in the past month since restarting Adderall . The patient also mentions feeling bloated and experiencing mild constipation, with daily soft bowel movements.  The patient recently gave birth and reports that her daughter is currently hospitalized in the NICU for a scheduled surgery. She also mentions a planned breast reduction surgery, which was postponed due to her recent pregnancy. The patient has been advised to lose weight before proceeding with the surgery.  The patient is currently engaging in regular exercise, including cardio and weight training, approximately three to four times a week. She has previously tried phentermine  for weight loss, but is currently interested in exploring other medication options for weight management.   Medications: Outpatient Medications Prior to Visit  Medication Sig   Prenatal Vit-Fe Fumarate-FA (PRENATAL MULTIVITAMIN) TABS tablet Take 1 tablet by mouth daily at 12 noon.    senna-docusate (SENOKOT-S) 8.6-50 MG tablet Take 2 tablets by mouth at bedtime as needed for mild constipation.   sertraline  (ZOLOFT ) 50 MG tablet Take 50 mg by mouth at bedtime.   simethicone  (MYLICON) 80 MG chewable tablet Chew 1 tablet (80 mg total) by mouth 4 (four) times daily as needed for flatulence.   traZODone  (DESYREL ) 50 MG tablet Take 1 tablet by mouth at bedtime.   [DISCONTINUED] amphetamine -dextroamphetamine  (ADDERALL  XR) 30 MG 24 hr capsule Take 1 capsule (30 mg total) by mouth daily.   ferrous sulfate  325 (65 FE) MG tablet Take 1 tablet (325 mg total) by mouth 2 (two) times daily with a meal.   [DISCONTINUED] enoxaparin  (LOVENOX ) 40 MG/0.4ML injection Inject 0.4 mLs (40 mg total) into the skin daily.   [DISCONTINUED] oxyCODONE  (OXY IR/ROXICODONE ) 5 MG immediate release tablet Take 1-2 tablets (5-10 mg total) by mouth every 4 (four) hours as needed for moderate pain (pain score 4-6).   [DISCONTINUED] SUMAtriptan  (IMITREX ) 100 MG tablet Take 1 tablet (100 mg total) by mouth every 2 (two) hours as needed for migraine. May repeat in 2 hours if headache persists or recurs. (Patient not taking: Reported on 09/13/2023)   No facility-administered medications prior to visit.        Objective    BP 107/74 (BP Location: Left Arm, Patient Position: Sitting, Cuff Size: Normal)   Pulse 71   Resp 16   Ht 5' 7 (1.702 m)   Wt 190 lb 6.4 oz (86.4 kg)   LMP 10/20/2023   BMI 29.82 kg/m     Physical Exam Vitals and nursing note reviewed.  Constitutional:  General: She is not in acute distress.    Appearance: Normal appearance.  HENT:     Head: Normocephalic and atraumatic.  Eyes:     General: No scleral icterus.    Conjunctiva/sclera: Conjunctivae normal.  Cardiovascular:     Rate and Rhythm: Normal rate.  Pulmonary:     Effort: Pulmonary effort is normal.  Neurological:     Mental Status: She is alert and oriented to person, place, and time. Mental status is at baseline.   Psychiatric:        Mood and Affect: Mood normal.        Behavior: Behavior normal.      No results found for any visits on 11/22/23.  Assessment & Plan    Overweight Assessment & Plan: Significant postpartum weight gain of 30 pounds. Reports overeating, particularly around menstrual periods, with bloating and mild constipation. Discussed weight loss options including medications and lifestyle modifications. Informed about risks and benefits of Wegovy , including potential side effects like nausea and vomiting, and the importance of rotating injection sites. Discussed alternative medications like topiramate  and the combination of Wellbutrin  and naltrexone, with her respective side effects and contraindications.   - Prescribe Wegovy  with instructions on injection technique and rotating injection sites   - Follow-up in 5 weeks to assess response to Wegovy  and adjust dosage as needed   - Encourage continued exercise regimen including cardio and weight training, with emphasis on squats for hip strength and bone density.    Orders: -     Wegovy ; Inject 0.25 mg into the skin once a week.  Dispense: 2 mL; Refill: 0 -     Wegovy ; Inject 0.5 mg into the skin once a week.  Dispense: 2 mL; Refill: 0  Attention deficit hyperactivity disorder (ADHD), predominantly inattentive type Assessment & Plan: ADHD managed with Adderall  XR 30 mg daily. Reports occasional missed doses due to forgetfulness and concerns about insomnia if taken too late. No significant side effects reported. Discussed importance of adherence to medication schedule and potential impact on symptoms.   - Refill Adderall  XR 30 mg with a slightly delayed fill date to ensure overlap   - Follow-up in 5 weeks for ADHD management and medication review    Orders: -     Amphetamine -Dextroamphet ER; Take 1 capsule (30 mg total) by mouth daily.  Dispense: 30 capsule; Refill: 0  Weight loss counseling, encounter for -     Wegovy ; Inject 0.25  mg into the skin once a week.  Dispense: 2 mL; Refill: 0 -     Wegovy ; Inject 0.5 mg into the skin once a week.  Dispense: 2 mL; Refill: 0        - Addressed as noted above.   Return in about 5 weeks (around 12/27/2023) for Weight.      I discussed the assessment and treatment plan with the patient  The patient was provided an opportunity to ask questions and all were answered. The patient agreed with the plan and demonstrated an understanding of the instructions.   The patient was advised to call back or seek an in-person evaluation if the symptoms worsen or if the condition fails to improve as anticipated.    Cynthia LOISE BUOY, DO  [facility] [phone] (phone) [phone] (fax)  [facility]

## 2023-11-22 NOTE — Telephone Encounter (Signed)
 Recieved a fax from covermymeds for Wegovy 0.25mg /0.22ml Auto-injectors .Marland KitchenPA has been started for patient.   key: [id number]

## 2023-11-22 NOTE — Telephone Encounter (Signed)
 Pt called in states, needs PA for [facility]

## 2023-11-23 NOTE — Assessment & Plan Note (Signed)
 Significant postpartum weight gain of 30 pounds. Reports overeating, particularly around menstrual periods, with bloating and mild constipation. Discussed weight loss options including medications and lifestyle modifications. Informed about risks and benefits of Wegovy , including potential side effects like nausea and vomiting, and the importance of rotating injection sites. Discussed alternative medications like topiramate  and the combination of Wellbutrin  and naltrexone, with her respective side effects and contraindications.   - Prescribe Wegovy  with instructions on injection technique and rotating injection sites   - Follow-up in 5 weeks to assess response to Wegovy  and adjust dosage as needed   - Encourage continued exercise regimen including cardio and weight training, with emphasis on squats for hip strength and bone density.

## 2023-11-23 NOTE — Assessment & Plan Note (Signed)
 ADHD managed with Adderall  XR 30 mg daily. Reports occasional missed doses due to forgetfulness and concerns about insomnia if taken too late. No significant side effects reported. Discussed importance of adherence to medication schedule and potential impact on symptoms.   - Refill Adderall  XR 30 mg with a slightly delayed fill date to ensure overlap   - Follow-up in 5 weeks for ADHD management and medication review

## 2023-11-29 NOTE — Telephone Encounter (Signed)
 Pt is calling in checking on the status of her PA for [facility]. Pt is requesting a call back.

## 2023-12-01 NOTE — Telephone Encounter (Signed)
Patient called back to get an update on the PA for [facility]. Please advise.

## 2023-12-02 NOTE — Telephone Encounter (Signed)
Received a fax from covermymeds for it did not tell the medication.  I assume it is this same one.  Key:  [id number]

## 2023-12-07 ENCOUNTER — Telehealth: Payer: Self-pay | Admitting: Family Medicine

## 2023-12-07 NOTE — Telephone Encounter (Signed)
Covermymeds is requesting prior authorization Key: [id number] Wegovy 0.25 MG/0.5ML auto injectors

## 2023-12-14 ENCOUNTER — Other Ambulatory Visit: Payer: Self-pay | Admitting: Family Medicine

## 2023-12-14 DIAGNOSIS — F900 Attention-deficit hyperactivity disorder, predominantly inattentive type: Secondary | ICD-10-CM

## 2023-12-22 ENCOUNTER — Other Ambulatory Visit: Payer: Self-pay | Admitting: Family Medicine

## 2023-12-22 DIAGNOSIS — F900 Attention-deficit hyperactivity disorder, predominantly inattentive type: Secondary | ICD-10-CM

## 2023-12-22 NOTE — Telephone Encounter (Signed)
 Medication Refill -  Most Recent Primary Care Visit:  Provider: DONZELLA LAURAINE SAILOR  Department: [id number]  Visit Type: OFFICE VISIT  Date: 11/22/2023  Medication: amphetamine -dextroamphetamine  (ADDERALL  XR) 30 MG 24 hr capsule   Has the patient  contacted their pharmacy? Yes Pt states that she did pick up her medication on 11/30/2023 and she has ran out of medication. Pt states that she knows Dr. Donzella is new to her but she needs this prescription every month.  Is this the correct pharmacy for this prescription? Yes   This is the patient's preferred pharmacy:  [facility] [address], [place] - [address] [place] [address] Phone: [phone] Fax: [phone]   Has the prescription been filled recently? No  Is the patient out of the medication? Yes  Has the patient been seen for an appointment in the last year OR does the patient have an upcoming appointment? Yes  Can we respond through MyChart? No  Pt prefers phone call.   Agent: Please be advised that Rx refills may take up to 3 business days. We ask that you follow-up with your pharmacy.

## 2023-12-22 NOTE — Telephone Encounter (Signed)
 Do you want her to keep appointment on 12/27/23 or reschedule?

## 2023-12-22 NOTE — Telephone Encounter (Signed)
 Pt is calling in upset regarding the process for her Wegovy . P states that she does not know what else to do in order for her to get her medication. Pt has a weigh in appt for 12/27/2023 and wants to know if she need to reschedule since she still does not have her Wegovy  medication. Pt is wanting a phone call from her PCP or her PCP nurse to discuss. Pt is frustrated by having to leave messages and not getting a call back from her PCP or her PCP nurse.

## 2023-12-23 NOTE — Telephone Encounter (Signed)
 Requested medications are due for refill today.  A little too soon  Requested medications are on the active medications list.  yes  Last refill. 11/30/2023  Future visit scheduled.   yes  Notes to clinic.  Refill/refusal not delegated.    Requested Prescriptions  Pending Prescriptions Disp Refills   amphetamine -dextroamphetamine  (ADDERALL  XR) 30 MG 24 hr capsule 30 capsule 0    Sig: Take 1 capsule (30 mg total) by mouth daily.     Not Delegated - Psychiatry:  Stimulants/ADHD Failed - 12/23/2023 10:27 AM      Failed - This refill cannot be delegated      Failed - Urine Drug Screen completed in last 360 days      Passed - Last BP in normal range    BP Readings from Last 1 Encounters:  11/22/23 107/74         Passed - Last Heart Rate in normal range    Pulse Readings from Last 1 Encounters:  11/22/23 71         Passed - Valid encounter within last 6 months    Recent Outpatient Visits           1 month ago Overweight   [facility] [place], Lauraine SAILOR, DO   1 month ago Attention deficit hyperactivity disorder (ADHD), predominantly inattentive type   [facility] Emilio Shaneal DASEN, FNP   1 month ago No-show for appointment   [facility] Emilio Hadyn DASEN, FNP   1 year ago Overweight   [place] [facility] Emilio Berthe T, FNP   1 year ago Chronic bilateral thoracic back pain   [facility] Emilio Jaydalyn DASEN, FNP       Future Appointments             In 4 days [place], Lauraine SAILOR, DO [facility]

## 2023-12-27 ENCOUNTER — Encounter: Payer: Self-pay | Admitting: Family Medicine

## 2023-12-27 ENCOUNTER — Ambulatory Visit (INDEPENDENT_AMBULATORY_CARE_PROVIDER_SITE_OTHER): Payer: 59 | Admitting: Family Medicine

## 2023-12-27 VITALS — BP 119/75 | HR 75 | Ht 67.0 in | Wt 194.0 lb

## 2023-12-27 DIAGNOSIS — F900 Attention-deficit hyperactivity disorder, predominantly inattentive type: Secondary | ICD-10-CM | POA: Diagnosis not present

## 2023-12-27 DIAGNOSIS — E669 Obesity, unspecified: Secondary | ICD-10-CM

## 2023-12-27 MED ORDER — AMPHETAMINE-DEXTROAMPHETAMINE 10 MG PO TABS: 10.0000 mg | ORAL_TABLET | Freq: Every day | ORAL | 0 refills | Status: DC

## 2023-12-27 NOTE — Progress Notes (Signed)
 Established patient visit   Patient: Cynthia Glover   DOB: Dec 04, 1990   32 y.o. Female  MRN: [id number] Visit Date: 12/27/2023  Today's healthcare provider: Sherlyn Hay, DO   Chief Complaint  Patient presents with   Obesity   Subjective    HPI Cynthia Glover is a 33 year old female with ADHD who presents with concerns about weight gain and medication management.  She is experiencing ongoing weight gain despite previous attempts at weight loss using medications such as Ozempic, Wegovy, and phentermine. Her private insurance has denied coverage for these medications.  She has been on Adderall extended release for ADHD for about two to three years, currently taking 30 mg daily. While Adderall initially helped with appetite suppression, this effect has diminished over time. She experiences increased hunger in the evenings, which she attributes to the medication wearing off. She recalls previously being prescribed an additional 10 mg of immediate release Adderall to manage this issue, but it was discontinued due to supply issues. She has also tried Vyvanse, which is indicated for binge eating disorder, but found that Adderall had a stronger appetite suppressant effect. She is currently managing her ADHD symptoms well with her current Adderall regimen, but is exploring options to better control her evening appetite and weight gain.     Medications: Outpatient Medications Prior to Visit  Medication Sig   amphetamine-dextroamphetamine (ADDERALL XR) 30 MG 24 hr capsule Take 1 capsule (30 mg total) by mouth daily.   Prenatal Vit-Fe Fumarate-FA (PRENATAL MULTIVITAMIN) TABS tablet Take 1 tablet by mouth daily at 12 noon.   [DISCONTINUED] ferrous sulfate 325 (65 FE) MG tablet Take 1 tablet (325 mg total) by mouth 2 (two) times daily with a meal.   [DISCONTINUED] Semaglutide-Weight Management (WEGOVY) 0.25 MG/0.5ML SOAJ Inject 0.25 mg into the skin once a week.   [DISCONTINUED]  Semaglutide-Weight Management (WEGOVY) 0.5 MG/0.5ML SOAJ Inject 0.5 mg into the skin once a week.   [DISCONTINUED] senna-docusate (SENOKOT-S) 8.6-50 MG tablet Take 2 tablets by mouth at bedtime as needed for mild constipation.   [DISCONTINUED] sertraline (ZOLOFT) 50 MG tablet Take 50 mg by mouth at bedtime.   [DISCONTINUED] simethicone (MYLICON) 80 MG chewable tablet Chew 1 tablet (80 mg total) by mouth 4 (four) times daily as needed for flatulence.   [DISCONTINUED] traZODone (DESYREL) 50 MG tablet Take 1 tablet by mouth at bedtime.   No facility-administered medications prior to visit.        Objective    BP 119/75   Pulse 75   Ht 5\' 7"  (1.702 m)   Wt 194 lb (88 kg)   BMI 30.38 kg/m     Physical Exam Vitals and nursing note reviewed.  Constitutional:      General: She is not in acute distress.    Appearance: Normal appearance.  HENT:     Head: Normocephalic and atraumatic.  Eyes:     General: No scleral icterus.    Conjunctiva/sclera: Conjunctivae normal.  Cardiovascular:     Rate and Rhythm: Normal rate.  Pulmonary:     Effort: Pulmonary effort is normal.  Neurological:     Mental Status: She is alert and oriented to person, place, and time. Mental status is at baseline.  Psychiatric:        Mood and Affect: Mood normal.        Behavior: Behavior normal.      No results found for any visits on 12/27/23.  Assessment &  Plan    Attention deficit hyperactivity disorder (ADHD), predominantly inattentive type Assessment & Plan: ADHD Symptoms well-controlled with current Adderall extended-release regimen (stable for 2-3 years). Discussed increasing dose but decided against it. Considered immediate-release Adderall in the afternoon for evening symptoms and appetite. - Continue current Adderall extended-release regimen - Add 10 mg immediate-release Adderall dose in the afternoon for evening symptoms and appetite  Orders: -     Amphetamine-Dextroamphetamine; Take 1  tablet (10 mg total) by mouth daily with lunch.  Dispense: 30 tablet; Refill: 0  Obesity (BMI 35.0-39.9 without comorbidity) Assessment & Plan: Persistent weight gain despite previous medications (Adderall, phentermine, Ozempic, Wegovy). Insurance issues have complicated access. Discussed options: orlistat (may cause uncontrollable fatty stools), topiramate (appetite suppression, may cause tiredness), Wellbutrin with naltrexone (targets addiction component of eating). Significant evening overeating possibly related to Adderall timing.  - Continue current Adderall extended-release regimen - Add 10 mg immediate-release Adderall dose in the afternoon for evening ADHD symptoms and appetite     Follow-up - Evaluate effectiveness of topiramate and adjusted Adderall regimen - Monitor insurance status and medication coverage.  Return in about 1 month (around 01/24/2024) for ADHD, weight.      I discussed the assessment and treatment plan with the patient  The patient was provided an opportunity to ask questions and all were answered. The patient agreed with the plan and demonstrated an understanding of the instructions.   The patient was advised to call back or seek an in-person evaluation if the symptoms worsen or if the condition fails to improve as anticipated.    Sherlyn Hay, DO  [facility] [address] [phone] (phone) [phone] (fax)  [facility]

## 2023-12-28 ENCOUNTER — Other Ambulatory Visit (HOSPITAL_COMMUNITY): Payer: Self-pay

## 2024-01-08 ENCOUNTER — Encounter: Payer: Self-pay | Admitting: Family Medicine

## 2024-01-08 NOTE — Assessment & Plan Note (Signed)
 Persistent weight gain despite previous medications (Adderall, phentermine, Ozempic, Wegovy). Insurance issues have complicated access. Discussed options: orlistat (may cause uncontrollable fatty stools), topiramate (appetite suppression, may cause tiredness), Wellbutrin with naltrexone (targets addiction component of eating). Significant evening overeating possibly related to Adderall timing.  - Continue current Adderall extended-release regimen - Add 10 mg immediate-release Adderall dose in the afternoon for evening ADHD symptoms and appetite

## 2024-01-08 NOTE — Assessment & Plan Note (Signed)
 ADHD Symptoms well-controlled with current Adderall extended-release regimen (stable for 2-3 years). Discussed increasing dose but decided against it. Considered immediate-release Adderall in the afternoon for evening symptoms and appetite. - Continue current Adderall extended-release regimen - Add 10 mg immediate-release Adderall dose in the afternoon for evening symptoms and appetite

## 2024-01-25 ENCOUNTER — Ambulatory Visit (INDEPENDENT_AMBULATORY_CARE_PROVIDER_SITE_OTHER): Payer: 59 | Admitting: Family Medicine

## 2024-01-25 ENCOUNTER — Encounter: Payer: Self-pay | Admitting: Family Medicine

## 2024-01-25 VITALS — BP 109/68 | HR 81 | Ht 67.0 in | Wt 194.7 lb

## 2024-01-25 DIAGNOSIS — F900 Attention-deficit hyperactivity disorder, predominantly inattentive type: Secondary | ICD-10-CM

## 2024-01-25 DIAGNOSIS — Z1159 Encounter for screening for other viral diseases: Secondary | ICD-10-CM | POA: Diagnosis not present

## 2024-01-25 DIAGNOSIS — R5382 Chronic fatigue, unspecified: Secondary | ICD-10-CM | POA: Diagnosis not present

## 2024-01-25 DIAGNOSIS — R4586 Emotional lability: Secondary | ICD-10-CM

## 2024-01-25 MED ORDER — AMPHETAMINE-DEXTROAMPHETAMINE 20 MG PO TABS: 20.0000 mg | ORAL_TABLET | Freq: Two times a day (BID) | ORAL | 0 refills | Status: DC

## 2024-01-25 NOTE — Progress Notes (Signed)
 Established patient visit   Patient: Cynthia Glover   DOB: July 13, 1991   33 y.o. Female  MRN: [id number] Visit Date: 01/25/2024  Today's healthcare provider: Sherlyn Hay, DO   Chief Complaint  Patient presents with   Medical Management of Chronic Issues    4 week follow-up. Pt was to add 10 mg immediate-release Adderall dose in the afternoon for evening symptoms and appetite. Pt reports taking medication as prescribed and tolerating well with no concerns. Reports she is almost out of rx and needing refill soon.   ADHD   Obesity   Subjective    HPI The patient presents with concerns about medication side effects and hormonal fluctuations.  She is currently on a regimen of 30 mg of Adderall in the morning and an additional 10 mg in the afternoon. She experiences headaches with the capsule form of the medication, which she describes as similar to 'caffeine headaches.' She has been on Adderall for about three years, starting at 20 mg and quickly increasing to 30 mg. The Adderall taken in the evenings helps with the 'acne crash' feeling but does not seem to affect her weight loss. Her weight has remained stable at 194 pounds since her last visit.  She is experiencing hormonal fluctuations, particularly around her menstrual cycle. She describes a significant drop in energy levels and mood changes around ovulation and the week before her period. Her periods have been irregular since having her child, with her cycle gradually shifting back by a week each time. She feels her best during the second week after her period ends, with symptoms like mood swings and gastrointestinal issues occurring around her cycle.  She recently received a birth control implant, which she describes as painful, particularly during the numbing process. She has not had a Pap smear in about two years, with the last one possibly being in 2021. She has not received a COVID booster.     Medications: Outpatient  Medications Prior to Visit  Medication Sig   Prenatal Vit-Fe Fumarate-FA (PRENATAL MULTIVITAMIN) TABS tablet Take 1 tablet by mouth daily at 12 noon.   [DISCONTINUED] amphetamine-dextroamphetamine (ADDERALL) 10 MG tablet Take 1 tablet (10 mg total) by mouth daily with lunch.   [DISCONTINUED] amphetamine-dextroamphetamine (ADDERALL XR) 30 MG 24 hr capsule Take 1 capsule (30 mg total) by mouth daily.   No facility-administered medications prior to visit.        Objective    BP 109/68 (BP Location: Right Arm, Patient Position: Sitting)   Pulse 81   Ht 5\' 7"  (1.702 m)   Wt 194 lb 11.2 oz (88.3 kg)   SpO2 98%   BMI 30.49 kg/m     Physical Exam Vitals and nursing note reviewed.  Constitutional:      General: She is not in acute distress.    Appearance: Normal appearance.  HENT:     Head: Normocephalic and atraumatic.  Eyes:     General: No scleral icterus.    Conjunctiva/sclera: Conjunctivae normal.  Cardiovascular:     Rate and Rhythm: Normal rate.  Pulmonary:     Effort: Pulmonary effort is normal.  Neurological:     Mental Status: She is alert and oriented to person, place, and time. Mental status is at baseline.  Psychiatric:        Mood and Affect: Mood normal.        Behavior: Behavior normal.       Assessment & Plan    Attention  deficit hyperactivity disorder (ADHD), predominantly inattentive type Assessment & Plan: She experiences headaches with the current extended-release Adderall 30 mg regimen, particularly in the evenings, similar to caffeine headaches. She is not experiencing weight loss but is not gaining weight either. Discussed switching to immediate-release Adderall to manage headaches and adjust dosing schedule, as immediate-release requires careful adjustment due to shorter duration and need to manage total daily dose to avoid underdosing in the afternoon. - Switch to Adderall immediate-release 20 mg twice a day. - Monitor response to the new regimen  and adjust as needed. - Finish current supply of extended-release before starting the new regimen. - Follow up in one month to assess the effectiveness of the new dosing schedule.  Orders: -     Amphetamine-Dextroamphetamine; Take 1 tablet (20 mg total) by mouth 2 (two) times daily.  Dispense: 60 tablet; Refill: 0  Labile mood Assessment & Plan: She reports symptoms potentially related to hormonal fluctuations, including fatigue, mood changes, and gastrointestinal symptoms around ovulation and menstruation. She is interested in testing hormone levels to determine if an imbalance contributes to these symptoms. Timing of the test is important due to hormone level fluctuations throughout the cycle. - Order hormone level testing to assess for potential imbalances.   Chronic fatigue Assessment & Plan: Addressed as noted above.   Orders: -     Hormone Panel -     FSH/LH -     Progesterone -     Estradiol  Encounter for hepatitis C screening test for low risk patient -     HCV Ab w Reflex to Quant PCR -     Interpretation:   General Health Maintenance She has not received a COVID booster and is due for a Pap smear. She recently received birth control and reported pain during the procedure. She is aware of the potential for not having a period due to birth control. - Declined COVID booster. - Schedule Pap smear. - Discuss birth control side effects and management options.   Return in about 4 weeks (around 02/22/2024) for ADHD.      I discussed the assessment and treatment plan with the patient  The patient was provided an opportunity to ask questions and all were answered. The patient agreed with the plan and demonstrated an understanding of the instructions.   The patient was advised to call back or seek an in-person evaluation if the symptoms worsen or if the condition fails to improve as anticipated.    Sherlyn Hay, DO  [facility] [phone]  (phone) [phone] (fax)  [facility]

## 2024-01-25 NOTE — Assessment & Plan Note (Signed)
 She experiences headaches with the current extended-release Adderall 30 mg regimen, particularly in the evenings, similar to caffeine headaches. She is not experiencing weight loss but is not gaining weight either. Discussed switching to immediate-release Adderall to manage headaches and adjust dosing schedule, as immediate-release requires careful adjustment due to shorter duration and need to manage total daily dose to avoid underdosing in the afternoon. - Switch to Adderall immediate-release 20 mg twice a day. - Monitor response to the new regimen and adjust as needed. - Finish current supply of extended-release before starting the new regimen. - Follow up in one month to assess the effectiveness of the new dosing schedule.

## 2024-01-30 ENCOUNTER — Encounter: Payer: Self-pay | Admitting: Family Medicine

## 2024-01-30 ENCOUNTER — Other Ambulatory Visit (HOSPITAL_COMMUNITY): Payer: Self-pay

## 2024-02-01 ENCOUNTER — Other Ambulatory Visit (HOSPITAL_COMMUNITY): Payer: Self-pay

## 2024-02-01 ENCOUNTER — Telehealth: Payer: Self-pay | Admitting: Pharmacy Technician

## 2024-02-01 NOTE — Telephone Encounter (Signed)
 Pharmacy Patient Advocate Encounter   Received notification from Onbase that prior authorization for Amphetamine-Dextroamphetamine 20MG  Tablets is required/requested.   Insurance verification completed.   The patient is insured through [employer] [facility] .   Per test claim: PA required; PA submitted to above mentioned insurance via CoverMyMeds Key/confirmation #/EOC [id number] Status is pending

## 2024-02-01 NOTE — Telephone Encounter (Signed)
 Pharmacy Patient Advocate Encounter  Received notification from [employer] that Prior Authorization for Amphetamine-Dextroamphetamine 20MG  TABLETS has been APPROVED from 02/01/2024 to 02/01/2027. Ran test claim, Copay is $5.00. This test claim was processed through [facility]- copay amounts may vary at other pharmacies due to pharmacy/plan contracts, or as the patient moves through the different stages of their insurance plan.   PA #/Case ID/Reference #: [id number]

## 2024-02-01 NOTE — Telephone Encounter (Signed)
 Pharmacy Patient Advocate Encounter   Received notification from Onbase that prior authorization for Amphetamine-Dextroamphetamine 20MG  tablets is required/requested.   Insurance verification completed.   The patient is insured through [employer] [facility] .   Per test claim: PA required; PA started via CoverMyMeds. KEY [id number] . Waiting for clinical questions to populate.

## 2024-02-03 ENCOUNTER — Encounter: Payer: Self-pay | Admitting: Family Medicine

## 2024-02-03 DIAGNOSIS — R4586 Emotional lability: Secondary | ICD-10-CM | POA: Insufficient documentation

## 2024-02-03 NOTE — Assessment & Plan Note (Signed)
 She reports symptoms potentially related to hormonal fluctuations, including fatigue, mood changes, and gastrointestinal symptoms around ovulation and menstruation. She is interested in testing hormone levels to determine if an imbalance contributes to these symptoms. Timing of the test is important due to hormone level fluctuations throughout the cycle. - Order hormone level testing to assess for potential imbalances.

## 2024-02-03 NOTE — Assessment & Plan Note (Signed)
 Addressed as noted above.

## 2024-02-09 LAB — HORMONE PANEL (T4,TSH,FSH,TESTT,SHBG,DHEA,ETC)
DHEA-Sulfate, LCMS: 198 ug/dL
Estradiol, Serum, MS: 56 pg/mL
Estrone Sulfate: 69 ng/dL
Follicle Stimulating Hormone: 2.7 m[IU]/mL
Free T-3: 3.1 pg/mL
Free Testosterone, Serum: 3.2 pg/mL
Progesterone, Serum: 258 ng/dL
Sex Hormone Binding Globulin: 24.6 nmol/L
T4: 8.5 ug/dL
TSH: 1.5 uU/mL
Testosterone, Serum (Total): 15 ng/dL
Testosterone-% Free: 2.1 %
Triiodothyronine (T-3), Serum: 95 ng/dL

## 2024-02-09 LAB — FSH/LH
FSH: 2.3 m[IU]/mL
LH: 3.2 m[IU]/mL

## 2024-02-09 LAB — HCV AB W REFLEX TO QUANT PCR: HCV Ab: NONREACTIVE

## 2024-02-09 LAB — ESTRADIOL: Estradiol: 51.2 pg/mL

## 2024-02-09 LAB — PROGESTERONE: Progesterone: 2.6 ng/mL

## 2024-02-09 LAB — HCV INTERPRETATION

## 2024-02-20 ENCOUNTER — Encounter: Payer: Self-pay | Admitting: Family Medicine

## 2024-03-02 ENCOUNTER — Ambulatory Visit: Admitting: Family Medicine

## 2024-04-03 ENCOUNTER — Encounter: Payer: Self-pay | Admitting: Family Medicine

## 2024-04-03 DIAGNOSIS — F900 Attention-deficit hyperactivity disorder, predominantly inattentive type: Secondary | ICD-10-CM

## 2024-04-04 ENCOUNTER — Other Ambulatory Visit: Payer: Self-pay

## 2024-04-10 ENCOUNTER — Other Ambulatory Visit: Payer: Self-pay | Admitting: Family Medicine

## 2024-04-10 DIAGNOSIS — F900 Attention-deficit hyperactivity disorder, predominantly inattentive type: Secondary | ICD-10-CM

## 2024-04-10 NOTE — Telephone Encounter (Signed)
 Copied from CRM [id number]. Topic: Clinical - Medication Refill >> Apr 10, 2024 11:32 AM Everette C wrote: Medication: amphetamine -dextroamphetamine  (ADDERALL ) 20 MG tablet [[id number]]  Has the patient contacted their pharmacy? Yes (Agent: If no, request that the patient contact the pharmacy for the refill. If patient does not wish to contact the pharmacy document the reason why and proceed with request.) (Agent: If yes, when and what did the pharmacy advise?)  This is the patient's preferred pharmacy:  [facility] [address], [place] - [address] [place] [address] Phone: [phone] Fax: [phone]   Is this the correct pharmacy for this prescription? Yes If no, delete pharmacy and type the correct one.   Has the prescription been filled recently? Yes  Is the patient out of the medication? No  Has the patient been seen for an appointment in the last year OR does the patient have an upcoming appointment? Yes  Can we respond through MyChart? No  Agent: Please be advised that Rx refills may take up to 3 business days. We ask that you follow-up with your pharmacy.

## 2024-04-12 NOTE — Telephone Encounter (Signed)
 Requested medication (s) are due for refill today: yes  Requested medication (s) are on the active medication list: yes  Last refill:  01/25/24  Future visit scheduled: no  Notes to clinic:  Unable to refill per protocol, cannot delegate.      Requested Prescriptions  Pending Prescriptions Disp Refills   amphetamine -dextroamphetamine  (ADDERALL ) 20 MG tablet 60 tablet 0    Sig: Take 1 tablet (20 mg total) by mouth 2 (two) times daily.     Not Delegated - Psychiatry:  Stimulants/ADHD Failed - 04/12/2024  4:13 PM      Failed - This refill cannot be delegated      Failed - Urine Drug Screen completed in last 360 days      Passed - Last BP in normal range    BP Readings from Last 1 Encounters:  01/25/24 109/68         Passed - Last Heart Rate in normal range    Pulse Readings from Last 1 Encounters:  01/25/24 81         Passed - Valid encounter within last 6 months    Recent Outpatient Visits           2 months ago Attention deficit hyperactivity disorder (ADHD), predominantly inattentive type   [facility] [place], Asencion Blacksmith, DO   3 months ago Attention deficit hyperactivity disorder (ADHD), predominantly inattentive type   [facility] [place], Asencion Blacksmith, DO

## 2024-04-16 MED ORDER — AMPHETAMINE-DEXTROAMPHETAMINE 20 MG PO TABS: 20.0000 mg | ORAL_TABLET | Freq: Two times a day (BID) | ORAL | 0 refills | Status: DC

## 2024-06-22 MED ORDER — AMPHETAMINE-DEXTROAMPHETAMINE 20 MG PO TABS: 20.0000 mg | ORAL_TABLET | Freq: Two times a day (BID) | ORAL | 0 refills | Status: DC

## 2024-09-01 ENCOUNTER — Encounter: Payer: Self-pay | Admitting: Family Medicine

## 2024-09-03 ENCOUNTER — Other Ambulatory Visit: Payer: Self-pay

## 2024-09-03 DIAGNOSIS — F900 Attention-deficit hyperactivity disorder, predominantly inattentive type: Secondary | ICD-10-CM

## 2024-09-06 ENCOUNTER — Other Ambulatory Visit: Payer: Self-pay

## 2024-09-06 MED ORDER — AMPHETAMINE-DEXTROAMPHETAMINE 20 MG PO TABS: 20.0000 mg | ORAL_TABLET | Freq: Two times a day (BID) | ORAL | 0 refills | Status: DC

## 2024-09-10 ENCOUNTER — Telehealth: Payer: Self-pay | Admitting: Family Medicine

## 2024-09-13 NOTE — Telephone Encounter (Signed)
 Cynthia Glover returned my call, while still charting.  Information given to client regarding [place] [employer]: Scientist, Physiological.  Client given link for the website and reviewed information.  Client will download the form and follow up with the clerk of court.  No further questions.    .me

## 2024-11-06 ENCOUNTER — Other Ambulatory Visit: Payer: Self-pay

## 2024-11-06 ENCOUNTER — Encounter: Payer: Self-pay | Admitting: Family Medicine

## 2024-11-06 DIAGNOSIS — F900 Attention-deficit hyperactivity disorder, predominantly inattentive type: Secondary | ICD-10-CM

## 2024-11-06 NOTE — Telephone Encounter (Signed)
 LOV 01/25/24 NOV none LRF 09/06/24

## 2024-11-07 MED ORDER — AMPHETAMINE-DEXTROAMPHETAMINE 20 MG PO TABS: 20.0000 mg | ORAL_TABLET | Freq: Two times a day (BID) | ORAL | 0 refills | Status: AC

## 2024-12-04 ENCOUNTER — Encounter: Admitting: Family Medicine

## 2024-12-11 ENCOUNTER — Encounter: Admitting: Family Medicine

## 2024-12-11 DIAGNOSIS — Z124 Encounter for screening for malignant neoplasm of cervix: Secondary | ICD-10-CM

## 2024-12-11 DIAGNOSIS — Z0000 Encounter for general adult medical examination without abnormal findings: Secondary | ICD-10-CM

## 2024-12-11 DIAGNOSIS — Z1159 Encounter for screening for other viral diseases: Secondary | ICD-10-CM

## 2024-12-11 DIAGNOSIS — F900 Attention-deficit hyperactivity disorder, predominantly inattentive type: Secondary | ICD-10-CM

## 2024-12-11 DIAGNOSIS — F410 Panic disorder [episodic paroxysmal anxiety] without agoraphobia: Secondary | ICD-10-CM

## 2024-12-11 DIAGNOSIS — R5382 Chronic fatigue, unspecified: Secondary | ICD-10-CM

## 2024-12-11 DIAGNOSIS — E669 Obesity, unspecified: Secondary | ICD-10-CM

## 2024-12-11 DIAGNOSIS — G43101 Migraine with aura, not intractable, with status migrainosus: Secondary | ICD-10-CM

## 2024-12-11 DIAGNOSIS — Z23 Encounter for immunization: Secondary | ICD-10-CM

## 2025-01-15 ENCOUNTER — Encounter: Admitting: Family Medicine
